# Patient Record
Sex: Female | Born: 1970 | Race: Black or African American | Hispanic: No | Smoking: Never smoker
Health system: Southern US, Community
[De-identification: ages and names within clinical notes are randomized; demographics above are authoritative.]

## PROBLEM LIST (undated history)

## (undated) DIAGNOSIS — M51369 Other intervertebral disc degeneration, lumbar region without mention of lumbar back pain or lower extremity pain: Secondary | ICD-10-CM

## (undated) DIAGNOSIS — R600 Localized edema: Secondary | ICD-10-CM

## (undated) DIAGNOSIS — R0600 Dyspnea, unspecified: Secondary | ICD-10-CM

## (undated) DIAGNOSIS — F329 Major depressive disorder, single episode, unspecified: Secondary | ICD-10-CM

## (undated) DIAGNOSIS — R0789 Other chest pain: Secondary | ICD-10-CM

## (undated) DIAGNOSIS — R030 Elevated blood-pressure reading, without diagnosis of hypertension: Secondary | ICD-10-CM

## (undated) DIAGNOSIS — J45909 Unspecified asthma, uncomplicated: Secondary | ICD-10-CM

## (undated) DIAGNOSIS — I10 Essential (primary) hypertension: Secondary | ICD-10-CM

## (undated) DIAGNOSIS — F32A Depression, unspecified: Secondary | ICD-10-CM

## (undated) DIAGNOSIS — I890 Lymphedema, not elsewhere classified: Secondary | ICD-10-CM

## (undated) HISTORY — DX: Unspecified asthma, uncomplicated: J45.909

## (undated) HISTORY — PX: MOUTH SURGERY: SHX715

## (undated) HISTORY — PX: TUBAL LIGATION: SHX77

## (undated) HISTORY — DX: Major depressive disorder, single episode, unspecified: F32.9

## (undated) HISTORY — DX: Depression, unspecified: F32.A

---

## 2003-11-05 ENCOUNTER — Ambulatory Visit: Payer: Self-pay | Admitting: Advanced Practice Midwife

## 2004-04-29 ENCOUNTER — Inpatient Hospital Stay: Payer: Self-pay | Admitting: Unknown Physician Specialty

## 2006-08-03 ENCOUNTER — Emergency Department: Payer: Self-pay | Admitting: Emergency Medicine

## 2006-11-11 ENCOUNTER — Encounter: Payer: Self-pay | Admitting: Maternal & Fetal Medicine

## 2006-11-11 IMAGING — US US OB DETAIL+14 WK - NRPT [facility]
1 series · 14 of 28 positions shown · non-contrast
Comparison: none

[Series 1: us ob detail+14 wk - nrpt [facility] · 0.35mm/px · 14 of 65 slices shown]
[im 3/65]
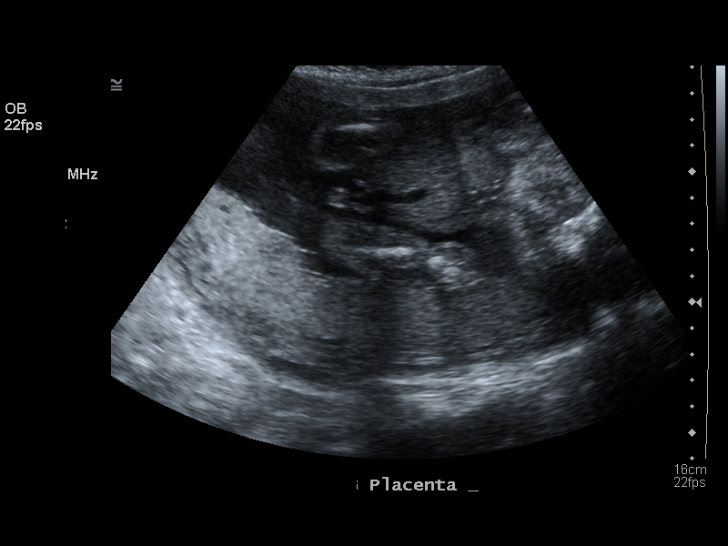
[im 8/65]
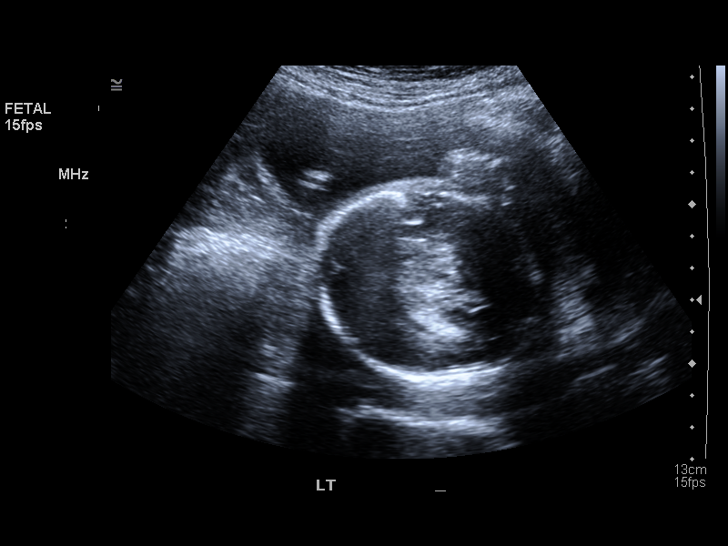
[im 12/65]
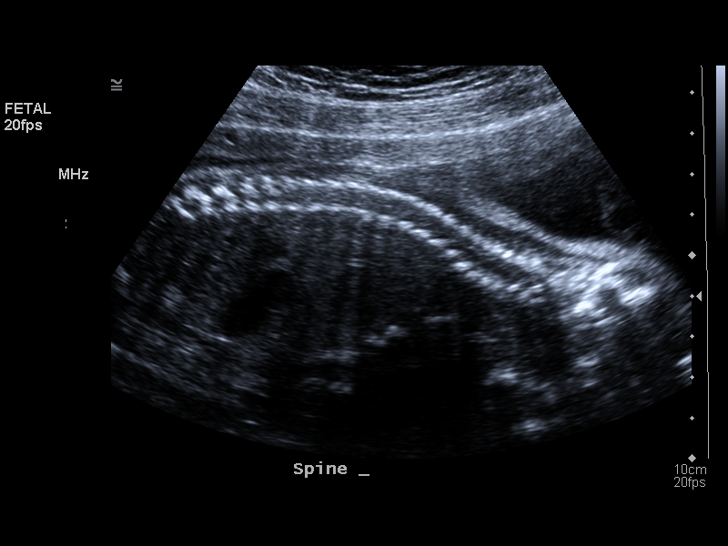
[im 17/65]
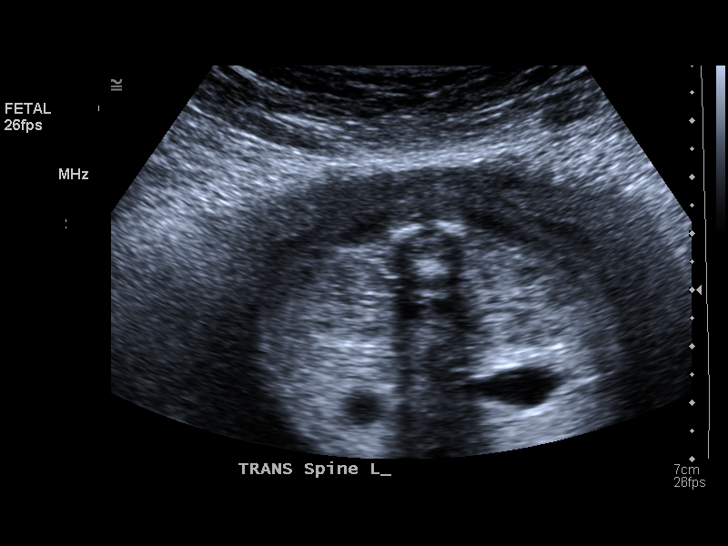
[im 22/65]
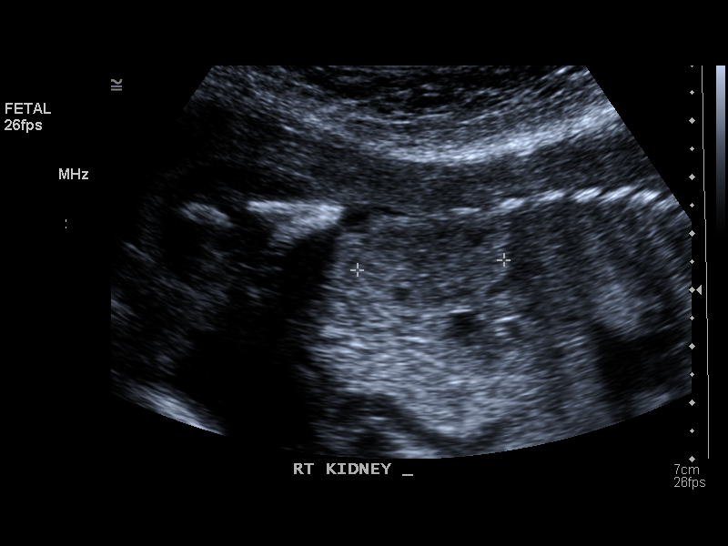
[im 27/65]
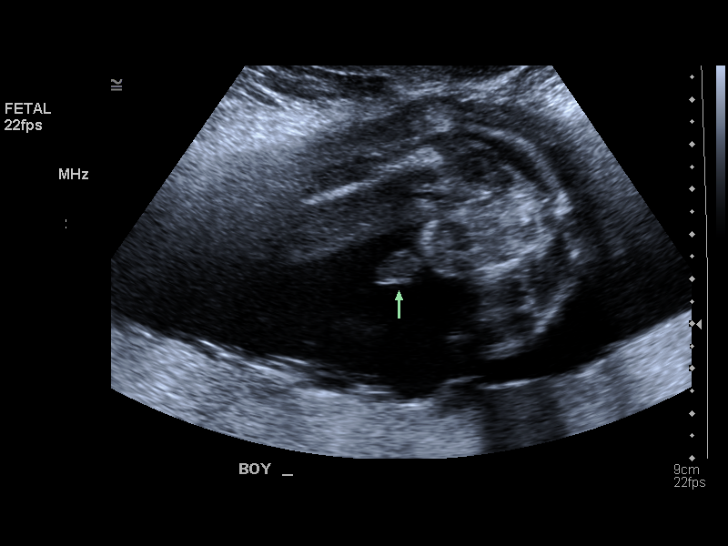
[im 31/65]
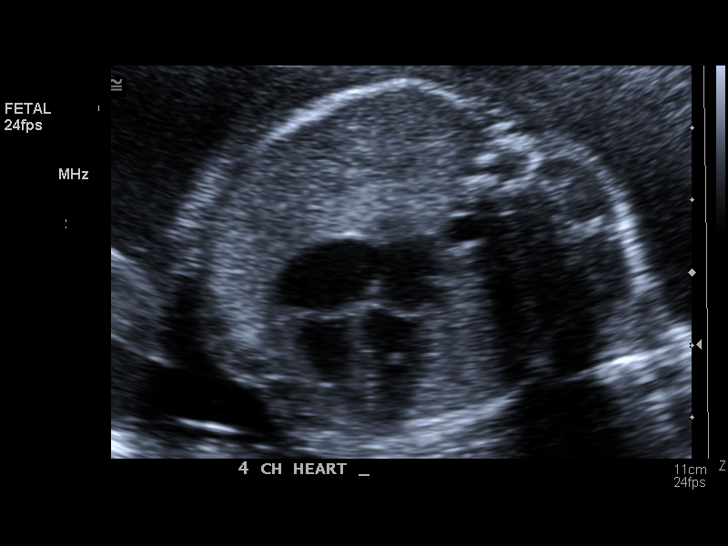
[im 36/65]
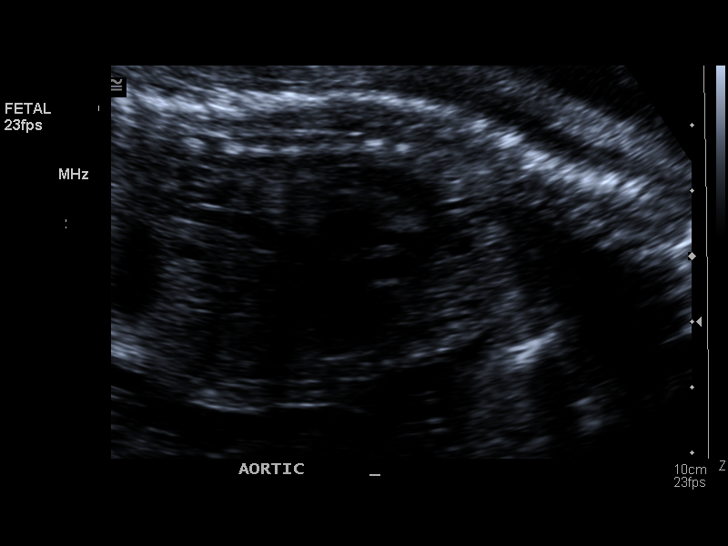
[im 41/65]
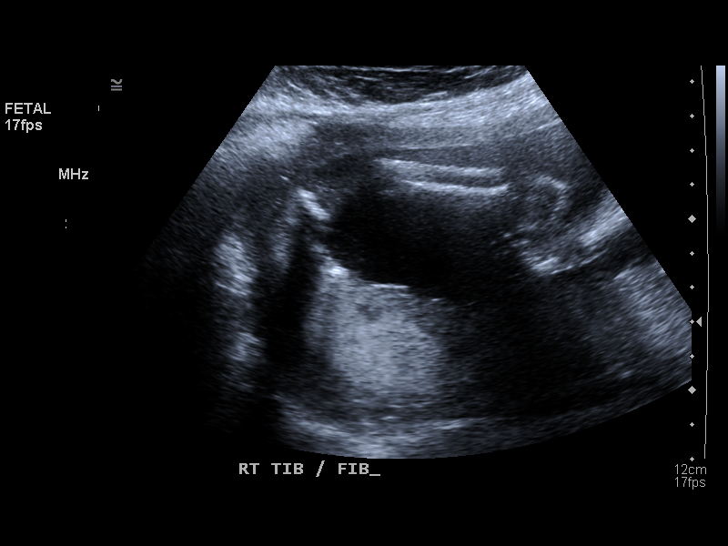
[im 46/65]
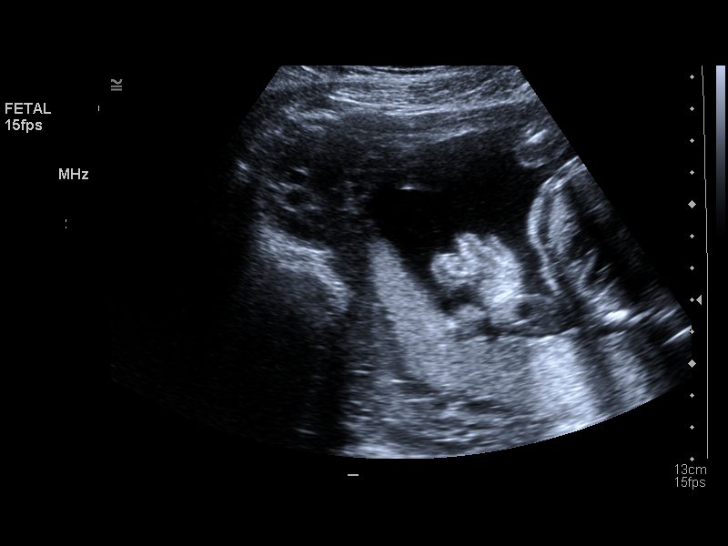
[im 50/65]
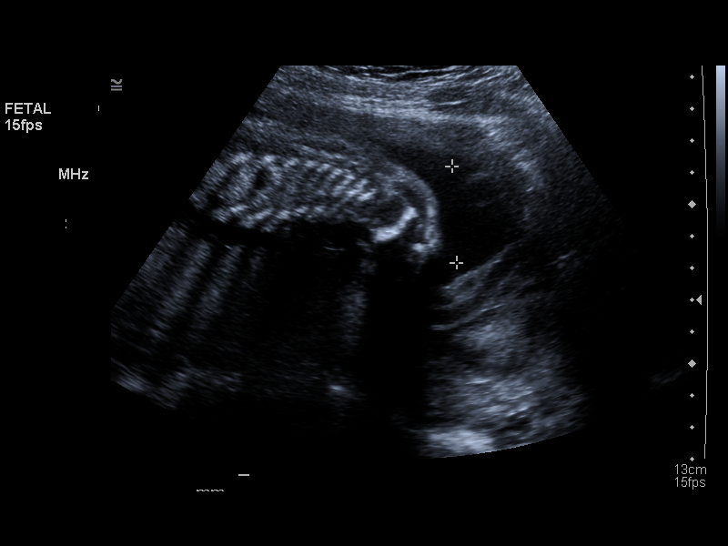
[im 55/65]
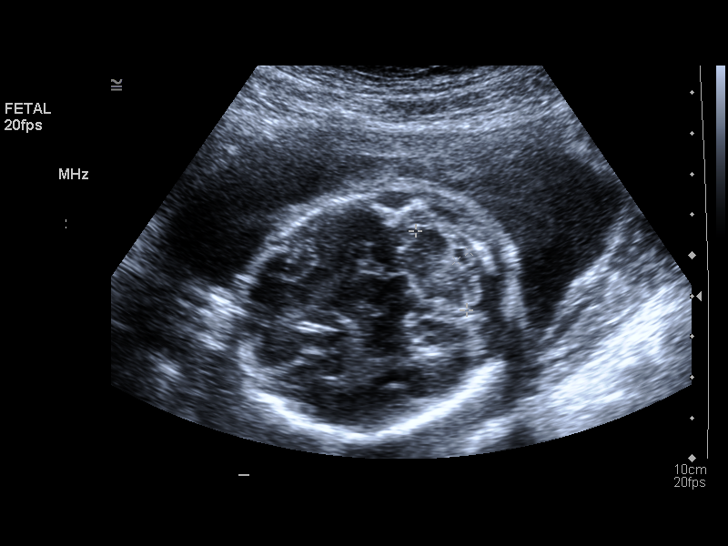
[im 60/65]
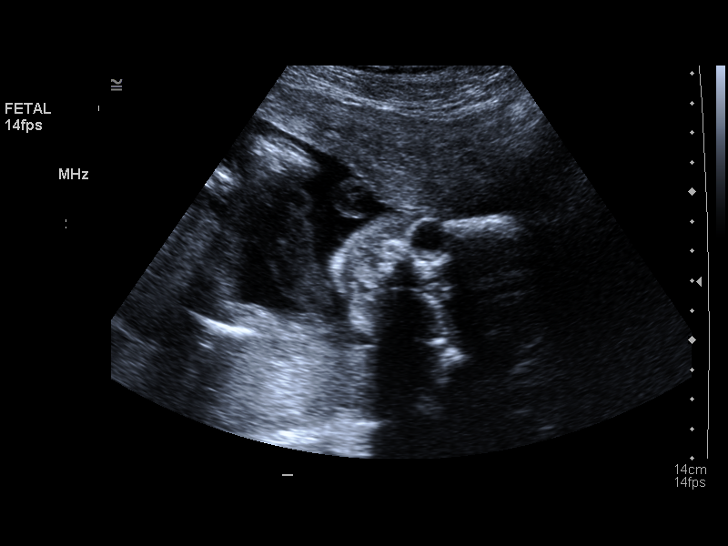
[im 65/65]
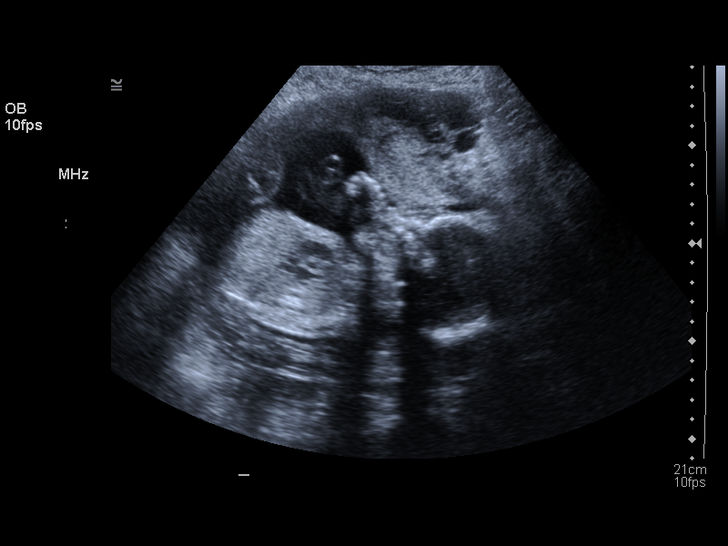

[14 of 28 positions shown; findings below may reference images not displayed]

IMAGES IMPORTED FROM THE SYNGO WORKFLOW SYSTEM
NO DICTATION FOR STUDY

## 2007-02-03 ENCOUNTER — Encounter: Payer: Self-pay | Admitting: Maternal & Fetal Medicine

## 2007-02-03 IMAGING — US ULTRAOUND OB LIMITED - NRPT [facility]
1 series · 14 of 28 positions shown · non-contrast
Comparison: none

[Series 1: ultraound ob limited - nrpt [facility] · 0.29mm/px · 14 of 35 slices shown]
[im 2/35]
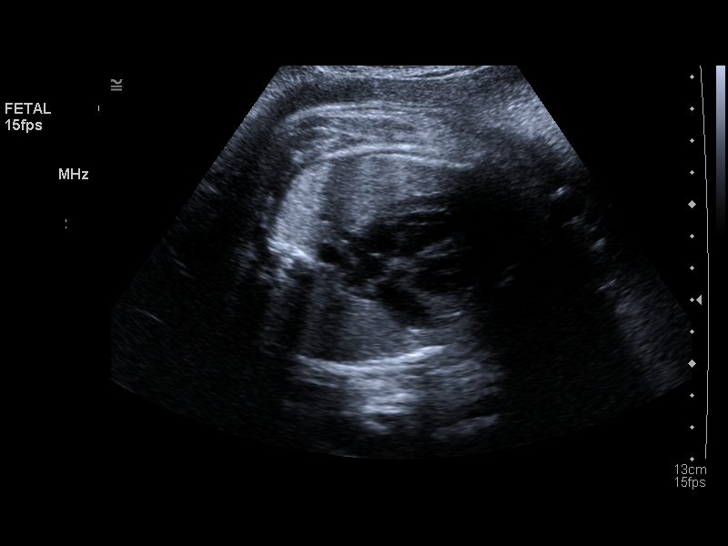
[im 4/35]
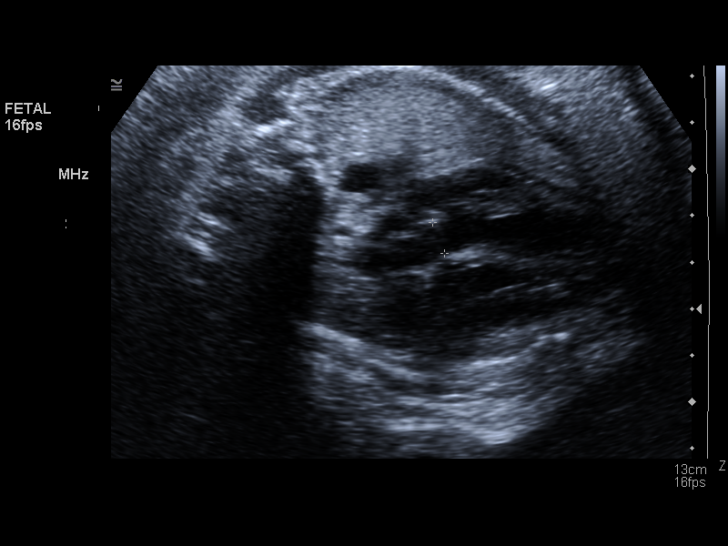
[im 7/35]
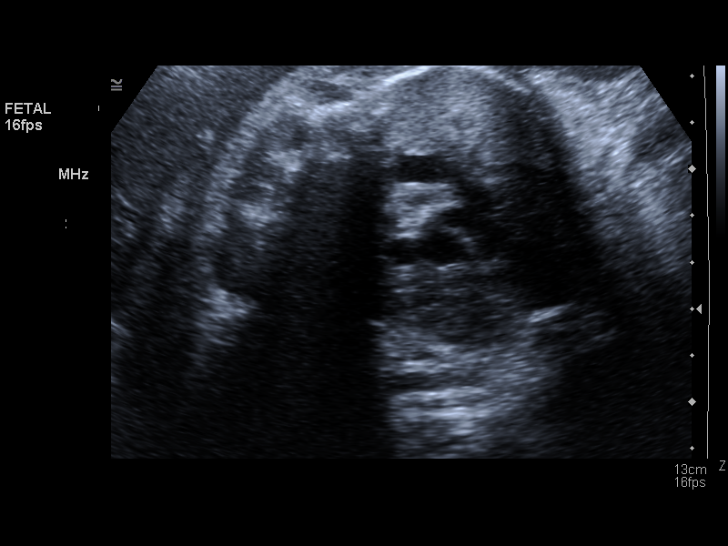
[im 9/35]
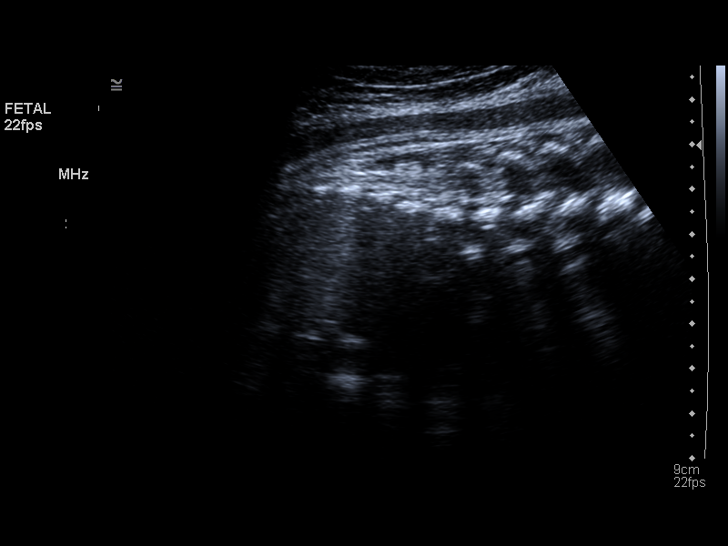
[im 12/35]
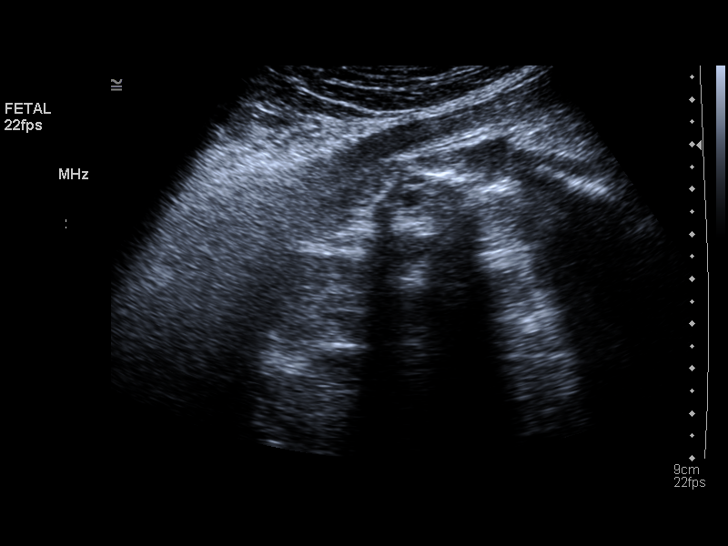
[im 14/35]
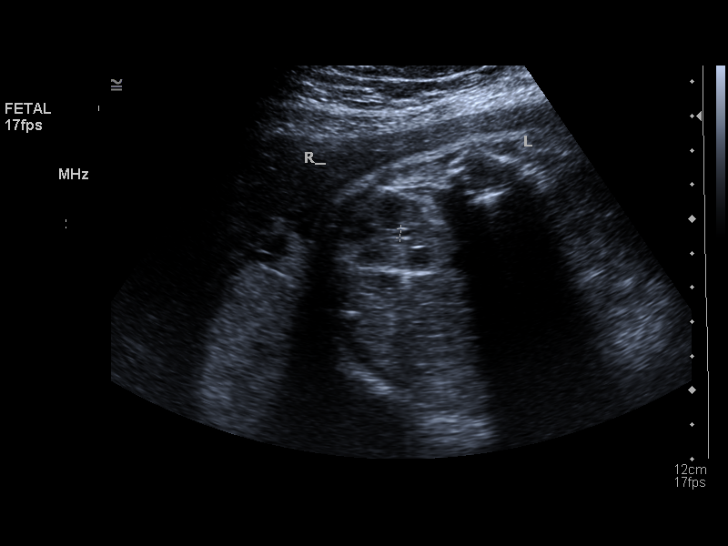
[im 17/35]
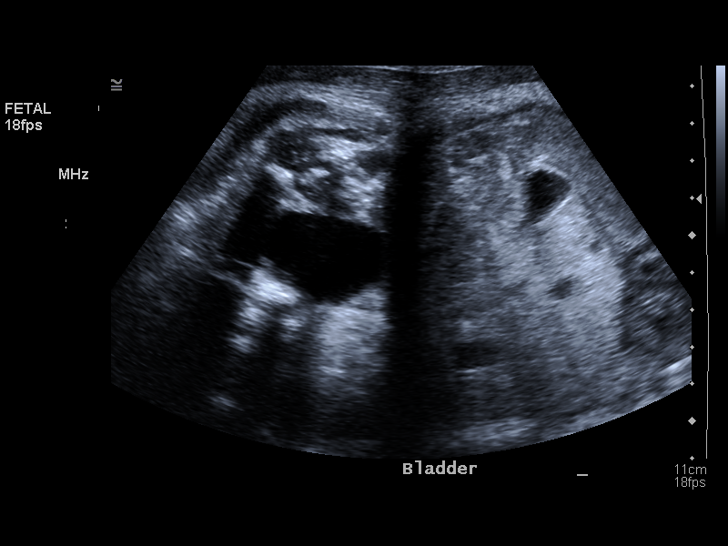
[im 19/35]
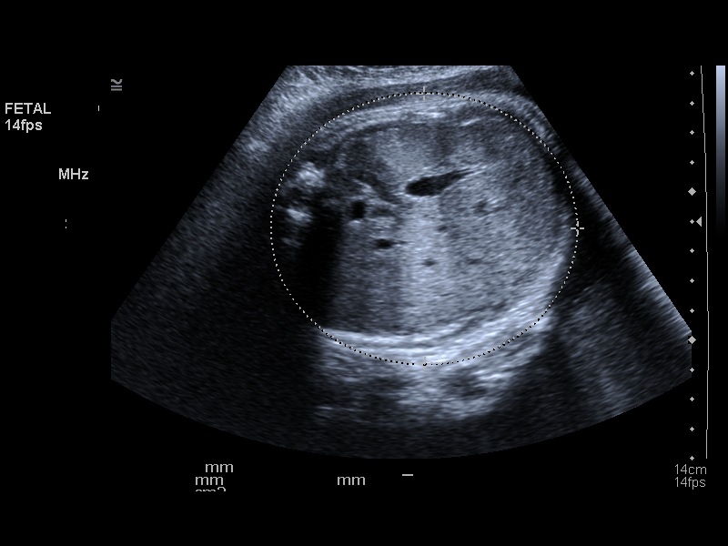
[im 22/35]
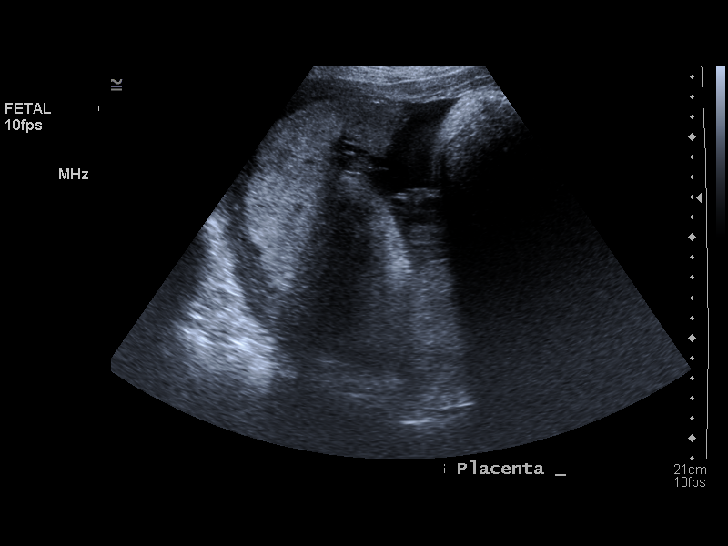
[im 24/35]
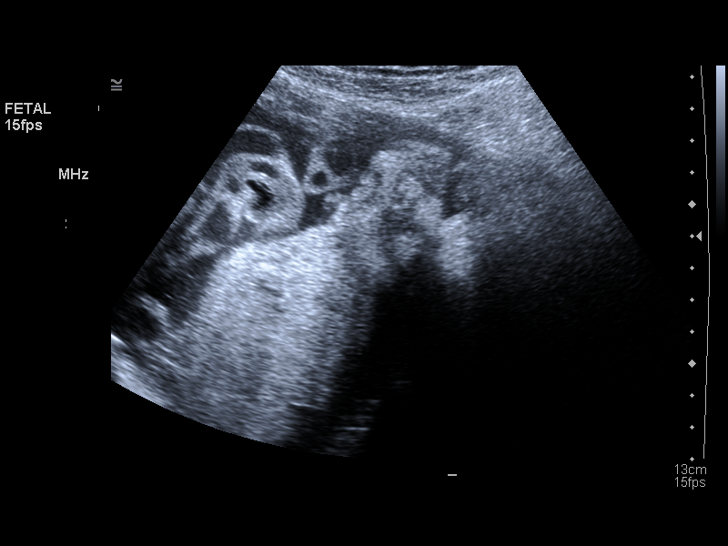
[im 27/35]
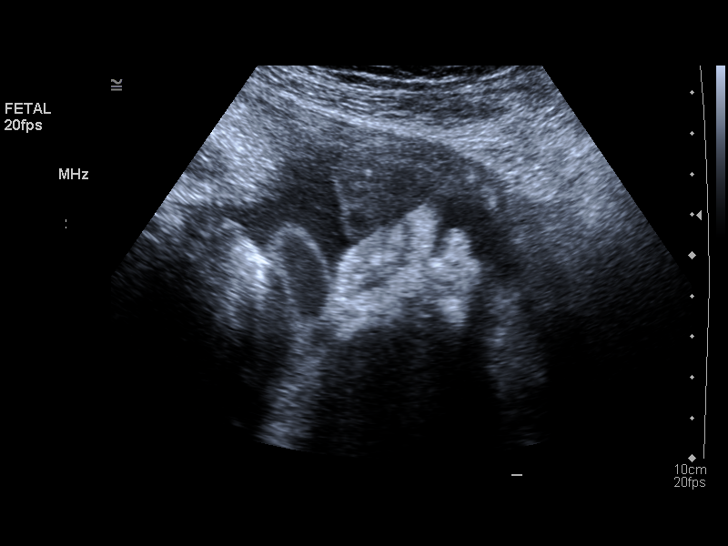
[im 29/35]
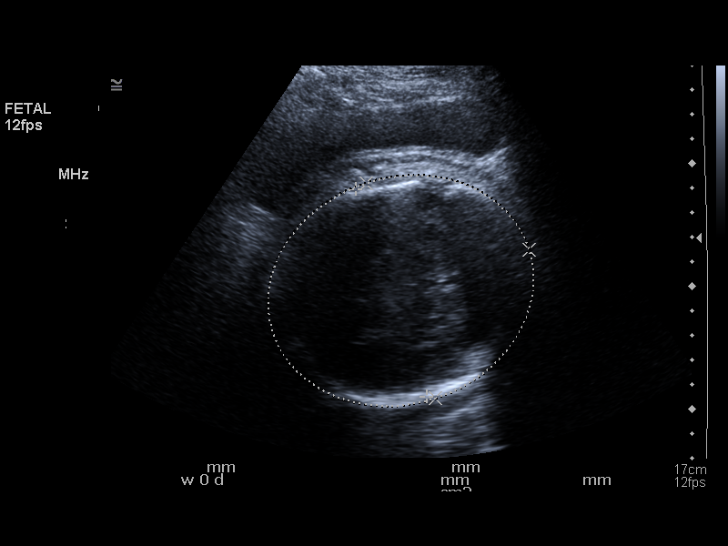
[im 32/35]
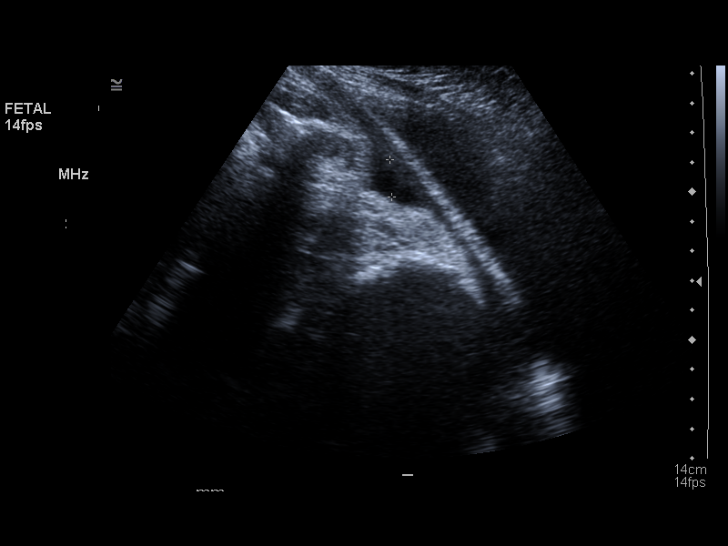
[im 35/35]
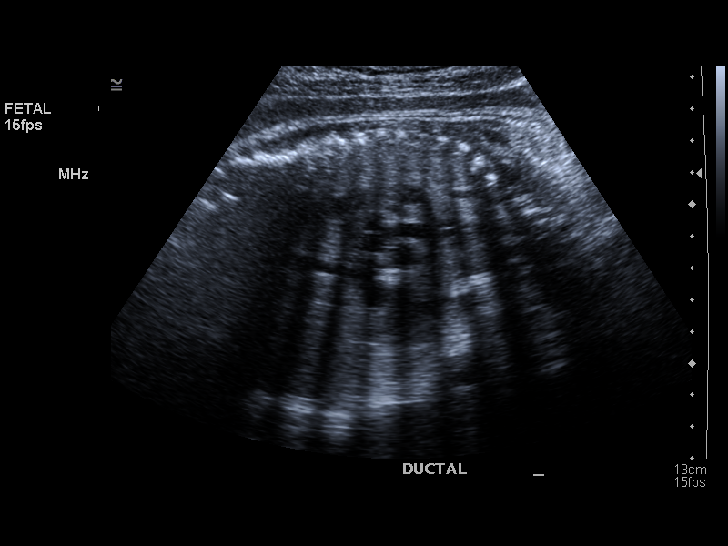

[14 of 28 positions shown; findings below may reference images not displayed]

IMAGES IMPORTED FROM THE SYNGO WORKFLOW SYSTEM
NO DICTATION FOR STUDY

## 2007-02-10 ENCOUNTER — Observation Stay: Payer: Self-pay | Admitting: Obstetrics & Gynecology

## 2007-02-10 ENCOUNTER — Encounter: Payer: Self-pay | Admitting: Maternal & Fetal Medicine

## 2007-02-10 IMAGING — US US FETAL BPP W/ NON-STRESS - NRPT
1 series · 7 of 7 positions shown · non-contrast
Comparison: none

[Series 1: us fetal bpp w/ non-stress - nrpt · 7 of 7 slices shown]
[im 1/7]
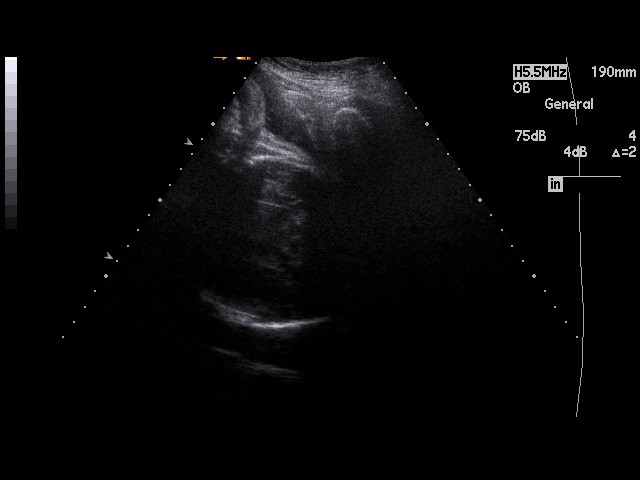
[im 2/7]
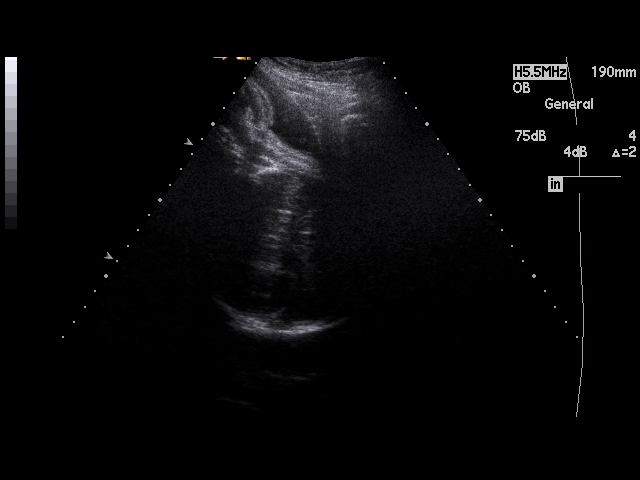
[im 3/7]
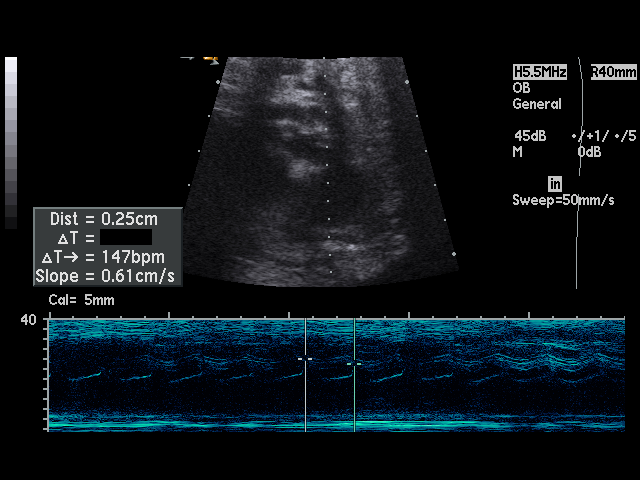
[im 4/7]
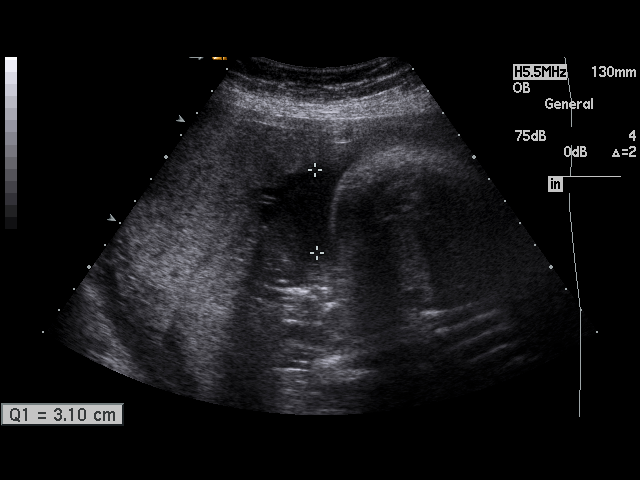
[im 5/7]
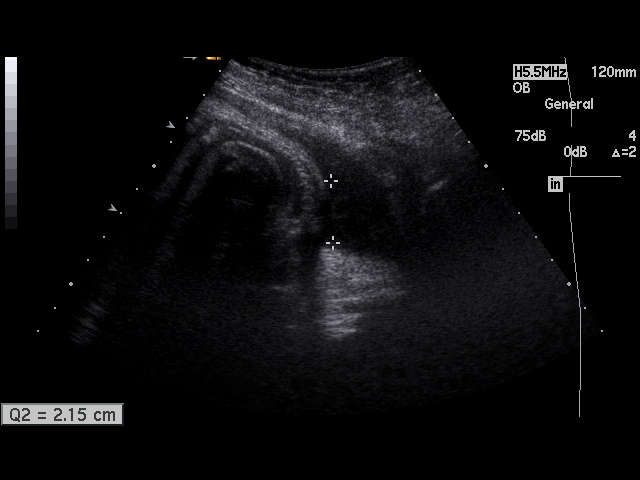
[im 6/7]
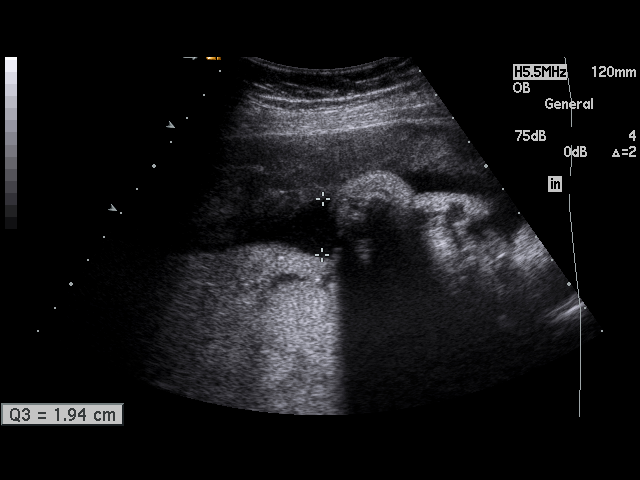
[im 7/7]
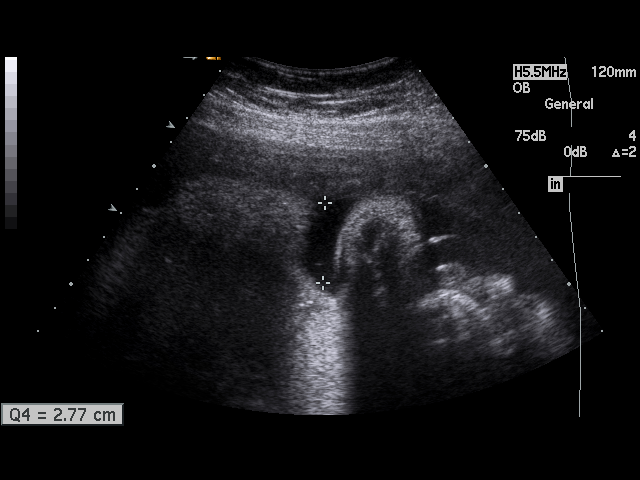

[7 of 7 positions shown; findings below may reference images not displayed]

IMAGES IMPORTED FROM THE SYNGO WORKFLOW SYSTEM
NO DICTATION FOR STUDY

## 2007-02-20 ENCOUNTER — Encounter: Payer: Self-pay | Admitting: Maternal & Fetal Medicine

## 2007-02-20 ENCOUNTER — Observation Stay: Payer: Self-pay | Admitting: Unknown Physician Specialty

## 2007-02-20 IMAGING — US ULTRAOUND OB LIMITED - [facility]PT [facility]
1 series · 14 of 28 positions shown · non-contrast
Comparison: none

[Series 1: ultraound ob limited - nrpt [facility] · 0.43mm/px · 14 of 28 slices shown]
[im 2/28]
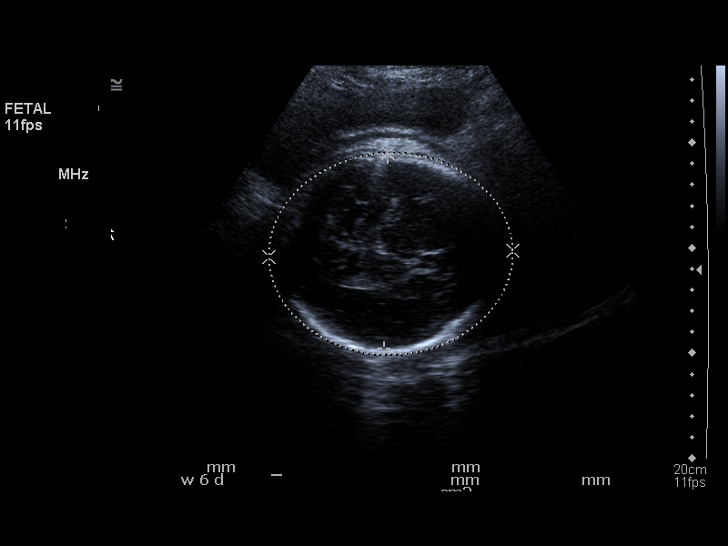
[im 4/28]
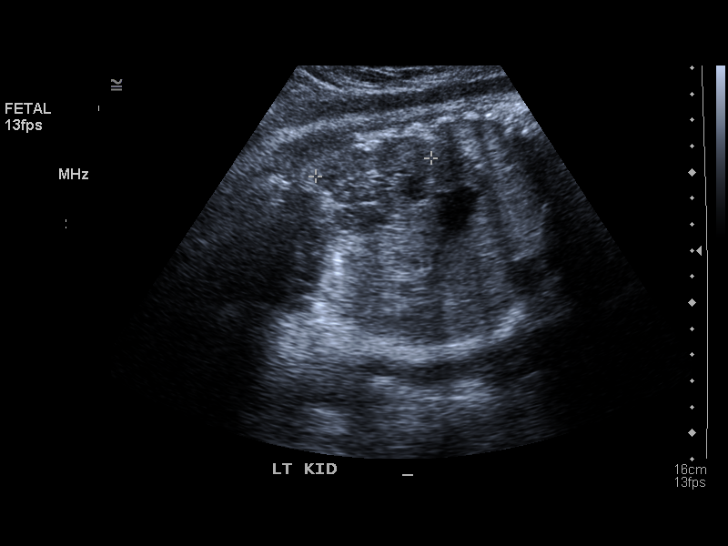
[im 6/28]
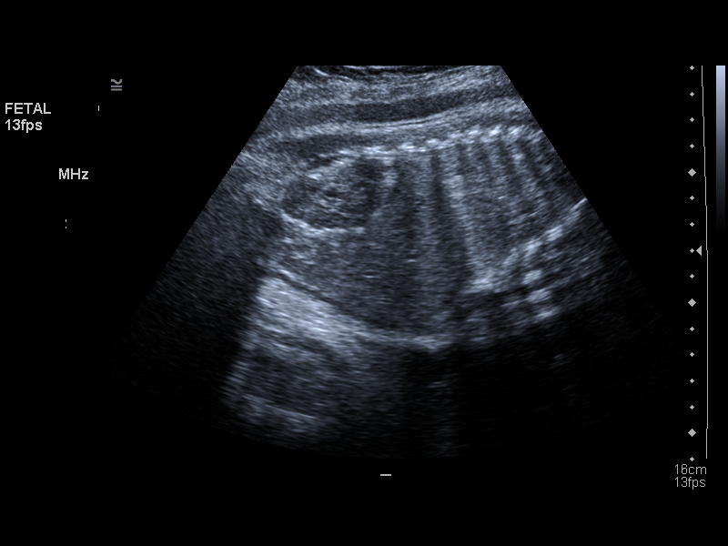
[im 8/28]
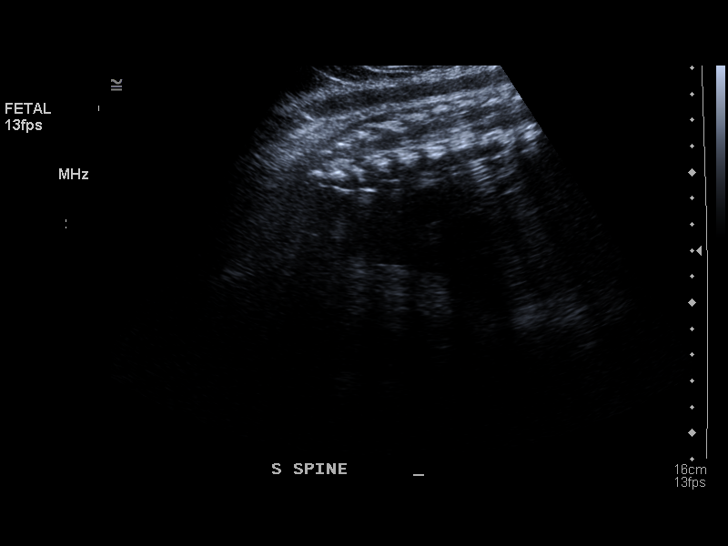
[im 10/28]
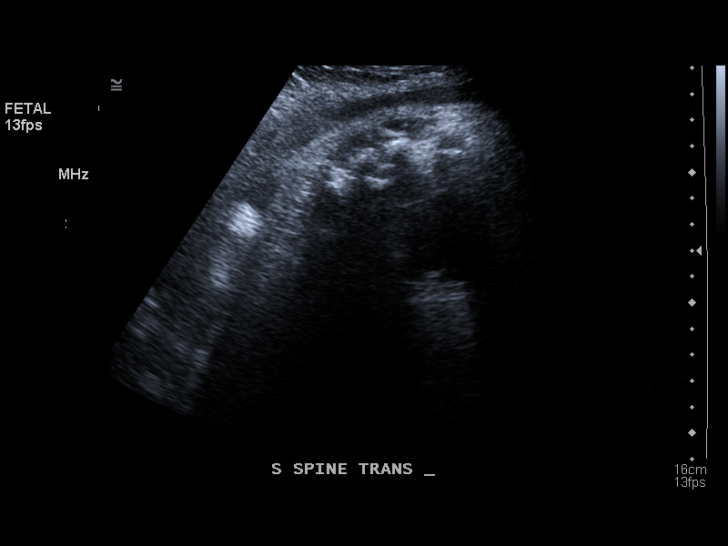
[im 12/28]
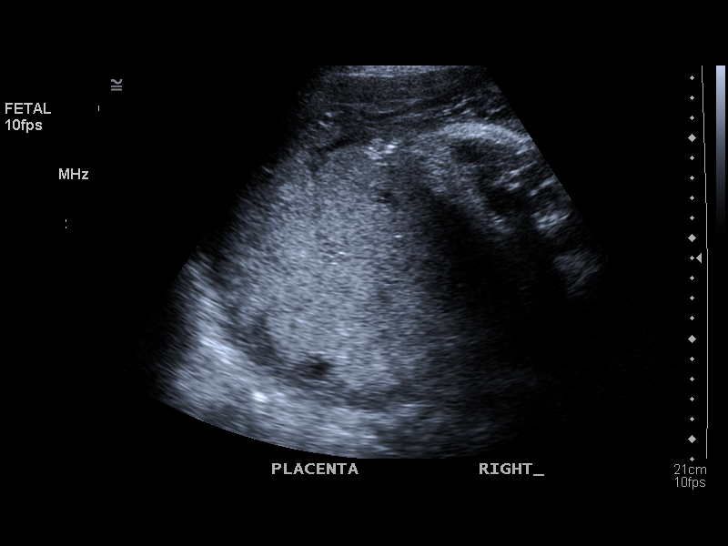
[im 14/28]
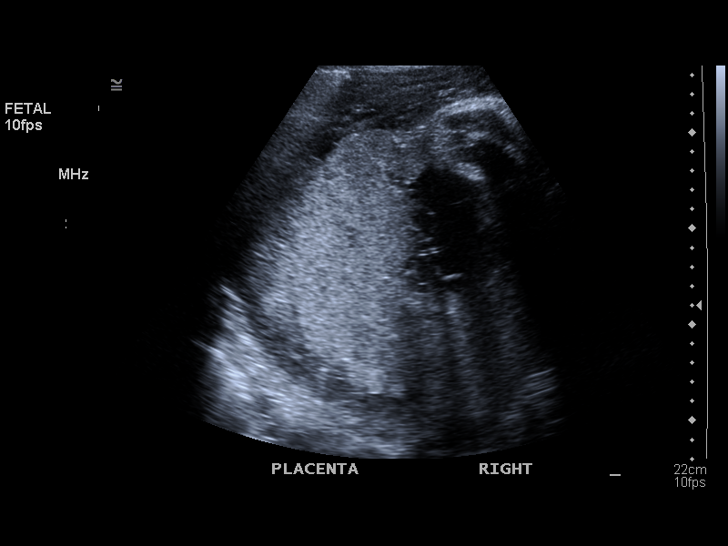
[im 16/28]
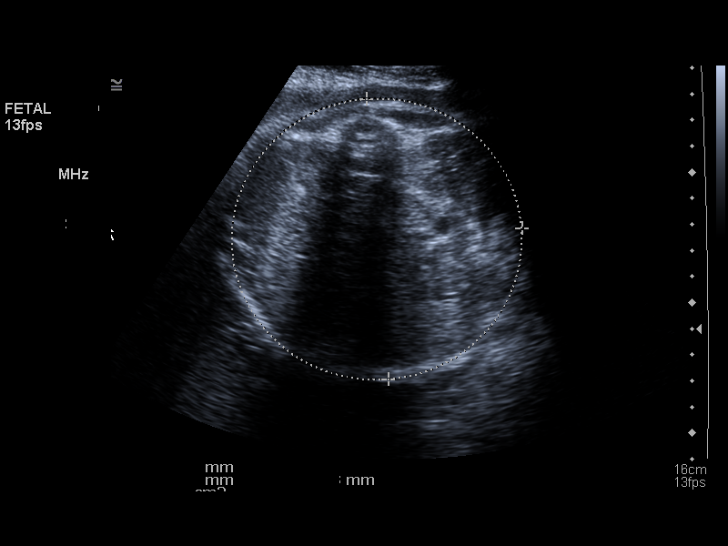
[im 18/28]
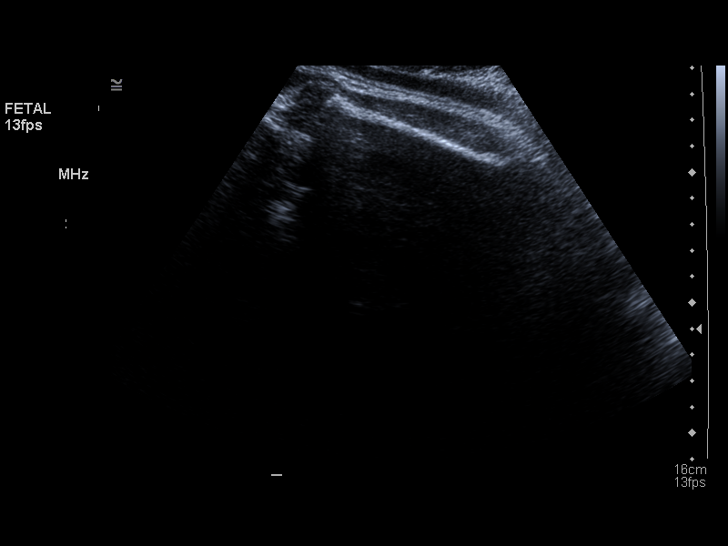
[im 20/28]
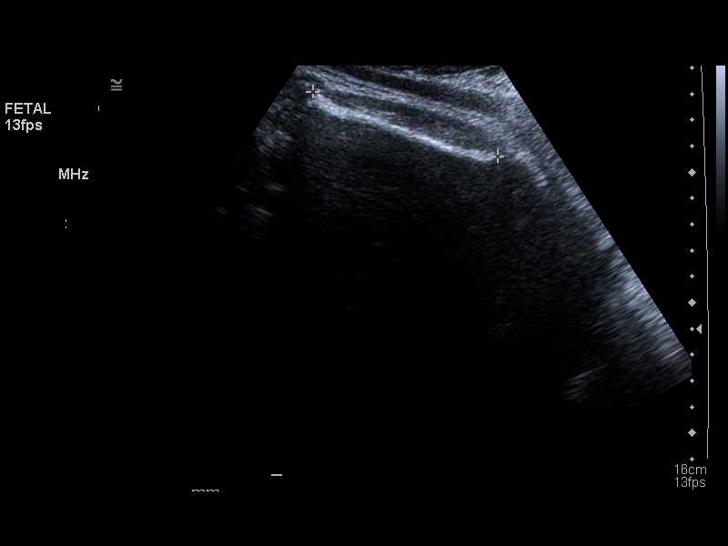
[im 22/28]
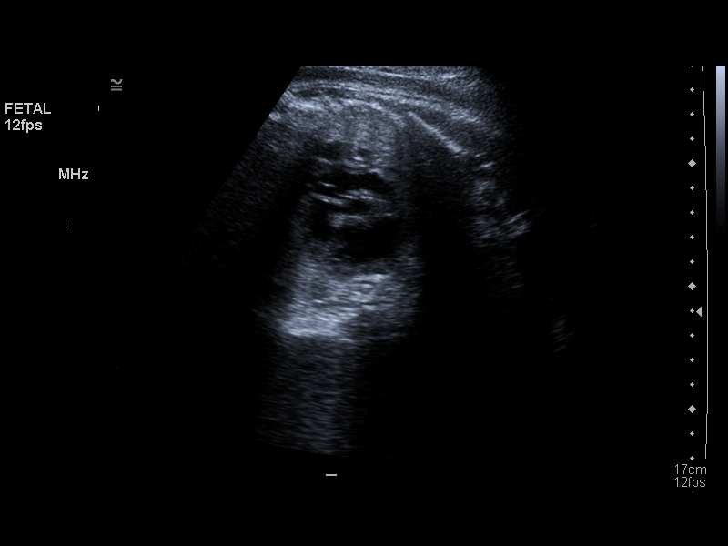
[im 24/28]
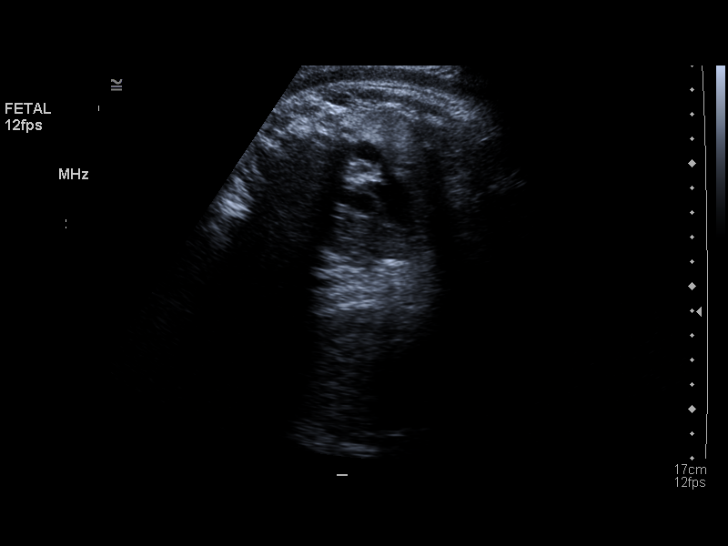
[im 26/28]
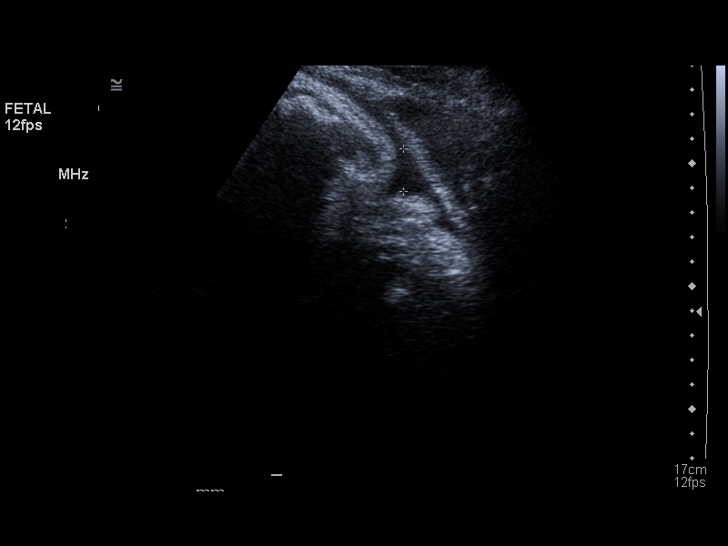
[im 28/28]
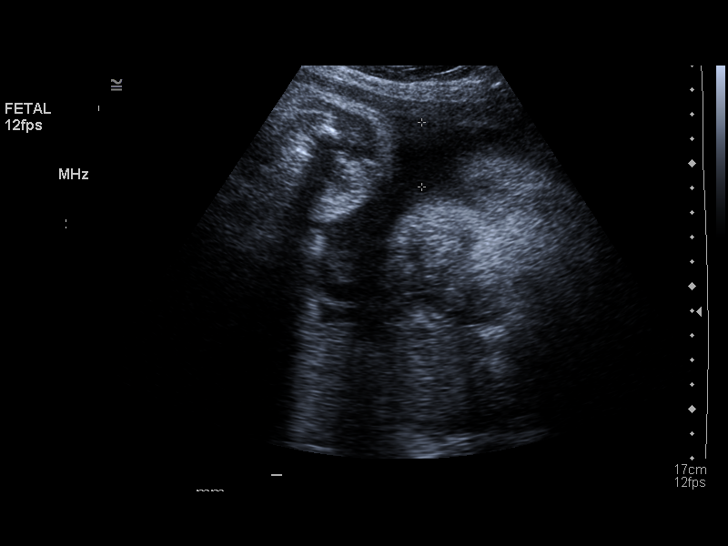

[14 of 28 positions shown; findings below may reference images not displayed]

IMAGES IMPORTED FROM THE SYNGO WORKFLOW SYSTEM
NO DICTATION FOR STUDY

## 2007-02-24 ENCOUNTER — Inpatient Hospital Stay: Payer: Self-pay | Admitting: Obstetrics and Gynecology

## 2017-07-19 ENCOUNTER — Other Ambulatory Visit: Payer: Self-pay | Admitting: Family Medicine

## 2017-07-19 DIAGNOSIS — Z1231 Encounter for screening mammogram for malignant neoplasm of breast: Secondary | ICD-10-CM

## 2017-08-19 ENCOUNTER — Ambulatory Visit (INDEPENDENT_AMBULATORY_CARE_PROVIDER_SITE_OTHER): Payer: Medicaid Other | Admitting: Vascular Surgery

## 2017-08-19 ENCOUNTER — Encounter (INDEPENDENT_AMBULATORY_CARE_PROVIDER_SITE_OTHER): Payer: Self-pay | Admitting: Vascular Surgery

## 2017-08-19 VITALS — BP 120/79 | HR 68 | Resp 17 | Ht 68.0 in | Wt 244.8 lb

## 2017-08-19 DIAGNOSIS — M79604 Pain in right leg: Secondary | ICD-10-CM | POA: Diagnosis not present

## 2017-08-19 DIAGNOSIS — R600 Localized edema: Secondary | ICD-10-CM

## 2017-08-19 DIAGNOSIS — M79605 Pain in left leg: Secondary | ICD-10-CM | POA: Diagnosis not present

## 2017-08-19 NOTE — Progress Notes (Signed)
Subjective:    Patient ID: Lindsey Chavez, female    DOB: 01/29/1970, 47 y.o.   MRN: [id number] Chief Complaint  Patient presents with  . New Patient (Initial Visit)    varicose veins   Presents as a new patient referred by the [facility] for evaluation of bilateral lower extremity pain and edema.  The patient notes a long-standing history of experiencing swelling to the lower legs.  States that this is been going on for almost "10 years".  The patient notes that her swelling is worse towards the end of the day or with sitting and standing for long periods of time the patient notes that the edema is associated with some discomfort especially towards the evening.  The patient does not engage in conservative therapy at this time including wearing medical grade 1 compression socks, elevating her legs and remaining active.  The patient also notes experiencing intermittent claudication-like symptoms to the bilateral lower extremity with ambulation.  The patient denies any rest pain or ulcer formation to the bilateral lower extremity.  The patient denies any recent surgery or trauma to the bilateral legs.  The patient denies any DVT history or recurrent bouts of cellulitis.  The patient denies any fever, nausea vomiting.  Review of Systems  Constitutional: Negative.   HENT: Negative.   Eyes: Negative.   Respiratory: Negative.   Cardiovascular: Positive for leg swelling.       Lower extremity pain  Gastrointestinal: Negative.   Endocrine: Negative.   Genitourinary: Negative.   Musculoskeletal: Negative.   Skin: Negative.   Allergic/Immunologic: Negative.   Neurological: Negative.   Hematological: Negative.   Psychiatric/Behavioral: Negative.       Objective:   Physical Exam  Constitutional: She is oriented to person, place, and time. She appears well-developed and well-nourished. No distress.  HENT:  Head: Normocephalic and atraumatic.  Right Ear: External ear normal.  Left  Ear: External ear normal.  Eyes: Pupils are equal, round, and reactive to light. Conjunctivae and EOM are normal.  Neck: Normal range of motion.  Cardiovascular: Normal rate, regular rhythm and normal heart sounds.  Pulses:      Radial pulses are 2+ on the right side, and 2+ on the left side.  Hard to palpate pedal pulses on exam however the bilateral feet are warm  Pulmonary/Chest: Effort normal and breath sounds normal.  Musculoskeletal: Normal range of motion. She exhibits edema (Mild nonpitting edema noted bilaterally).  Neurological: She is alert and oriented to person, place, and time.  Skin: Skin is warm and dry. She is not diaphoretic.  Scattered less than 1 cm varicosities noted to the bilateral legs.  There is no stasis dermatitis, fibrosis, so cellulitis or active ulcerations noted.  Psychiatric: She has a normal mood and affect. Her behavior is normal. Judgment and thought content normal.  Vitals reviewed.  BP 120/79 (BP Location: Right Arm)   Pulse 68   Resp 17   Ht 5\' 8"  (1.727 m)   Wt 244 lb 12.8 oz (111 kg)   BMI 37.22 kg/m   Past Medical History:  Diagnosis Date  . Asthma   . Depressive disorder    Social History   Socioeconomic History  . Marital status: Single    Spouse name: Not on file  . Number of children: Not on file  . Years of education: Not on file  . Highest education level: Not on file  Occupational History  . Not on file  Social Needs  .  Financial resource strain: Not on file  . Food insecurity:    Worry: Not on file    Inability: Not on file  . Transportation needs:    Medical: Not on file    Non-medical: Not on file  Tobacco Use  . Smoking status: Never Smoker  . Smokeless tobacco: Never Used  Substance and Sexual Activity  . Alcohol use: Not Currently  . Drug use: Never  . Sexual activity: Not on file  Lifestyle  . Physical activity:    Days per week: Not on file    Minutes per session: Not on file  . Stress: Not on file    Relationships  . Social connections:    Talks on phone: Not on file    Gets together: Not on file    Attends religious service: Not on file    Active member of club or organization: Not on file    Attends meetings of clubs or organizations: Not on file    Relationship status: Not on file  . Intimate partner violence:    Fear of current or ex partner: Not on file    Emotionally abused: Not on file    Physically abused: Not on file    Forced sexual activity: Not on file  Other Topics Concern  . Not on file  Social History Narrative  . Not on file   Family History  Problem Relation Age of Onset  . Arthritis Mother   . Hypertension Mother   . Hyperlipidemia Mother   . Diabetes Father   . Breast cancer Sister    No Known Allergies     Assessment & Plan:  Presents as a new patient referred by the [facility] for evaluation of bilateral lower extremity pain and edema.  The patient notes a long-standing history of experiencing swelling to the lower legs.  States that this is been going on for almost "10 years".  The patient notes that her swelling is worse towards the end of the day or with sitting and standing for long periods of time the patient notes that the edema is associated with some discomfort especially towards the evening.  The patient does not engage in conservative therapy at this time including wearing medical grade 1 compression socks, elevating her legs and remaining active.  The patient also notes experiencing intermittent claudication-like symptoms to the bilateral lower extremity with ambulation.  The patient denies any rest pain or ulcer formation to the bilateral lower extremity.  The patient denies any recent surgery or trauma to the bilateral legs.  The patient denies any DVT history or recurrent bouts of cellulitis.  The patient denies any fever, nausea vomiting.  1. Pain in both lower extremities - New Patient presents with intermittent claudication-like  symptoms to the bilateral lower extremity with activity Hard to palpate pedal pulses on exam Bring the patient back and have her undergo bilateral ABI to rule out any contributing peripheral artery disease I have discussed with the patient at length the risk factors for and pathogenesis of atherosclerotic disease and encouraged a healthy diet, regular exercise regimen and blood pressure / glucose control.  The patient was encouraged to call the office in the interim if he experiences any claudication like symptoms, rest pain or ulcers to his feet / toes.  - VAS [place] ABI WITH/WO TBI; Future  2. Bilateral lower extremity edema - New The patient was encouraged to wear graduated compression stockings (20-30 mmHg) on a daily basis. The patient  was instructed to begin wearing the stockings first thing in the morning and removing them in the evening. The patient was instructed specifically not to sleep in the stockings. Prescription given.  In addition, behavioral modification including elevation during the day will be initiated. Anti-inflammatories for pain. We will bring the patient back and have her undergo bilateral lower extremity venous duplex to rule out any venous versus lymphatic disease.   The patient will follow up in three months to asses conservative management.  Information on chronic venous insufficiency and compression stockings was given to the patient. The patient was instructed to call the office in the interim if any worsening edema or ulcerations to the legs, feet or toes occurs. The patient expresses their understanding.  - VAS US LOWER EXTREMITY VENOUS REFLUX; Future  Current Outpatient Medications on File Prior to Visit  Medication Sig Dispense Refill  . escitalopram (LEXAPRO) 10 MG tablet TAKE 1 TABLET BY MOUTH ONCE DAILY FOR MOOD  2  . PROAIR HFA 108 (90 Base) MCG/ACT inhaler INHALE 2 PUFFS INTO LUNGS EVERY 4 TO 6 HOURS AS NEEDED FOR WHEEZING  5   No current  facility-administered medications on file prior to visit.    There are no Patient Instructions on file for this visit. No follow-ups on file.  Josslyn Ciolek A Charlies Rayburn, PA-C

## 2017-09-16 ENCOUNTER — Ambulatory Visit
Admission: RE | Admit: 2017-09-16 | Discharge: 2017-09-16 | Disposition: A | Discharge status: Home / Self Care | Payer: Medicaid Other | Source: Ambulatory Visit | Attending: Family Medicine | Admitting: Family Medicine

## 2017-09-16 ENCOUNTER — Encounter: Payer: Self-pay | Admitting: Radiology

## 2017-09-16 DIAGNOSIS — Z1231 Encounter for screening mammogram for malignant neoplasm of breast: Secondary | ICD-10-CM | POA: Diagnosis present

## 2017-09-16 IMAGING — MG MM DIGITAL SCREENING BILAT W/ TOMO W/ CAD
8 of 13 series · 8 of 37 positions shown · non-contrast
Comparison: None.

CLINICAL DATA: Screening. Baseline.

EXAM:
DIGITAL SCREENING BILATERAL MAMMOGRAM WITH TOMO AND CAD

[R CC synth-2D (1 of 2)]
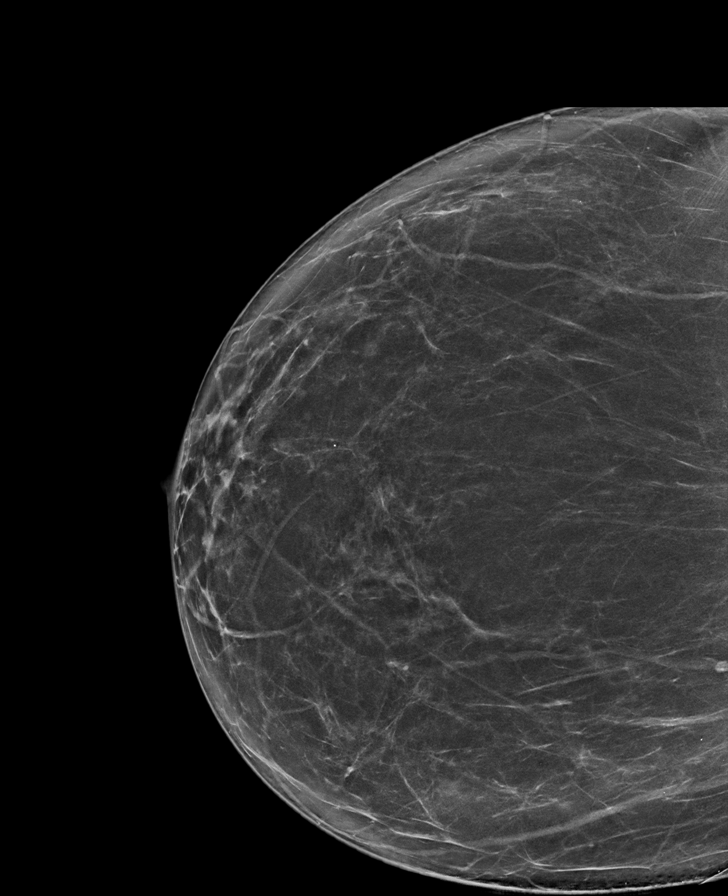

[L MLO synth-2D (1 of 2)]
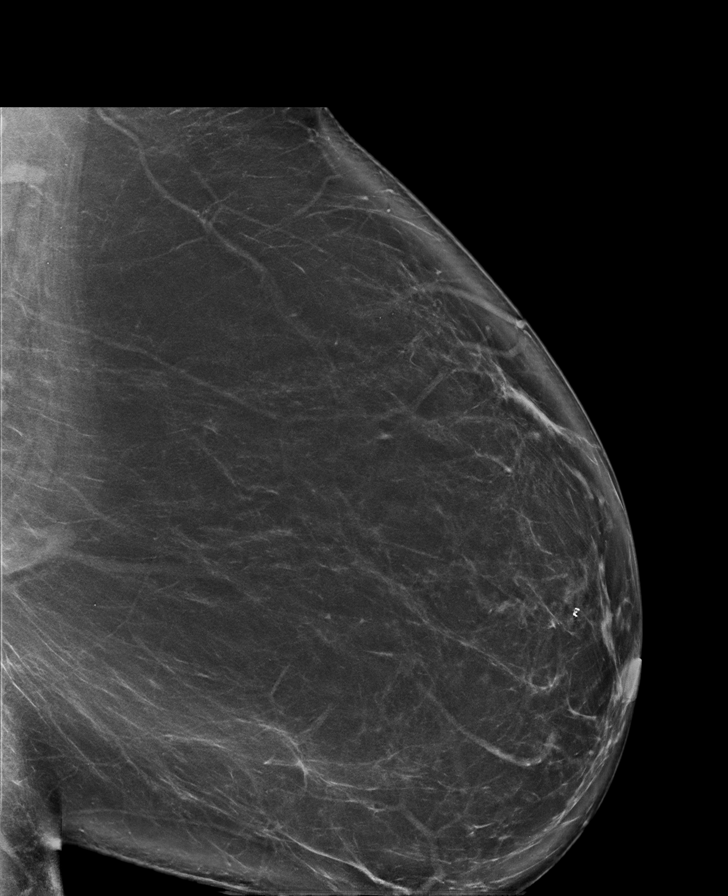

[R CC synth-2D (2 of 2)]
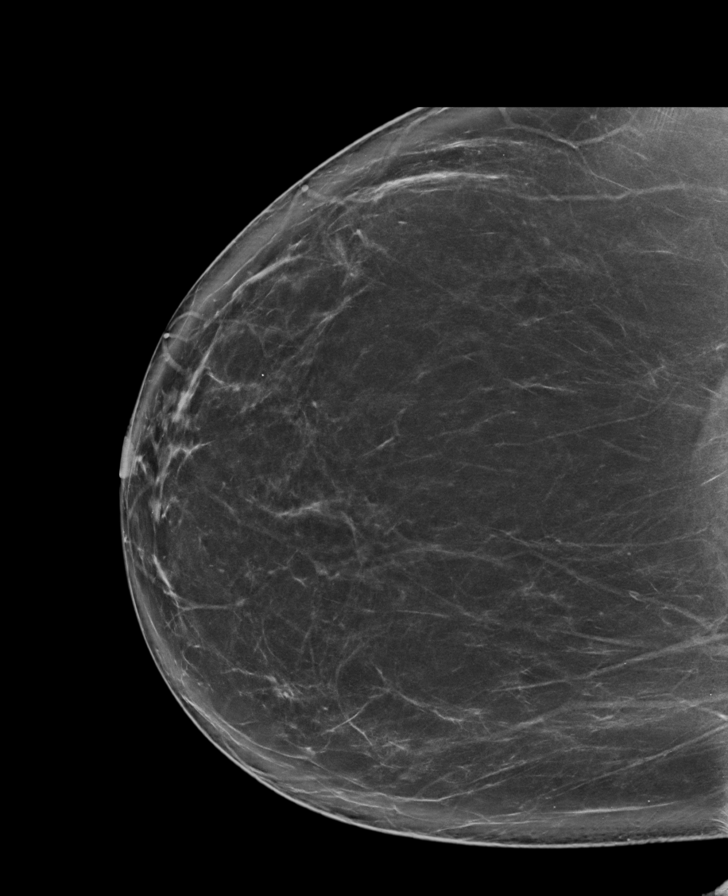

[R MLO synth-2D]
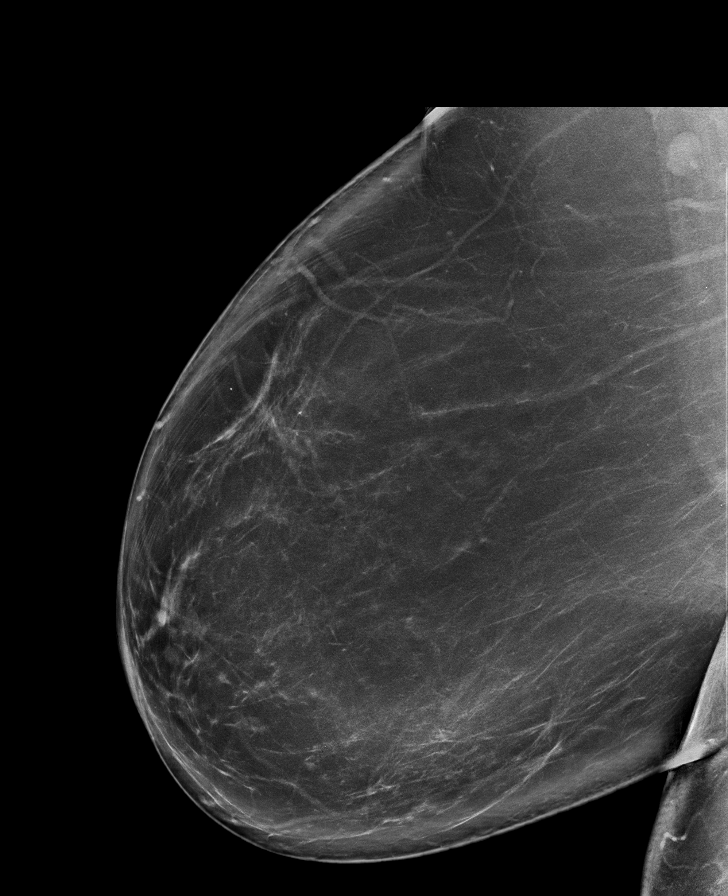

[L CC synth-2D (1 of 2)]
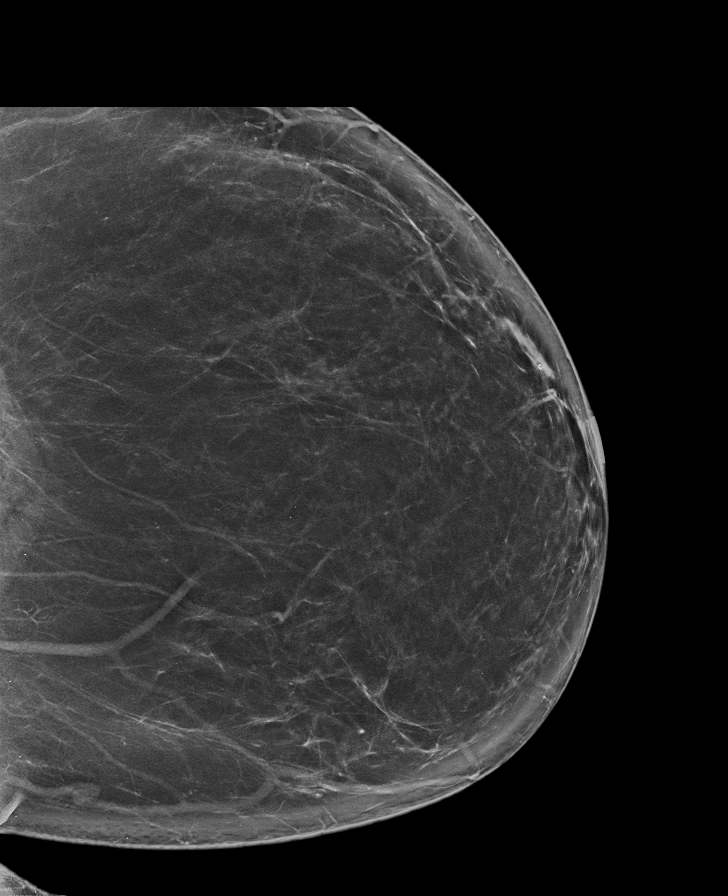

[L MLO synth-2D (2 of 2)]
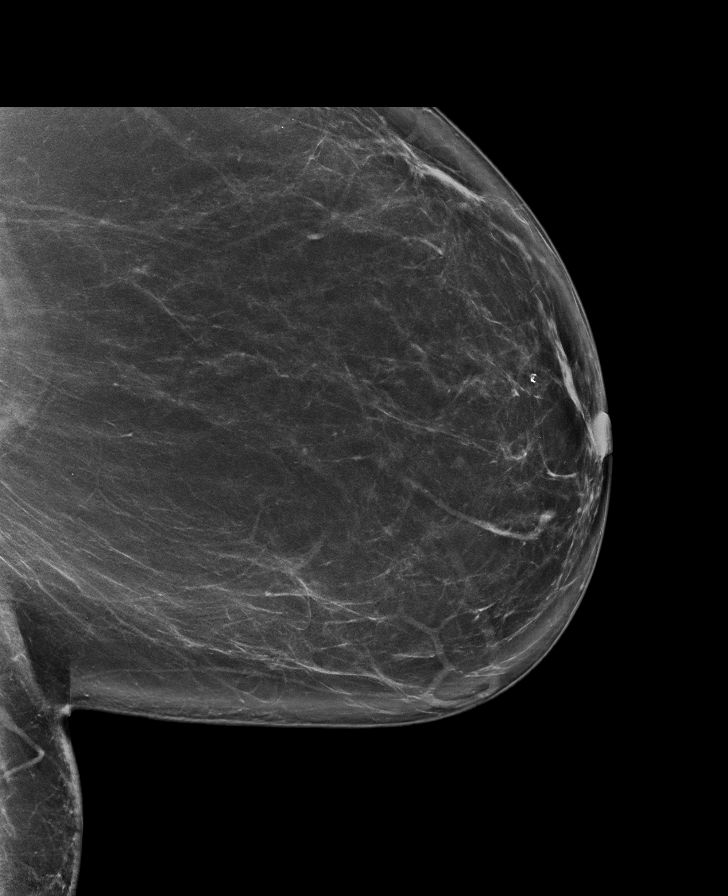

[L CC synth-2D (2 of 2)]
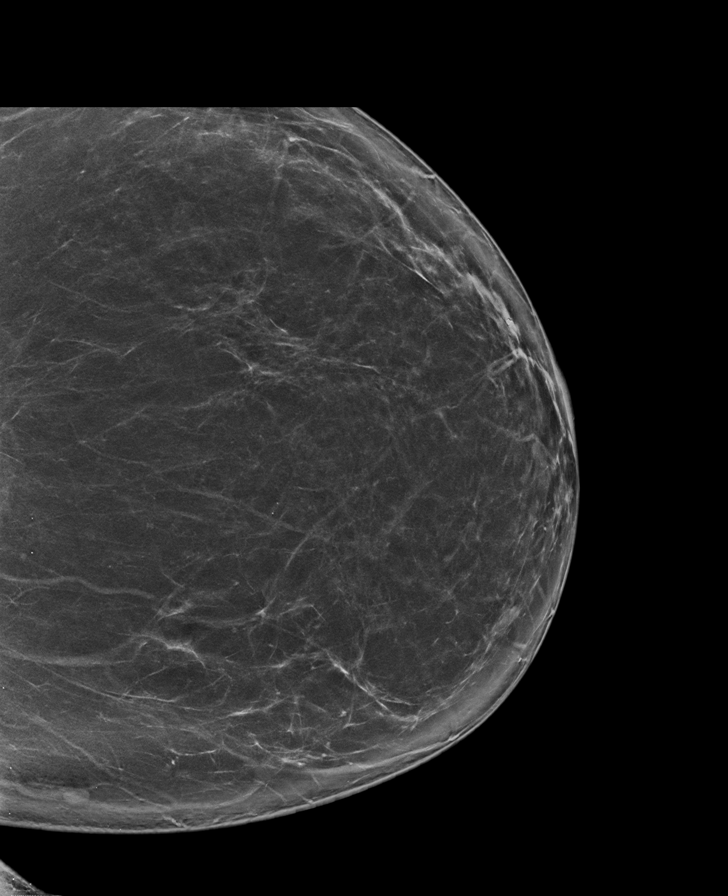

[R CC tomo · tomo slice 44/87.0]
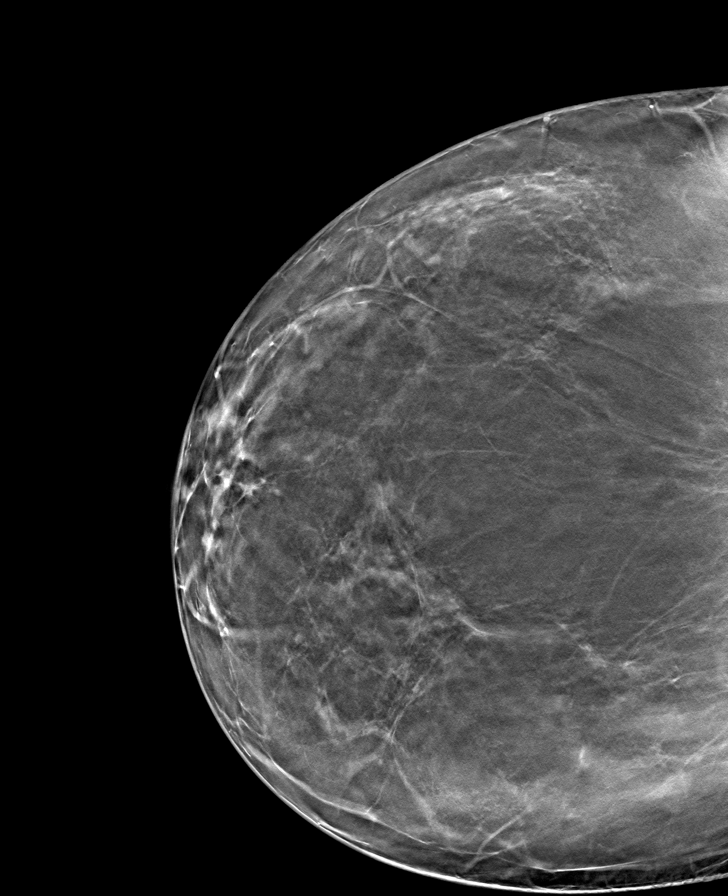

[8 of 37 positions shown; findings below may reference images not displayed]

ACR Breast Density Category b: There are scattered areas of
fibroglandular density.
FINDINGS: There are no findings suspicious for malignancy. Images were
processed with CAD.
IMPRESSION: No mammographic evidence of malignancy. A result letter of this
screening mammogram will be mailed directly to the patient.

RECOMMENDATION:
Screening mammogram in one year. (Code:[K7])

BI-RADS CATEGORY  1: Negative.

## 2017-12-19 ENCOUNTER — Ambulatory Visit (INDEPENDENT_AMBULATORY_CARE_PROVIDER_SITE_OTHER): Payer: Medicaid Other | Admitting: Vascular Surgery

## 2017-12-19 ENCOUNTER — Ambulatory Visit (INDEPENDENT_AMBULATORY_CARE_PROVIDER_SITE_OTHER): Payer: Medicaid Other

## 2017-12-19 ENCOUNTER — Encounter

## 2017-12-19 ENCOUNTER — Encounter (INDEPENDENT_AMBULATORY_CARE_PROVIDER_SITE_OTHER): Payer: Self-pay | Admitting: Vascular Surgery

## 2017-12-19 VITALS — BP 138/84 | HR 77 | Ht 68.0 in | Wt 247.0 lb

## 2017-12-19 DIAGNOSIS — M79605 Pain in left leg: Secondary | ICD-10-CM

## 2017-12-19 DIAGNOSIS — R600 Localized edema: Secondary | ICD-10-CM

## 2017-12-19 DIAGNOSIS — M79604 Pain in right leg: Secondary | ICD-10-CM

## 2017-12-19 DIAGNOSIS — I890 Lymphedema, not elsewhere classified: Secondary | ICD-10-CM | POA: Diagnosis not present

## 2017-12-19 NOTE — Progress Notes (Signed)
MRN : [id number]  Waymond Ceraatricia Raptis is a 47 y.o. (Jan 06, 1971) female who presents with chief complaint of No chief complaint on file. Marland Kitchen.  History of Present Illness:   The patient returns to the office for followup evaluation regarding leg swelling.  The swelling has persisted and the pain associated with swelling continues. There have not been any interval development of a ulcerations or wounds.  Since the previous visit the patient has been wearing graduated compression stockings and has noted little if any improvement in the lymphedema. The patient has been using compression routinely morning until night.  The patient also states elevation during the day and exercise is being done too.  ABI's are normal bilaterally Venous duplex is normal deep system and no reflux deep or superficial.   No outpatient medications have been marked as taking for the 12/19/17 encounter (Appointment) with Gilda CreaseSchnier, Latina CraverGregory G, MD.    Past Medical History:  Diagnosis Date  . Asthma   . Depressive disorder     Past Surgical History:  Procedure Laterality Date  . MOUTH SURGERY    . TUBAL LIGATION      Social History Social History   Tobacco Use  . Smoking status: Never Smoker  . Smokeless tobacco: Never Used  Substance Use Topics  . Alcohol use: Not Currently  . Drug use: Never    Family History Family History  Problem Relation Age of Onset  . Arthritis Mother   . Hypertension Mother   . Hyperlipidemia Mother   . Diabetes Father   . Breast cancer Sister [id number]    No Known Allergies   REVIEW OF SYSTEMS (Negative unless checked)  Constitutional: [] Weight loss  [] Fever  [] Chills Cardiac: [] Chest pain   [] Chest pressure   [] Palpitations   [] Shortness of breath when laying flat   [] Shortness of breath with exertion. Vascular:  [] Pain in legs with walking   [x] Pain in legs with dependency [] History of DVT   [] Phlebitis   [x] Swelling in legs   [x] Varicose veins   [] Non-healing  ulcers Pulmonary:   [] Uses home oxygen   [] Productive cough   [] Hemoptysis   [] Wheeze  [] COPD   [] Asthma Neurologic:  [] Dizziness   [] Seizures   [] History of stroke   [] History of TIA  [] Aphasia   [] Vissual changes   [] Weakness or numbness in arm   [] Weakness or numbness in leg Musculoskeletal:   [] Joint swelling   [x] Joint pain   [] Low back pain Hematologic:  [] Easy bruising  [] Easy bleeding   [] Hypercoagulable state   [] Anemic Gastrointestinal:  [] Diarrhea   [] Vomiting  [] Gastroesophageal reflux/heartburn   [] Difficulty swallowing. Genitourinary:  [] Chronic kidney disease   [] Difficult urination  [] Frequent urination   [] Blood in urine Skin:  [] Rashes   [] Ulcers  Psychological:  [] History of anxiety   []  History of major depression.  Physical Examination  There were no vitals filed for this visit. There is no height or weight on file to calculate BMI. Gen: WD/WN, NAD Head: [place]/AT, No temporalis wasting.  Ear/Nose/Throat: Hearing grossly intact, nares w/o erythema or drainage Eyes: PER, EOMI, sclera nonicteric.  Neck: Supple, no large masses.   Pulmonary:  Good air movement, no audible wheezing bilaterally, no use of accessory muscles.  Cardiac: RRR, no JVD Vascular:  Vessel Right Left  Radial Palpable Palpable  PT Palpable Palpable  DP Palpable Palpable  Gastrointestinal: Non-distended. No guarding/no peritoneal signs.  Musculoskeletal: M/S 5/5 throughout.  No deformity or atrophy.  Neurologic: CN 2-12 intact. Symmetrical.  Speech is fluent. Motor exam as listed above. Psychiatric: Judgment intact, Mood & affect appropriate for pt's clinical situation. Dermatologic: No rashes or ulcers noted.  No changes consistent with cellulitis. Lymph : No lichenification or skin changes of chronic lymphedema.  CBC No results found for: WBC, HGB, HCT, MCV, PLT  BMET No results found for: NA, K, CL, CO2, GLUCOSE, BUN, CREATININE, CALCIUM, GFRNONAA, GFRAA CrCl cannot be calculated (No  successful lab value found.).  COAG No results found for: INR, PROTIME  Radiology No results found.   Assessment/Plan 1. Pain in both lower extremities Recommend:  I do not find evidence of Vascular pathology that would explain the patient's symptoms. Her leg pain is more likely orthopedic in etiology.    The patient has atypical pain symptoms for vascular disease  Noninvasive studies including venous ultrasound of the legs do not identify vascular problems  The patient should continue walking and begin a more formal exercise program. The patient should continue his antiplatelet therapy and aggressive treatment of the lipid abnormalities. The patient should begin wearing graduated compression socks 15-20 mmHg strength to control her mild edema.  Patient will follow-up with me on a PRN basis  Further work-up of her lower extremity pain is deferred to the primary service     2. Bilateral lower extremity edema No surgery or intervention at this point in time.  I have reviewed my discussion with the patient regarding venous insufficiency and why it causes symptoms. I have discussed with the patient the chronic skin changes that accompany venous insufficiency and the long term sequela such as ulceration. Patient will contnue wearing graduated compression stockings on a daily basis, as this has provided excellent control of his edema. The patient will put the stockings on first thing in the morning and removing them in the evening. The patient is reminded not to sleep in the stockings.  In addition, behavioral modification including elevation during the day will be initiated. Exercise is strongly encouraged.  Duplex ultrasound of the bilateral lower extremity shows normal deep system no superficial reflux.  Given the patient's good control and lack of any problems regarding the venous insufficiency and lymphedema a lymph pump in not need at this time.  The patient will follow up with  me PRN should anything change.  The patient voices agreement with this plan.   3. Lymphedema No surgery or intervention at this point in time.  I have reviewed my discussion with the patient regarding venous insufficiency and why it causes symptoms. I have discussed with the patient the chronic skin changes that accompany venous insufficiency and the long term sequela such as ulceration. Patient will contnue wearing graduated compression stockings on a daily basis, as this has provided excellent control of his edema. The patient will put the stockings on first thing in the morning and removing them in the evening. The patient is reminded not to sleep in the stockings.  In addition, behavioral modification including elevation during the day will be initiated. Exercise is strongly encouraged.  Given the patient's good control and lack of any problems regarding the venous insufficiency and lymphedema a lymph pump in not need at this time.  The patient will follow up with me PRN should anything change.  The patient voices agreement with this plan.     Levora Dredge, MD  12/19/2017 9:15 AM

## 2018-02-20 ENCOUNTER — Other Ambulatory Visit: Payer: Self-pay | Admitting: Student

## 2018-02-20 DIAGNOSIS — M5442 Lumbago with sciatica, left side: Principal | ICD-10-CM

## 2018-02-20 DIAGNOSIS — M5441 Lumbago with sciatica, right side: Secondary | ICD-10-CM

## 2018-03-04 ENCOUNTER — Ambulatory Visit
Admission: RE | Admit: 2018-03-04 | Discharge: 2018-03-04 | Disposition: A | Discharge status: Home / Self Care | Payer: Medicaid Other | Source: Ambulatory Visit | Attending: Student | Admitting: Student

## 2018-03-04 DIAGNOSIS — M5442 Lumbago with sciatica, left side: Secondary | ICD-10-CM | POA: Diagnosis not present

## 2018-03-04 DIAGNOSIS — M5441 Lumbago with sciatica, right side: Secondary | ICD-10-CM | POA: Diagnosis present

## 2018-03-04 IMAGING — MR MR LUMBAR SPINE W/O CM
5 series · 31 of 48 positions shown · non-contrast
Comparison: None.

CLINICAL DATA: Low back pain with right leg and foot numbness and
tingling over the last year.

EXAM:
MRI LUMBAR SPINE WITHOUT CONTRAST
TECHNIQUE: Multiplanar, multisequence MR imaging of the lumbar spine was
performed. No intravenous contrast was administered.

[Series 5: T2 · sagittal · 4.0mm · 0.81mm/px · 6 of 17 slices shown (1 of 2)]
[im 1/17]
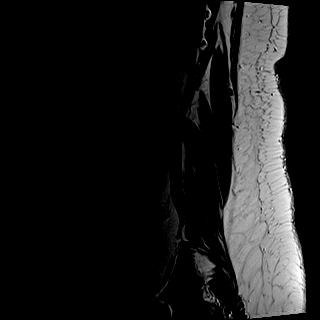
[im 4/17]
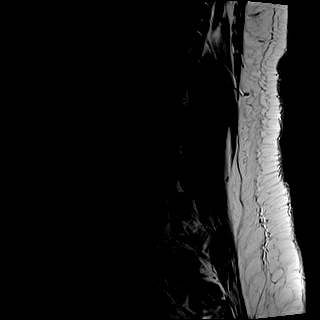
[im 7/17]
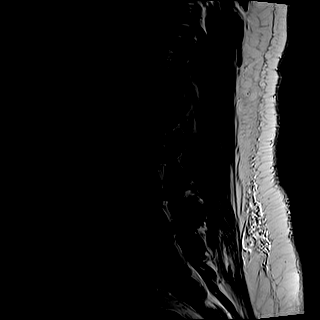
[im 10/17]
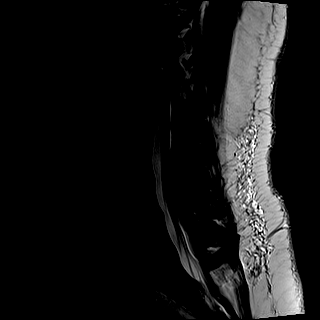
[im 13/17]
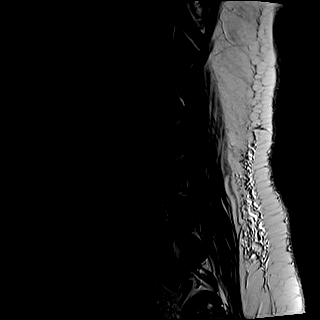
[im 17/17]
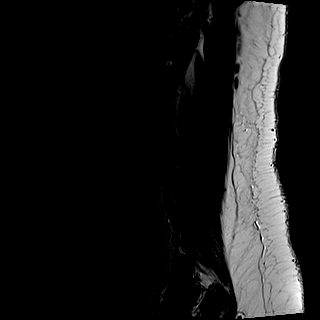

[Series 6: T1 · sagittal · 4.0mm · 0.81mm/px · 7 of 17 slices shown (1 of 2)]
[im 1/17]
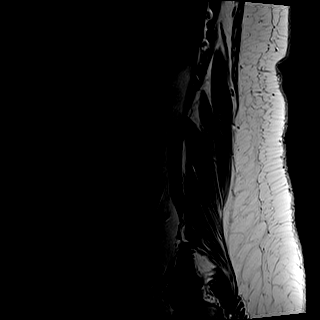
[im 3/17]
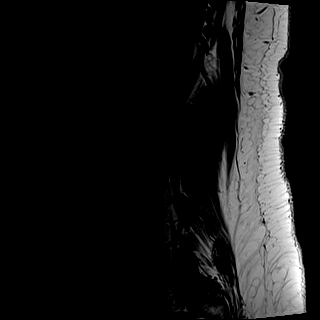
[im 6/17]
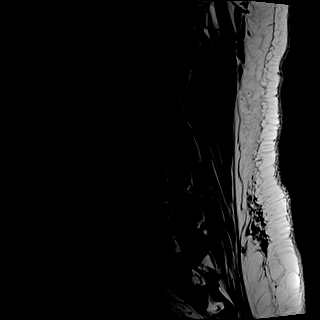
[im 9/17]
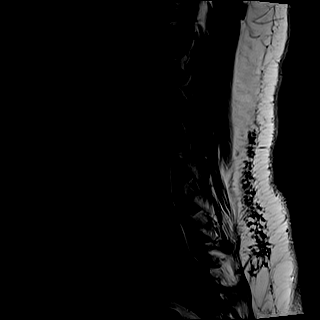
[im 11/17]
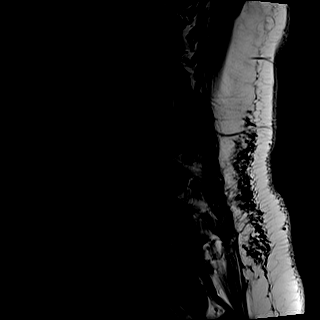
[im 14/17]
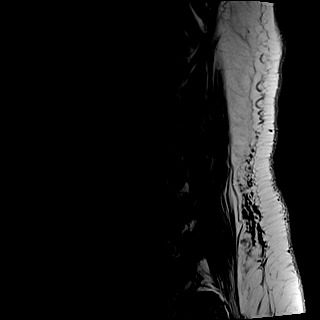
[im 17/17]
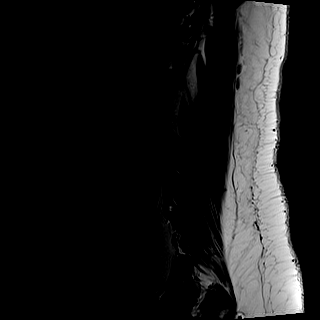

[Series 7: STIR · sagittal · 4.0mm · 0.41mm/px · 2 of 17 slices shown]
[im 1/17]
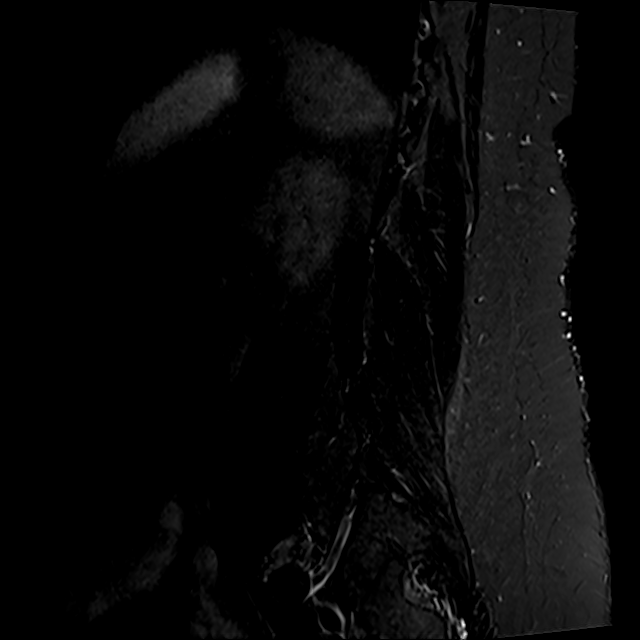
[im 3/17]
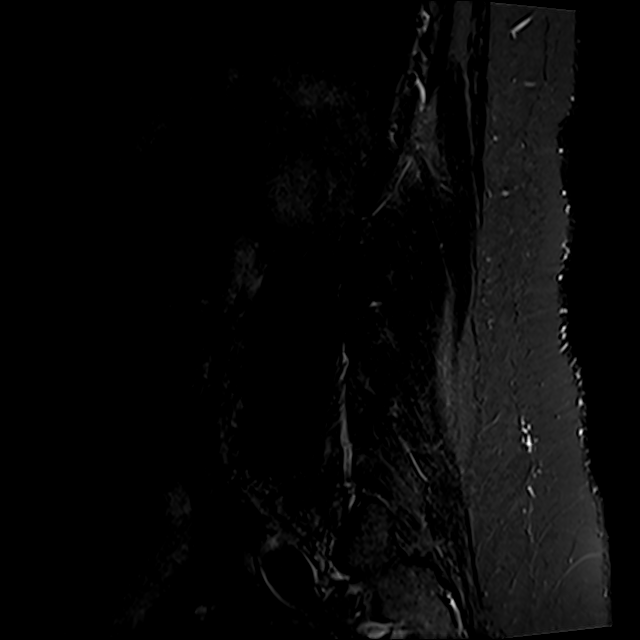

[Series 8: T2 · axial · 4.0mm · 0.78mm/px · z∈[-22,+169]mm · 8 of 33 slices shown (2 of 2)]
[im 1/33]
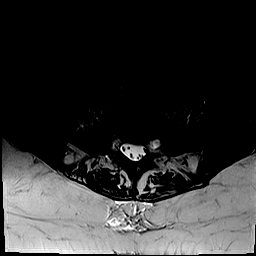
[im 5/33]
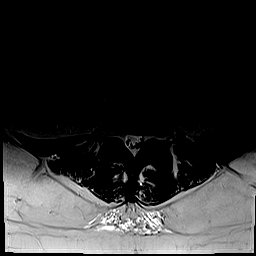
[im 10/33]
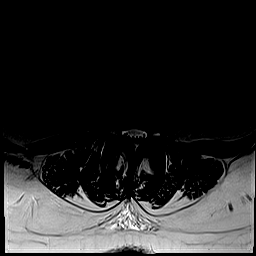
[im 15/33]
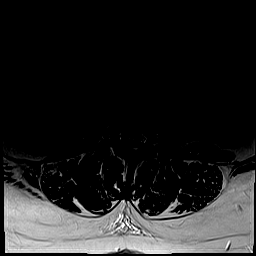
[im 18/33]
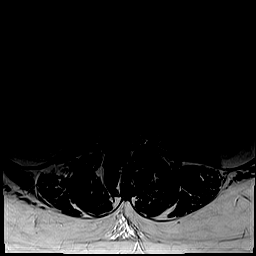
[im 23/33]
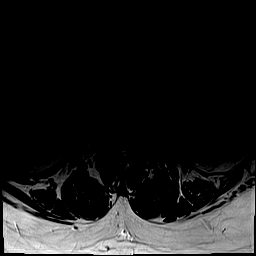
[im 28/33]
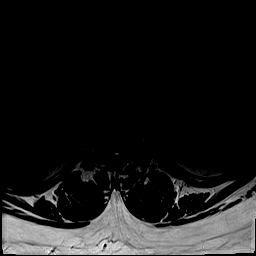
[im 33/33]
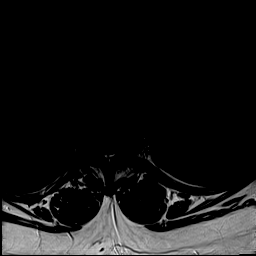

[Series 9: T1 · axial · 4.0mm · 0.39mm/px · z∈[-22,+169]mm · 8 of 33 slices shown (2 of 2)]
[im 1/33]
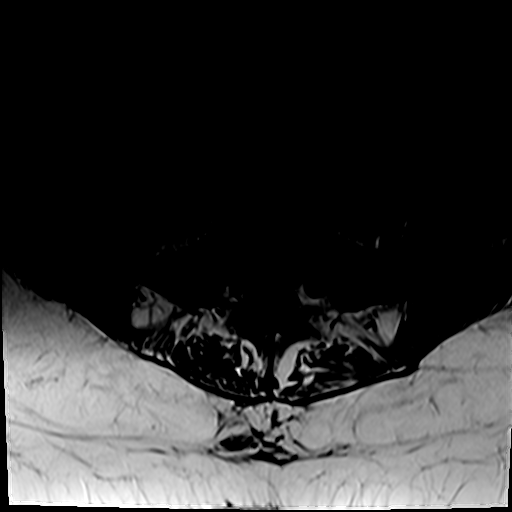
[im 5/33]
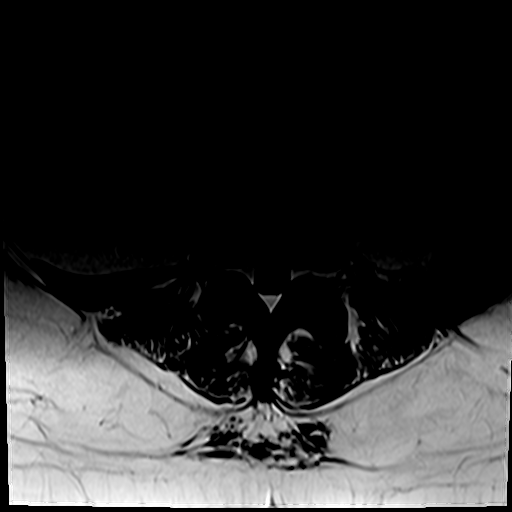
[im 10/33]
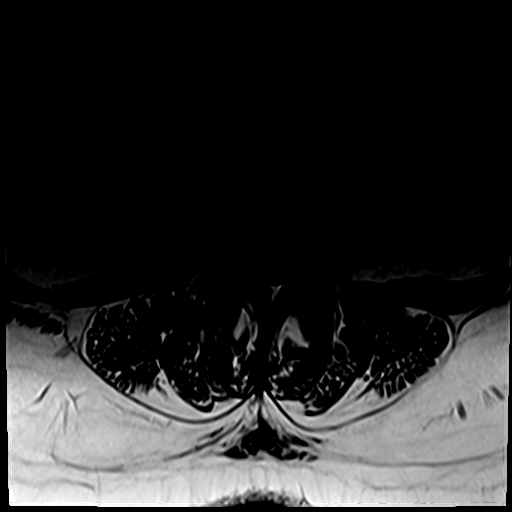
[im 15/33]
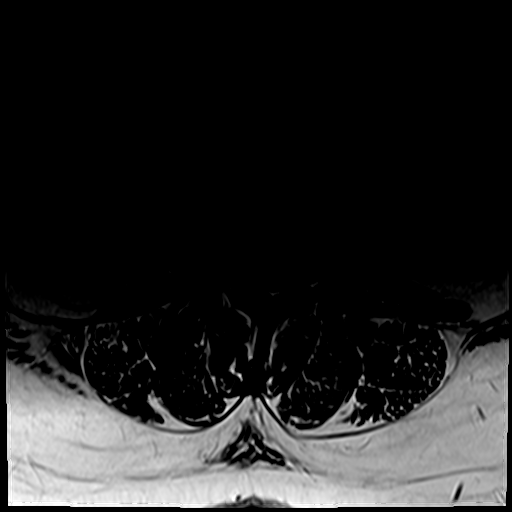
[im 18/33]
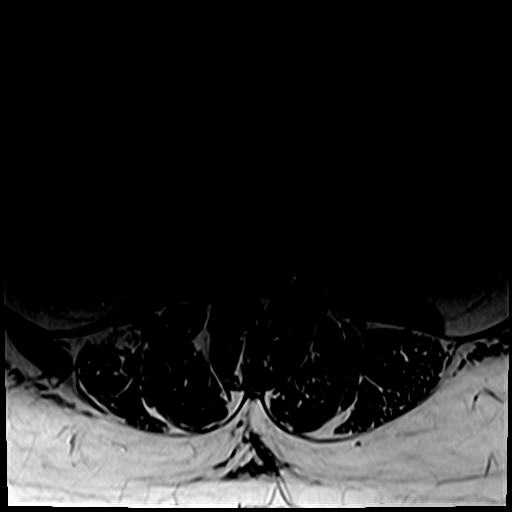
[im 23/33]
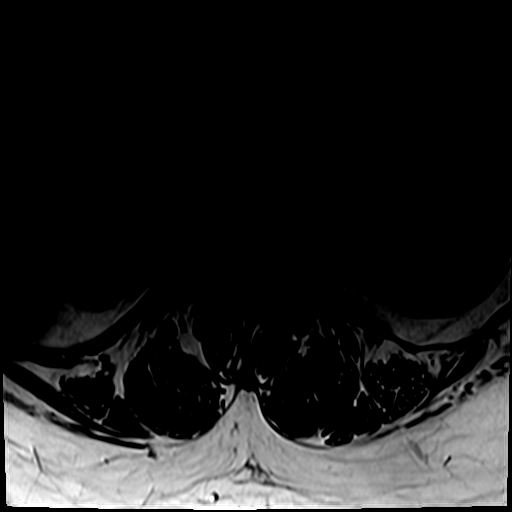
[im 28/33]
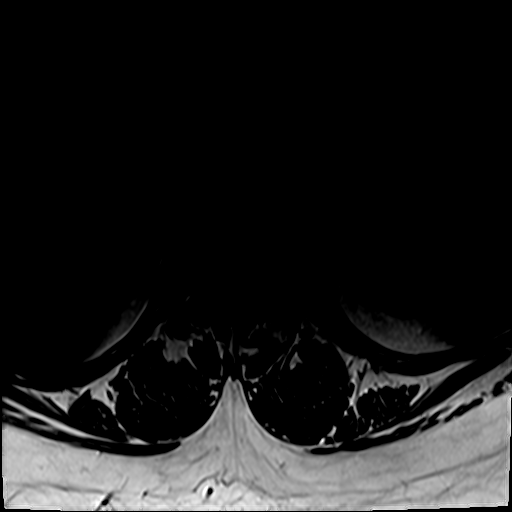
[im 33/33]
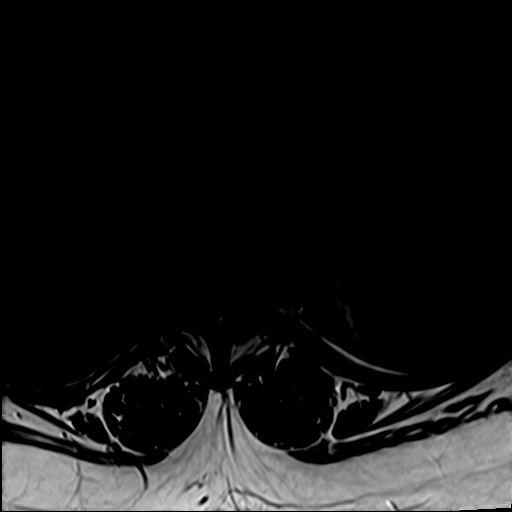

[31 of 48 positions shown; findings below may reference images not displayed]

FINDINGS: Segmentation:  5 lumbar type vertebral bodies assumed.

Alignment: Curvature convex to the right. 3-4 mm of anterolisthesis
at L4-5.

Vertebrae:  No fracture or primary bone lesion.

Conus medullaris and cauda equina: Conus extends to the L2-3. level.
Conus and cauda equina appear normal.

Paraspinal and other soft tissues: Negative

Disc levels:

No significant finding at L3-4 or above. No stenosis of the canal or
foramina.

L4-5: Bilateral facet arthropathy with 3-4 mm of anterolisthesis and
mild bulging of the disc. No apparent compressive stenosis. The
facet arthropathy could be a cause of back pain or referred facet
syndrome pain.

L5-S1 shows mild desiccation and bulging of the disc and mild facet
osteoarthritis. No canal or foraminal stenosis.
IMPRESSION: L4-5: Bilateral facet arthropathy with 3-4 mm of anterolisthesis.
Bulging of the disc. No compressive stenosis. Findings could be
associated with back pain or referred facet syndrome pain.

L5-S1: Mild facet degeneration and disc bulge. No canal or foraminal
stenosis.

## 2018-03-12 ENCOUNTER — Ambulatory Visit: Payer: Medicaid Other | Attending: Student

## 2018-03-12 ENCOUNTER — Encounter: Payer: Self-pay | Admitting: Physical Therapy

## 2018-03-12 ENCOUNTER — Other Ambulatory Visit: Payer: Self-pay

## 2018-03-12 DIAGNOSIS — M5441 Lumbago with sciatica, right side: Secondary | ICD-10-CM | POA: Diagnosis present

## 2018-03-12 DIAGNOSIS — M6281 Muscle weakness (generalized): Secondary | ICD-10-CM

## 2018-03-12 DIAGNOSIS — R269 Unspecified abnormalities of gait and mobility: Secondary | ICD-10-CM | POA: Insufficient documentation

## 2018-03-12 DIAGNOSIS — G8929 Other chronic pain: Secondary | ICD-10-CM | POA: Diagnosis present

## 2018-03-12 NOTE — Therapy (Addendum)
[facility] MAIN [facility] SERVICES [address] [place], [place], [address] Phone: [phone]   Fax:  [phone]  Physical Therapy Evaluation  Patient Details  Name: Lindsey Chavez MRN: [id number] Date of Birth: 1970/06/27 Referring Provider (PT): Ivar Drape   Encounter Date: 03/12/2018  PT End of Session - 03/12/18 1454    Visit Number  1    Number of Visits  16    Date for PT Re-Evaluation  05/07/18    Authorization Type  medicaid    PT Start Time  1505    PT Stop Time  1605    PT Time Calculation (min)  60 min    Equipment Utilized During Treatment  Gait belt    Activity Tolerance  Patient tolerated treatment well    Behavior During Therapy  [facility] for tasks assessed/performed       Past Medical History:  Diagnosis Date  . Asthma   . Depressive disorder     Past Surgical History:  Procedure Laterality Date  . MOUTH SURGERY    . TUBAL LIGATION      There were no vitals filed for this visit.   Palpation: TTP of bilateral lumbar paraspinals, glute max/med bilaterally, bilateral PSIS R>L, spinous processes of T12-L5, sacrum  POSTURE: Patient may present with L lateral shift (shoulders shifted towards L to offset R sided pain). Ambulated with slightly flexed posture, slow cadence, decreased step length,   Muscle/tissue length: Moderately restricted hamstring length bilaterally R >L Moderately restricted piriformis length R > L  PROM/AROM: Hip PROM WFLs  Lumbar flexion: 50% Lumbar extension: 25% Lumbar L lateral flexion: 50% Lumbar R lateral flexion: 25% Lumbar R rotation: 25% Lumbar L rotation: 50% Patient indicated that all lumbar motions as painful  Repeated motions performed, pt able to confirm lumbar flexion definitely increased lumbar and R leg pain.   STRENGTH:  Graded on a 0-5 scale Muscle Group Left Right  Hip Flex 3+* 3+*  Hip Abd 4-* 4-*  Hip Add 4-* 4-*      Knee Flex 3+* 3*  Knee Ext 3+* 3*   Ankle DF 4 4  Ankle PF (seated) 4 4  *indicates painful  SENSATION: Pt reported burning sensation with sensation assessment of S1 on RLE.  SPECIAL TESTS: SLR on R: + Slump on R: + FABER on R: +  FUNCTIONAL MOBILITY: Significant difficulty with sit to stands, supine to sit, and sit to supine transfers. Instructed in log rolling technique to minimize pain.   OUTCOME MEASURES: TEST Outcome Interpretation  LEFS 31/80 Indicates decreased ability to perform tasks and approximately 21-40% impairment    This patient presents with 3 personal factors/ comorbidities, and 4 body elements including body structures and functions, activity limitations and or participation restrictions. Patient's condition is evolving.  Subjective Assessment - 03/12/18 1458    Subjective  Patient's main complaint is her low back pain, and right leg weakness.    Pertinent History  Patient reported that in the last year her back pain and leg weakness has gotten significantly worse. 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 Has three children with autism and is primary caregiver. Children are 11, 13,16. Patient is not currently working. Was attending school but recently had to stop to address her limitations. Patient stated that when her back pain started, she also have numbness/tingling with RLE.  Primarily in R calf, has sensation changes in foot as well. Also complaints  of bilateraly ankle swelling.     Limitations  Sitting;Walking;Lifting;Standing;House hold activities    How long can you sit comfortably?  10 minutes    How long can you stand comfortably?  10-12 minutes    How long can you walk comfortably?  15 minutes    Diagnostic tests  xray see results    Patient Stated Goals  Decrease pain, strengthen legs    Currently in Pain?  Yes    Pain Score  7    best: 3 worst: 9/10   Pain Location  Calf    Pain Orientation  Right    Pain Descriptors / Indicators  Shooting;Sharp     Pain Type  Chronic pain;Neuropathic pain    Pain Radiating Towards  R foot    Pain Onset  More than a month ago    Pain Frequency  Constant    Aggravating Factors   standing, walking, lifting, bending    Pain Relieving Factors  numbing gel, heating pad    Effect of Pain on Daily Activities  impedes daily activities      Objective measurements completed on examination: See above findings.    TREATMENT: Pt performed with verbal and tactile cues, administered handout, agreeable at this time to HEP compliance.  laying prone x5 minutes: decreased foot numbness, no change to thigh numbness tingling, no change in LE pain LTR 5x5seconds in supine  Piriformis stretch bilaterally 3x30secs with cues to promote    Patient response to treatment: Patient able to report decreased R foot numbness after lying prone. Instructed to avoid bending over the next couple of days.   The patient is a pleasant 48 yo female that presents to clinic with LE weakness and low back pain with radicular symptoms of RLE. Patient tested positive for radiculopathy special tests such as slump and SLR on R. Patient also demonstrated deficits in LE strength, posture, gait, activity tolerance, endurance, and pain with mobility. These deficits limit the patient's ability to perform functional activities, child care, and decreases the patients quality of life. The patient would benefit from further skilled PT intervention to address these to maximize QOL, functional abilities, and pain management.        PT Education - 03/12/18 1457    Education Details  HEP, POC, PT role, exercise techniques    Person(s) Educated  Patient    Methods  Explanation;Demonstration;Handout    Comprehension  Verbalized understanding       PT Short Term Goals - 03/12/18 1728      PT SHORT TERM GOAL #1   Title  Patient will be adherent to initial HEP and perform independently at least once a day in prep for self management of condition.     Baseline  HEP given 03/12/2018    Time  4    Period  Weeks    Status  New    Target Date  04/09/18      PT SHORT TERM GOAL #2   Title  The patient will report greatest pain of low back/RLE as 7/10 to indicate improved pain management and ability to perform ADLs with decreased pain/    Baseline  03/13/2018 greatest pain 9/10.     Time  4    Period  Weeks    Status  New    Target Date  04/09/18        PT Long Term Goals - 03/12/18 1728      PT LONG TERM GOAL #1   Title  The patient will demonstrated at least 4/5 of LE strength to improve ability to perform functional activities.    Baseline  see objective section of eval note to see MMT grades.    Time  8    Period  Weeks    Status  New    Target Date  05/07/18      PT LONG TERM GOAL #2   Title  The patient will report greatest low back/R leg pain of 5/10 to demonstrated improved pain management and increased ability to perform ADLs with decreased pain.    Baseline  03/12/2018 greatest pain 9/10.    Time  8    Period  Weeks    Status  New    Target Date  05/07/18      PT LONG TERM GOAL #3   Title  The patient will exhibit improved ability to perfom functional activities by at least 10 point improvement in LEFS outcome measure.    Baseline  03/12/2018 31/80    Time  8    Period  Weeks    Target Date  05/07/18      PT LONG TERM GOAL #4   Title  The patient will report an ability to centralize symptoms at least 50% with appropriate HEP exercises to improve ability to self manage condition at home and pain management.    Baseline  HEP administered 03/12/2018    Time  8    Period  Weeks    Status  New    Target Date  05/07/18             Plan - 03/12/18 1725    Clinical Impression Statement  The patient is a pleasant 48 yo female that presents to clinic with LE weakness and low back pain with radicular symptoms of RLE. Patient tested positive for radiculopathy special tests such as slump and SLR on R. Patient also  demonstrated deficits in LE strength, posture, gait, activity tolerance, endurance, and pain with mobility. These deficits limit the patient's ability to perform functional activities, child care, and decreases the patients quality of life. The patient would benefit from further skilled PT intervention to address these to maximize QOL, functional abilities, and pain management.     Personal Factors and Comorbidities  Age;Fitness;Finances;Comorbidity 2;Time since onset of injury/illness/exacerbation    Comorbidities  asthma, depressive disorder    Examination-Activity Limitations  Bathing;Bed Mobility;Bend;Caring for Others;Dressing;Carry;Lift;Sit;Stand;Sleep;Squat;Transfers;Stairs;Locomotion Level    Examination-Participation Restrictions  Driving;Yard Work;Cleaning;Meal Prep;Community Activity;Laundry    Stability/Clinical Decision Making  Evolving/Moderate complexity    Clinical Decision Making  Moderate    Rehab Potential  Fair    PT Frequency  2x / week    PT Duration  8 weeks    PT Treatment/Interventions  ADLs/Self Care Home Management;Aquatic Therapy;Electrical Stimulation;Moist Heat;Traction;Ultrasound;Gait training;Functional mobility Network engineer;Therapeutic activities;Neuromuscular re-education;Balance training;Therapeutic exercise;Patient/family education;Energy conservation;Passive range of motion;Spinal Manipulations;Joint Manipulations;Taping;Vestibular;Dry needling;Manual techniques    PT Next Visit Plan  stretches, strengthening, extension biased exercises    PT Home Exercise Plan  see pt instruction section    Consulted and Agree with Plan of Care  Patient       Patient will benefit from skilled therapeutic intervention in order to improve the following deficits and impairments:  Abnormal gait, Decreased activity tolerance, Decreased endurance, Decreased range of motion, Decreased strength, Hypomobility, Increased fascial restricitons, Impaired sensation, Improper body  mechanics, Pain, Decreased balance, Difficulty walking, Increased edema, Increased muscle spasms, Impaired flexibility, Postural dysfunction, Obesity  Visit Diagnosis: Muscle weakness (  generalized) - Plan: PT plan of care cert/re-cert  Abnormality of gait and mobility - Plan: PT plan of care cert/re-cert  Chronic bilateral low back pain with right-sided sciatica - Plan: PT plan of care cert/re-cert     Problem List Patient Active Problem List   Diagnosis Date Noted  . Lymphedema 12/19/2017  . Pain in both lower extremities 08/19/2017  . Bilateral lower extremity edema 08/19/2017    Olga Coaster PT, DPT 2:33 PM,03/13/18 [phone]  [facility] MAIN [facility] SERVICES [address] [place], [place], [address] Phone: [phone]   Fax:  [phone]  Name: Lindsey Chavez MRN: [id number] Date of Birth: May 10, 1970

## 2018-03-12 NOTE — Patient Instructions (Signed)
Access Code: [email or web address]  URL: [email or web address]  Date: 03/12/2018  Prepared by: Olga Coaster   Exercises  Supine Piriformis Stretch with Foot on Ground - 3 reps - 1 sets - 30 hold - 1x daily - 7x weekly  Supine Lower Trunk Rotation - 3 reps - 1 sets - 30 hold - 1x daily - 7x weekly  Lying Prone - 1x daily - 7x weekly

## 2018-03-27 ENCOUNTER — Other Ambulatory Visit: Payer: Self-pay

## 2018-03-27 ENCOUNTER — Encounter: Payer: Self-pay | Admitting: Physical Therapy

## 2018-03-27 ENCOUNTER — Ambulatory Visit: Payer: Medicaid Other | Admitting: Physical Therapy

## 2018-03-27 DIAGNOSIS — M6281 Muscle weakness (generalized): Secondary | ICD-10-CM | POA: Diagnosis not present

## 2018-03-27 DIAGNOSIS — G8929 Other chronic pain: Secondary | ICD-10-CM

## 2018-03-27 DIAGNOSIS — M5441 Lumbago with sciatica, right side: Secondary | ICD-10-CM

## 2018-03-27 DIAGNOSIS — R269 Unspecified abnormalities of gait and mobility: Secondary | ICD-10-CM

## 2018-03-27 NOTE — Therapy (Signed)
[facility] MAIN [facility] SERVICES [address] [place], [place], [address] Phone: [phone]   Fax:  [phone]  Physical Therapy Treatment  Patient Details  Name: Lindsey Chavez MRN: [id number] Date of Birth: [birth year] Referring Provider (PT): Ivar Drape   Encounter Date: 03/27/2018  PT End of Session - 03/27/18 1342    Visit Number  2    Number of Visits  16    Date for PT Re-Evaluation  05/07/18    Authorization Type  medicaid    Authorization - Visit Number  1    Authorization - Number of Visits  3    PT Start Time  1345    PT Stop Time  1430    PT Time Calculation (min)  45 min    Equipment Utilized During Treatment  Gait belt    Activity Tolerance  Patient tolerated treatment well    Behavior During Therapy  [facility] for tasks assessed/performed       Past Medical History:  Diagnosis Date  . Asthma   . Depressive disorder     Past Surgical History:  Procedure Laterality Date  . MOUTH SURGERY    . TUBAL LIGATION      There were no vitals filed for this visit.  Subjective Assessment - 03/27/18 1347    Subjective  Patient reported that her pain is not so bad today, provided 7/10. Stated she is doing her HEP. Minimal to no change in numbness/tingling reported.    Pertinent History  Patient reported that in the last year her back pain and leg weakness has gotten significantly worse. 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 Has three children with autism and is primary caregiver. Children are 11, 13,16. Patient is not currently working. Was attending school but recently had to stop to address her limitations. Patient stated that when her back pain started, she also have numbness/tingling with RLE.  Primarily in R calf, has sensation changes in foot as well. Also complaints of bilateraly ankle swelling.     Limitations  Sitting;Walking;Lifting;Standing;House hold activities    How long can you sit  comfortably?  10 minutes    How long can you stand comfortably?  10-12 minutes    How long can you walk comfortably?  15 minutes    Diagnostic tests  xray see results    Patient Stated Goals  Decrease pain, strengthen legs    Currently in Pain?  Yes    Pain Score  7     Pain Location  Calf    Pain Orientation  Right    Pain Descriptors / Indicators  Throbbing    Pain Type  Chronic pain    Pain Onset  More than a month ago       TREATMENT: Pt performed with verbal and tactile cues, educated about condition and POC.    Therapeutic exercises laying prone x44minutes:  LTR 10x10seconds in supine  Piriformis stretch bilaterally 3x30secs with cues to promote  TrA activation in supine 15x3sec holds, extensive verbal and visual cues Tra activation in supine with hip adduction x15 with 3sec hold Standing lumbar extension with table block 10-15x   manual therapy: STM to lumbar paraspinals glute med/max, with roller stick, PA mobs L5-L1 grade I 30sec bouts ea level   Patient response to treatment unclear due to pt having difficulty explaining symptoms. Pt reported increased foot numbness in supine, decreased foot numbness in prone position.  Extensive verbal cues needed for proper core activation during exercises. The patient would benefit from further skilled PT to continue to address pain, improve functional activities.    PT Education - 03/27/18 1342    Education Details  exercise technique/HEP POC    Person(s) Educated  Patient    Methods  Demonstration;Explanation;Handout    Comprehension  Verbalized understanding;Returned demonstration       PT Short Term Goals - 03/12/18 1728      PT SHORT TERM GOAL #1   Title  Patient will be adherent to initial HEP and perform independently at least once a day in prep for self management of condition.    Baseline  HEP given 03/12/2018    Time  4    Period  Weeks    Status  New    Target Date  04/09/18      PT SHORT TERM GOAL #2   Title   The patient will report greatest pain of low back/RLE as 7/10 to indicate improved pain management and ability to perform ADLs with decreased pain/    Baseline  03/13/2018 greatest pain 9/10.     Time  4    Period  Weeks    Status  New    Target Date  04/09/18        PT Long Term Goals - 03/12/18 1728      PT LONG TERM GOAL #1   Title  The patient will demonstrated at least 4/5 of LE strength to improve ability to perform functional activities.    Baseline  see objective section of eval note to see MMT grades.    Time  8    Period  Weeks    Status  New    Target Date  05/07/18      PT LONG TERM GOAL #2   Title  The patient will report greatest low back/R leg pain of 5/10 to demonstrated improved pain management and increased ability to perform ADLs with decreased pain.    Baseline  03/12/2018 greatest pain 9/10.    Time  8    Period  Weeks    Status  New    Target Date  05/07/18      PT LONG TERM GOAL #3   Title  The patient will exhibit improved ability to perfom functional activities by at least 10 point improvement in LEFS outcome measure.    Baseline  03/12/2018 31/80    Time  8    Period  Weeks    Target Date  05/07/18      PT LONG TERM GOAL #4   Title  The patient will report an ability to centralize symptoms at least 50% with appropriate HEP exercises to improve ability to self manage condition at home and pain management.    Baseline  HEP administered 03/12/2018    Time  8    Period  Weeks    Status  New    Target Date  05/07/18            Plan - 03/27/18 1547    Clinical Impression Statement  Patient response to treatment unclear due to pt having difficulty explaining symptoms. Pt reported increased foot numbness in supine, decreased foot numbness in prone position. Extensive verbal cues needed for proper core activation during exercises. The patient would benefit from further skilled PT to continue to address pain, improve functional activities.     Rehab  Potential  Fair    PT Frequency  2x / week    PT Duration  8 weeks    PT Treatment/Interventions  ADLs/Self Care Home Management;Aquatic Therapy;Electrical Stimulation;Moist Heat;Traction;Ultrasound;Gait training;Functional mobility Network engineer;Therapeutic activities;Neuromuscular re-education;Balance training;Therapeutic exercise;Patient/family education;Energy conservation;Passive range of motion;Spinal Manipulations;Joint Manipulations;Taping;Vestibular;Dry needling;Manual techniques    PT Next Visit Plan  stretches, strengthening, extension biased exercises    PT Home Exercise Plan  see pt instruction section    Consulted and Agree with Plan of Care  Patient       Patient will benefit from skilled therapeutic intervention in order to improve the following deficits and impairments:  Abnormal gait, Decreased activity tolerance, Decreased endurance, Decreased range of motion, Decreased strength, Hypomobility, Increased fascial restricitons, Impaired sensation, Improper body mechanics, Pain, Decreased balance, Difficulty walking, Increased edema, Increased muscle spasms, Impaired flexibility, Postural dysfunction, Obesity  Visit Diagnosis: Muscle weakness (generalized)  Abnormality of gait and mobility  Chronic bilateral low back pain with right-sided sciatica     Problem List Patient Active Problem List   Diagnosis Date Noted  . Lymphedema 12/19/2017  . Pain in both lower extremities 08/19/2017  . Bilateral lower extremity edema 08/19/2017    Olga Coaster PT, DPT 3:49 PM,03/27/18 [phone]  [facility] MAIN [facility] SERVICES [address] [place], [place], [address] Phone: [phone]   Fax:  [phone]  Name: Jacora Hopkins MRN: [id number] Date of Birth: 1970/03/05

## 2018-04-01 NOTE — Therapy (Unsigned)
[facility] MAIN [facility] SERVICES [address] [place], [place], [address] Phone: [phone]   Fax:  [phone]  Patient Details  Name: Lindsey Chavez MRN: [id number] Date of Birth: 04-04-1970 Referring Provider:  No ref. provider found  Encounter Date: 04/01/2018   DPT called patient to let patient know the status of the clinic and ensure that we will be reaching out to schedule when we re-open. DPT fielded any questions patient had at this time and offered guidance on current home program as necessary. Patient reported that she is doing well, and that her HEP seems to be helping. Patient had no further questions at this time, and DPT advised that should questions arise patient can reach out to the clinic for guidance via phone.   Olga Coaster PT, DPT 9:50 AM,04/01/18 [phone]  [facility] MAIN [facility] SERVICES [address] [place], [place], [address] Phone: [phone]   Fax:  [phone]

## 2018-04-02 ENCOUNTER — Ambulatory Visit: Payer: Medicaid Other | Admitting: Physical Therapy

## 2018-04-09 ENCOUNTER — Ambulatory Visit: Payer: Medicaid Other | Admitting: Physical Therapy

## 2018-09-16 ENCOUNTER — Other Ambulatory Visit: Payer: Self-pay | Admitting: Cardiology

## 2018-09-16 ENCOUNTER — Other Ambulatory Visit: Payer: Self-pay | Admitting: Physician Assistant

## 2018-09-16 DIAGNOSIS — R0602 Shortness of breath: Secondary | ICD-10-CM

## 2018-09-16 DIAGNOSIS — R011 Cardiac murmur, unspecified: Secondary | ICD-10-CM

## 2018-09-19 ENCOUNTER — Other Ambulatory Visit: Payer: Self-pay | Admitting: Physician Assistant

## 2018-09-19 ENCOUNTER — Other Ambulatory Visit: Payer: Medicaid Other

## 2018-09-19 DIAGNOSIS — R0602 Shortness of breath: Secondary | ICD-10-CM

## 2018-09-29 ENCOUNTER — Ambulatory Visit
Admission: RE | Admit: 2018-09-29 | Discharge: 2018-09-29 | Disposition: A | Discharge status: Home / Self Care | Payer: Medicaid Other | Source: Ambulatory Visit | Attending: Physician Assistant | Admitting: Physician Assistant

## 2018-09-29 ENCOUNTER — Other Ambulatory Visit: Payer: Self-pay

## 2018-09-29 ENCOUNTER — Encounter
Admission: RE | Admit: 2018-09-29 | Discharge: 2018-09-29 | Disposition: A | Discharge status: Home / Self Care | Payer: Medicaid Other | Source: Ambulatory Visit | Attending: Physician Assistant | Admitting: Physician Assistant

## 2018-09-29 DIAGNOSIS — R0602 Shortness of breath: Secondary | ICD-10-CM | POA: Insufficient documentation

## 2018-09-29 DIAGNOSIS — R011 Cardiac murmur, unspecified: Secondary | ICD-10-CM

## 2018-09-29 LAB — NM MYOCAR MULTI W/SPECT W/WALL MOTION / EF
LV dias vol: 78 mL (ref 46–106)
LV sys vol: 37 mL
Peak HR: 116 {beats}/min
Rest HR: 96 {beats}/min
SDS: 4
SRS: 9
SSS: 2
TID: 0.98

## 2018-09-29 MED ORDER — REGADENOSON 0.4 MG/5ML IV SOLN
0.4000 mg | Freq: Once | INTRAVENOUS | Status: AC
  Administered 2018-09-29: 10:00:00 0.4 mg via INTRAVENOUS

## 2018-09-29 MED ORDER — TECHNETIUM TC 99M TETROFOSMIN IV KIT
30.0000 | PACK | Freq: Once | INTRAVENOUS | Status: AC | PRN
  Administered 2018-09-29: 10:00:00 31.992 via INTRAVENOUS

## 2018-09-29 MED ORDER — TECHNETIUM TC 99M TETROFOSMIN IV KIT
10.0000 | PACK | Freq: Once | INTRAVENOUS | Status: AC | PRN
  Administered 2018-09-29: 09:00:00 10.5 via INTRAVENOUS

## 2018-09-29 NOTE — Progress Notes (Signed)
*  PRELIMINARY RESULTS* Echocardiogram 2D Echocardiogram has been performed.  Lindsey Chavez 09/29/2018, 10:40 AM

## 2019-04-06 ENCOUNTER — Other Ambulatory Visit: Payer: Self-pay

## 2019-04-06 ENCOUNTER — Ambulatory Visit: Payer: Medicaid Other | Attending: Internal Medicine

## 2019-04-06 DIAGNOSIS — Z23 Encounter for immunization: Secondary | ICD-10-CM

## 2019-04-06 NOTE — Progress Notes (Signed)
   Covid-19 Vaccination Clinic  Name:  Lindsey Chavez    MRN: [id number] DOB: 1970-05-21  04/06/2019  [name] was observed post Covid-19 immunization for 15 minutes without incident. She was provided with Vaccine Information Sheet and instruction to access the V-Safe system.   [name] was instructed to call 911 with any severe reactions post vaccine: Marland Kitchen Difficulty breathing  . Swelling of face and throat  . A fast heartbeat  . A bad rash all over body  . Dizziness and weakness   Immunizations Administered    Name Date Dose VIS Date Route   Pfizer COVID-19 Vaccine 04/06/2019 12:58 PM 0.3 mL 12/19/2018 Intramuscular   Manufacturer: [employer], [employer]   Lot: [id number]   NDC: [address]-3

## 2019-04-14 ENCOUNTER — Other Ambulatory Visit: Payer: Self-pay | Admitting: Family Medicine

## 2019-04-14 DIAGNOSIS — Z1231 Encounter for screening mammogram for malignant neoplasm of breast: Secondary | ICD-10-CM

## 2019-05-05 ENCOUNTER — Ambulatory Visit: Payer: Medicaid Other | Attending: Internal Medicine

## 2019-05-05 DIAGNOSIS — Z23 Encounter for immunization: Secondary | ICD-10-CM

## 2019-05-05 NOTE — Progress Notes (Signed)
   Covid-19 Vaccination Clinic  Name:  Lindsey Chavez    MRN: [id number] DOB: 03/27/1970  05/05/2019  Lindsey Chavez was observed post Covid-19 immunization for 15 minutes without incident. She was provided with Vaccine Information Sheet and instruction to access the V-Safe system.   Lindsey Chavez was instructed to call 911 with any severe reactions post vaccine: Marland Kitchen Difficulty breathing  . Swelling of face and throat  . A fast heartbeat  . A bad rash all over body  . Dizziness and weakness   Immunizations Administered    Name Date Dose VIS Date Route   Pfizer COVID-19 Vaccine 05/05/2019  9:52 AM 0.3 mL 03/04/2018 Intramuscular   Manufacturer: [employer], [employer]   Lot: [id number]   NDC: [address]-3

## 2019-06-22 ENCOUNTER — Ambulatory Visit
Admission: RE | Admit: 2019-06-22 | Discharge: 2019-06-22 | Disposition: A | Discharge status: Home / Self Care | Payer: Medicaid Other | Source: Ambulatory Visit | Attending: Family Medicine | Admitting: Family Medicine

## 2019-06-22 DIAGNOSIS — Z1231 Encounter for screening mammogram for malignant neoplasm of breast: Secondary | ICD-10-CM | POA: Insufficient documentation

## 2019-06-22 IMAGING — MG DIGITAL SCREENING BILAT W/ TOMO W/ CAD
6 of 12 series · 6 of 36 positions shown · non-contrast
Comparison: Previous exam(s).

CLINICAL DATA: Screening.

EXAM:
DIGITAL SCREENING BILATERAL MAMMOGRAM WITH TOMO AND CAD

[R MLO synth-2D (1 of 2)]
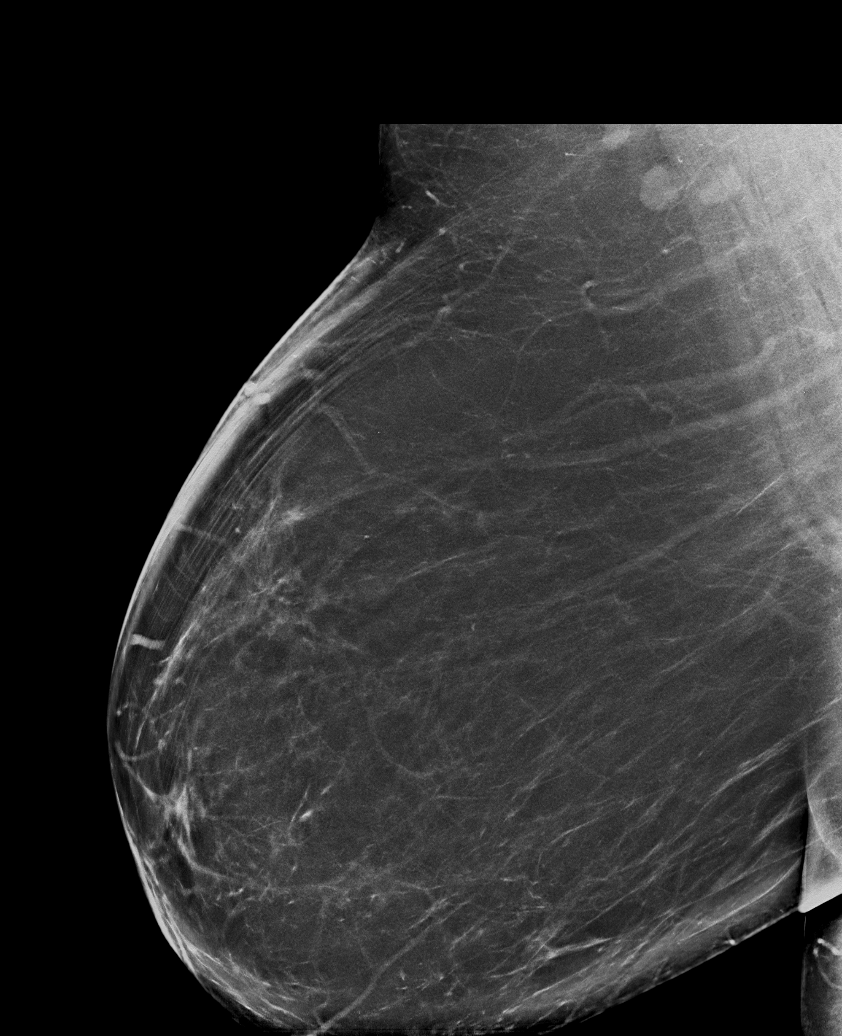

[R CC synth-2D]
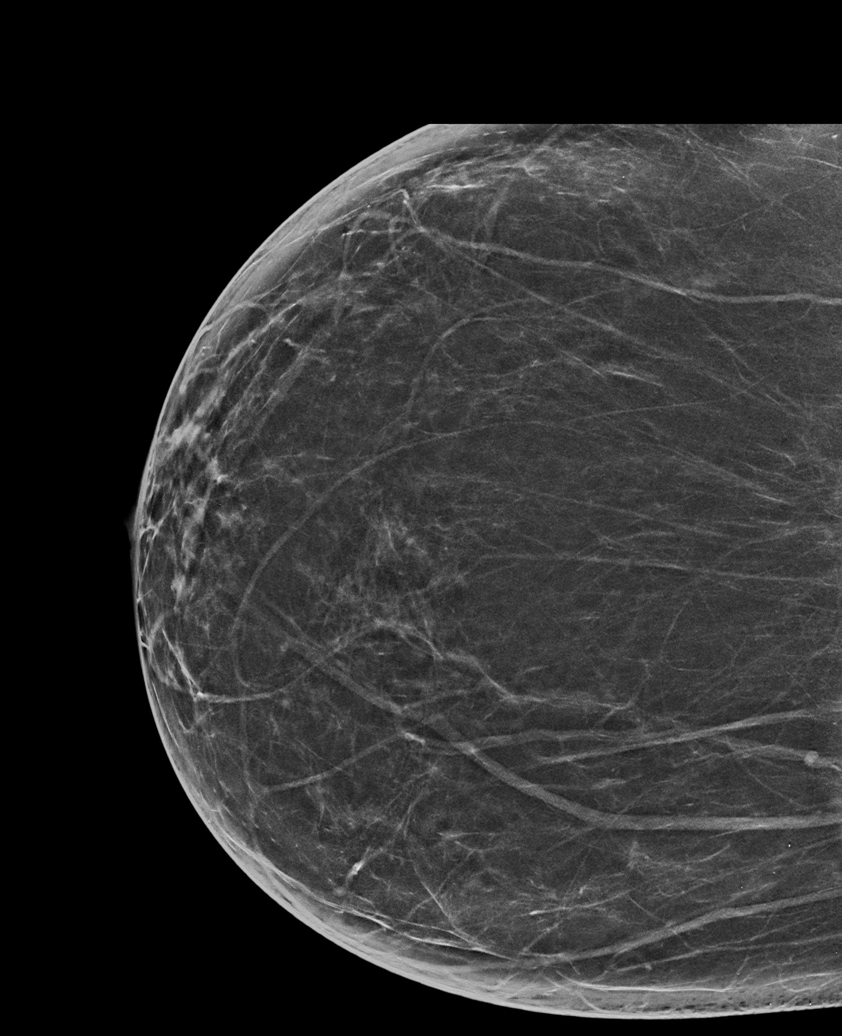

[R MLO synth-2D (2 of 2)]
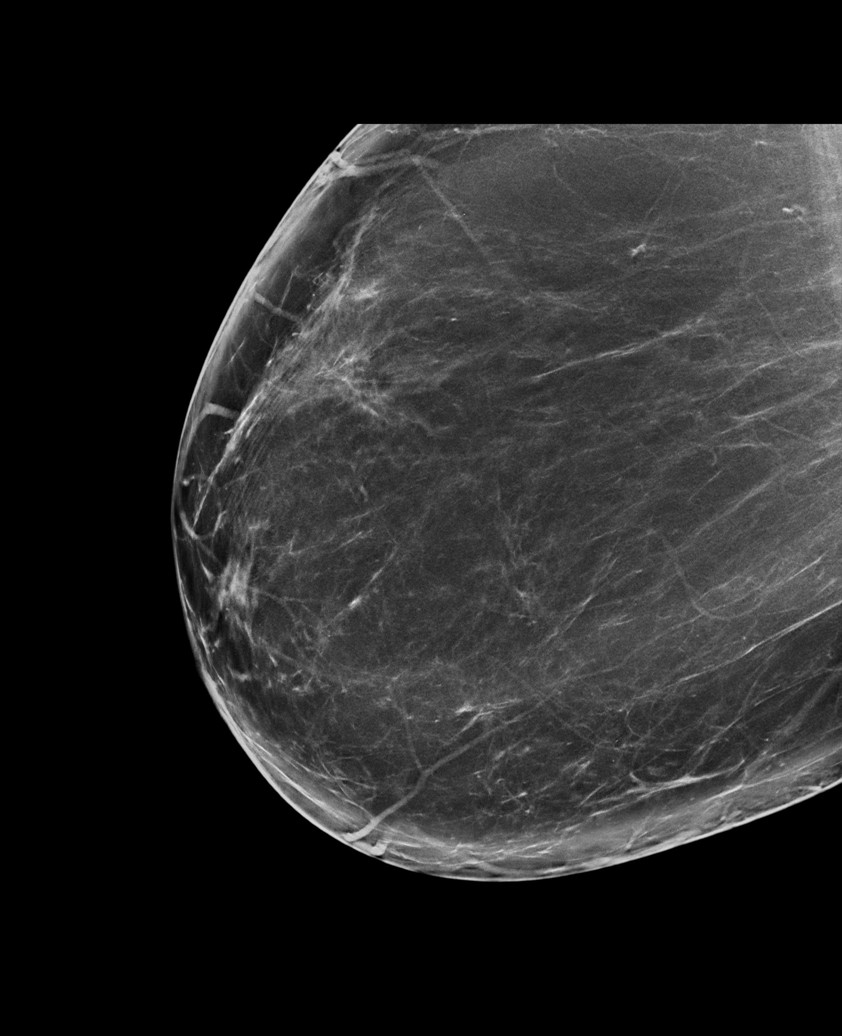

[L MLO synth-2D (1 of 2)]
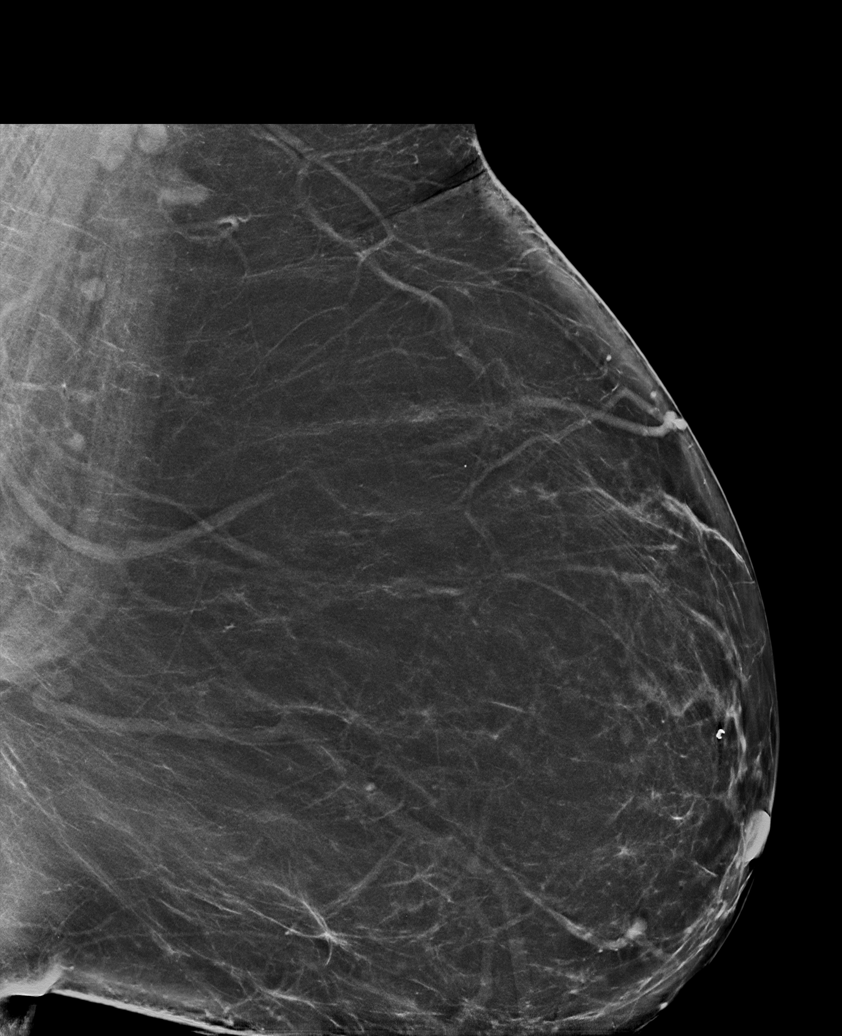

[L CC synth-2D]
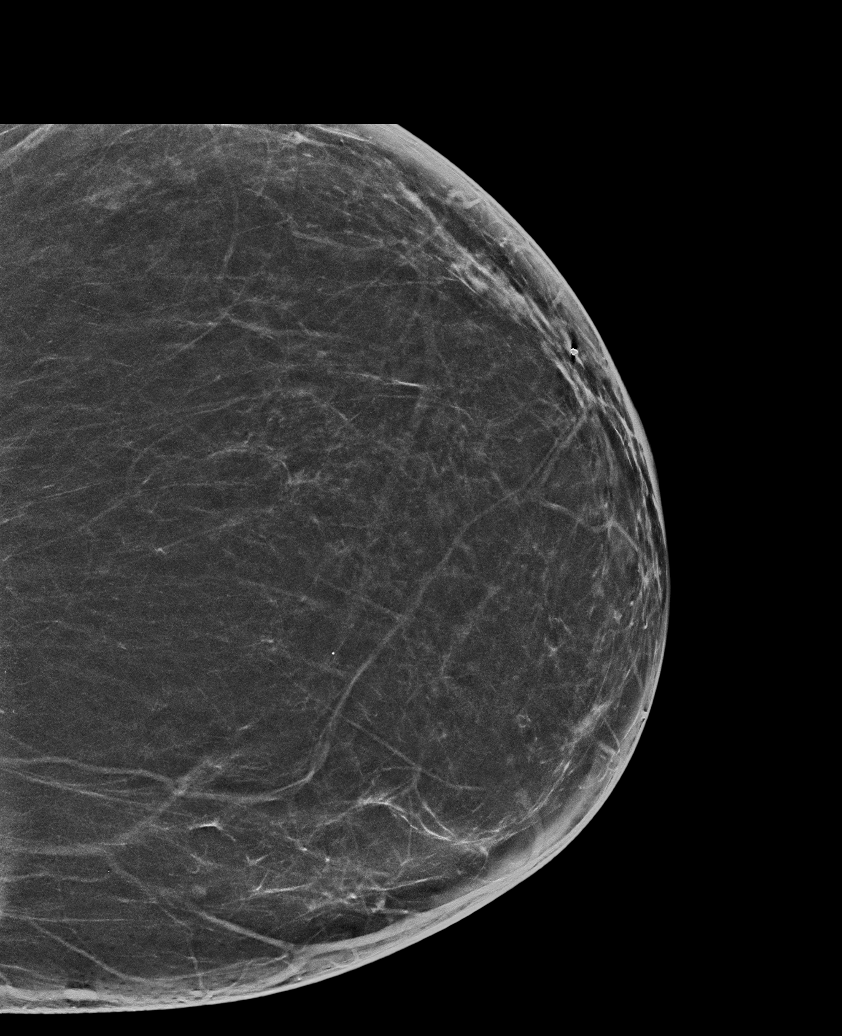

[L MLO synth-2D (2 of 2)]
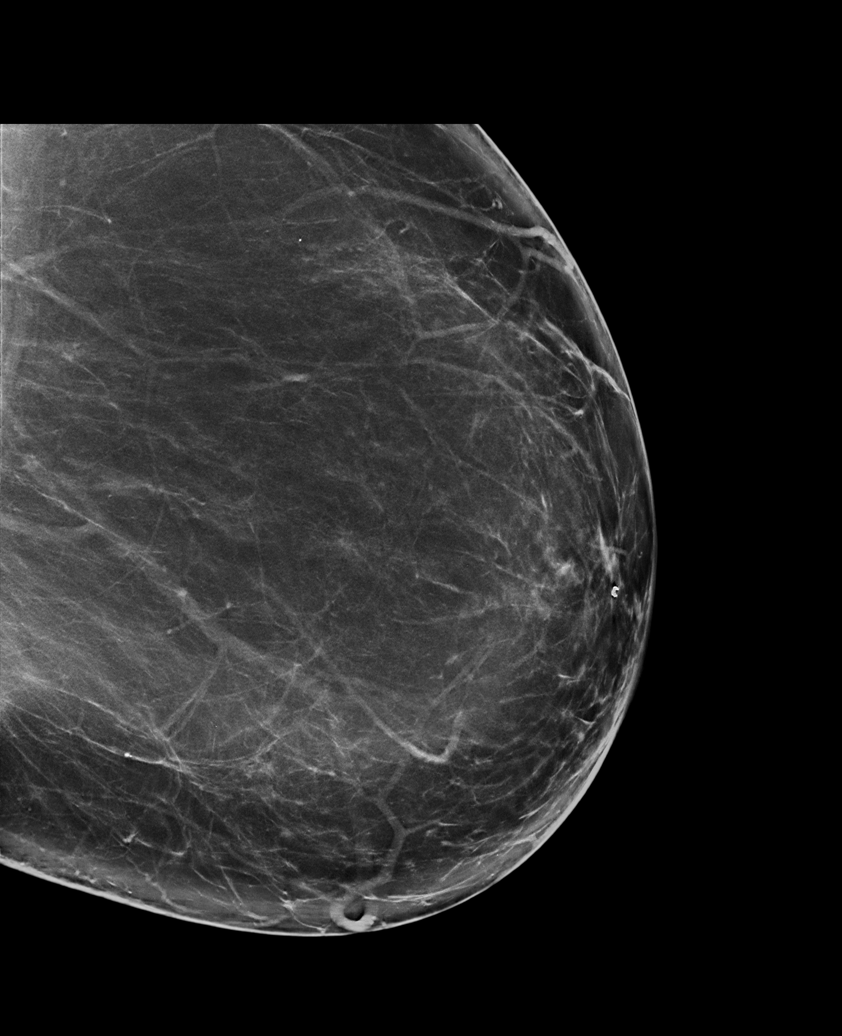

[6 of 36 positions shown; findings below may reference images not displayed]

ACR Breast Density Category b: There are scattered areas of
fibroglandular density.
FINDINGS: There are no findings suspicious for malignancy. Images were
processed with CAD.
IMPRESSION: No mammographic evidence of malignancy. A result letter of this
screening mammogram will be mailed directly to the patient.

RECOMMENDATION:
Screening mammogram in one year. (Code:[TQ])

BI-RADS CATEGORY  1: Negative.

## 2019-07-20 ENCOUNTER — Telehealth: Payer: Self-pay | Admitting: Family Medicine

## 2019-07-20 ENCOUNTER — Other Ambulatory Visit: Payer: Self-pay

## 2019-07-20 NOTE — Telephone Encounter (Signed)
Call to patient to see of patient can come to [facility] earlier than scheduled appointment d/t air conditioning malfunctioning.  Phone was answered by a child that patient was not in. Ask answer to have patient call the health department.  Will call patient back.   Wendi Snipes, RN

## 2021-01-12 ENCOUNTER — Other Ambulatory Visit: Payer: Self-pay | Admitting: Family Medicine

## 2021-01-12 DIAGNOSIS — Z1231 Encounter for screening mammogram for malignant neoplasm of breast: Secondary | ICD-10-CM

## 2021-03-27 ENCOUNTER — Ambulatory Visit
Admission: RE | Admit: 2021-03-27 | Discharge: 2021-03-27 | Disposition: A | Discharge status: Home / Self Care | Payer: Medicaid Other | Source: Ambulatory Visit | Attending: Family Medicine | Admitting: Family Medicine

## 2021-03-27 ENCOUNTER — Other Ambulatory Visit: Payer: Self-pay

## 2021-03-27 DIAGNOSIS — Z1231 Encounter for screening mammogram for malignant neoplasm of breast: Secondary | ICD-10-CM | POA: Insufficient documentation

## 2021-03-27 IMAGING — MG MM DIGITAL SCREENING BILAT W/ TOMO AND CAD
6 of 10 series · 6 of 30 positions shown · non-contrast
Comparison: Previous exam(s).

CLINICAL DATA: Screening.

EXAM:
DIGITAL SCREENING BILATERAL MAMMOGRAM WITH TOMOSYNTHESIS AND CAD
TECHNIQUE: Bilateral screening digital craniocaudal and mediolateral oblique
mammograms were obtained. Bilateral screening digital breast
tomosynthesis was performed. The images were evaluated with
computer-aided detection.

[R MLO synth-2D]
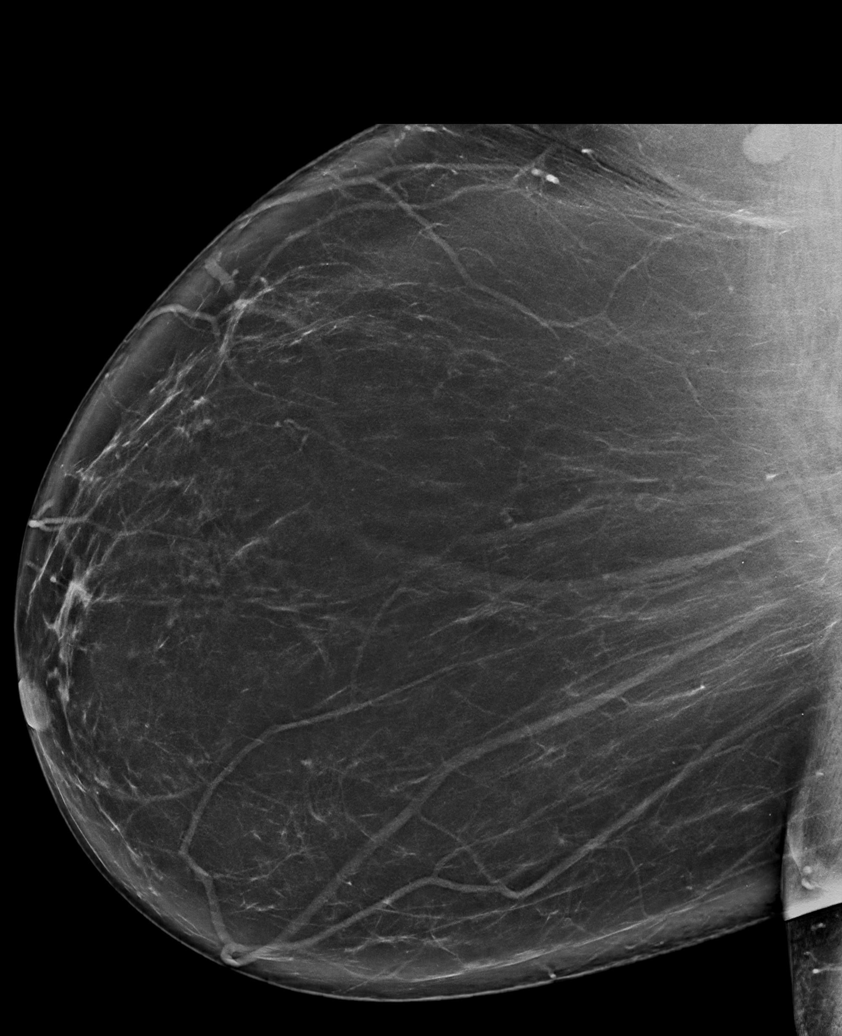

[L CC synth-2D]
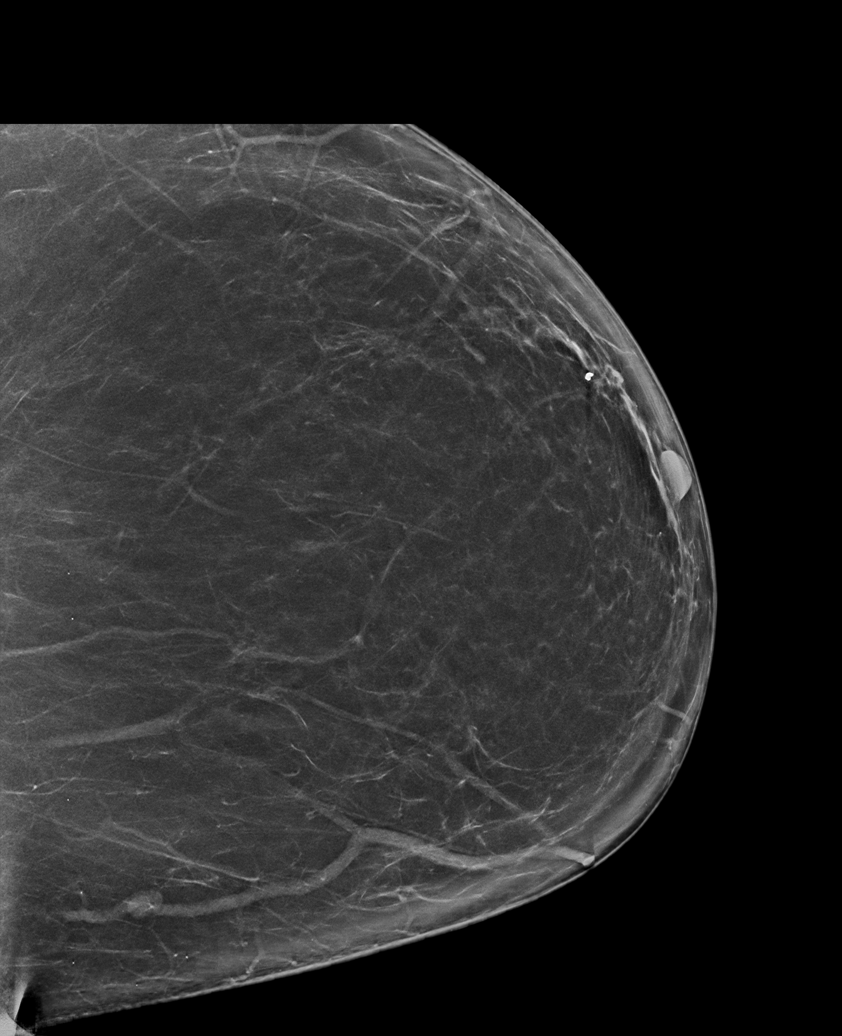

[R CC synth-2D]
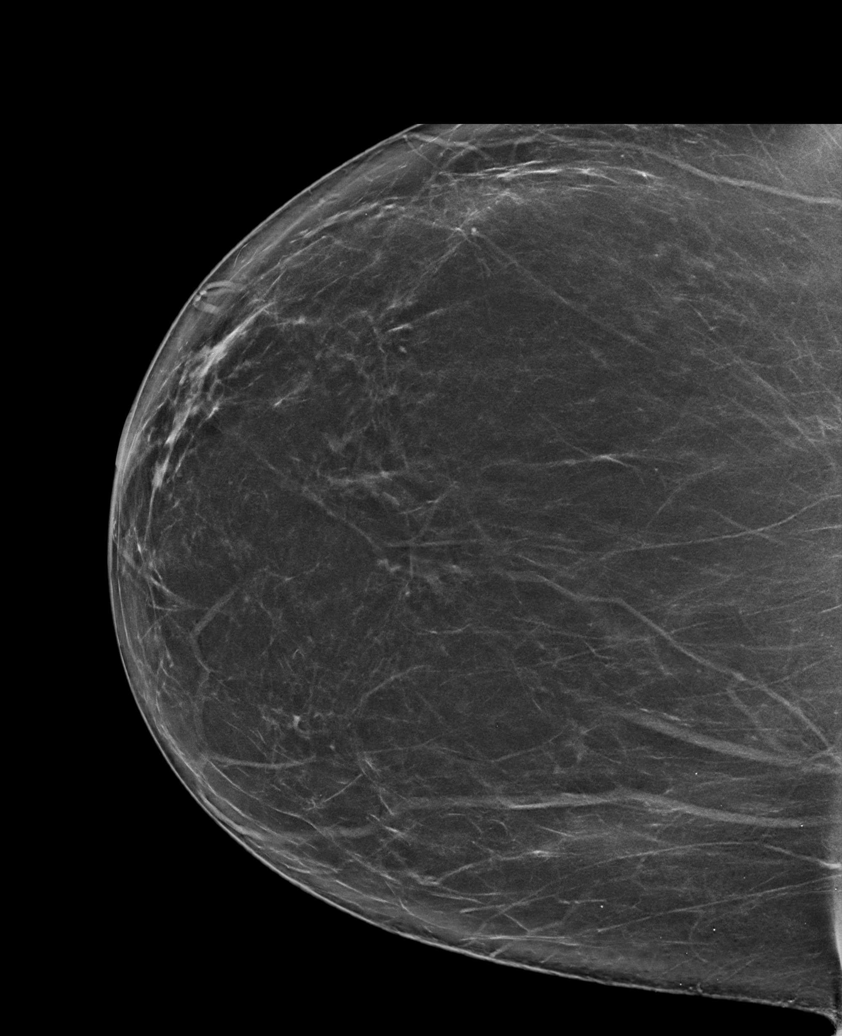

[L MLO synth-2D (1 of 2)]
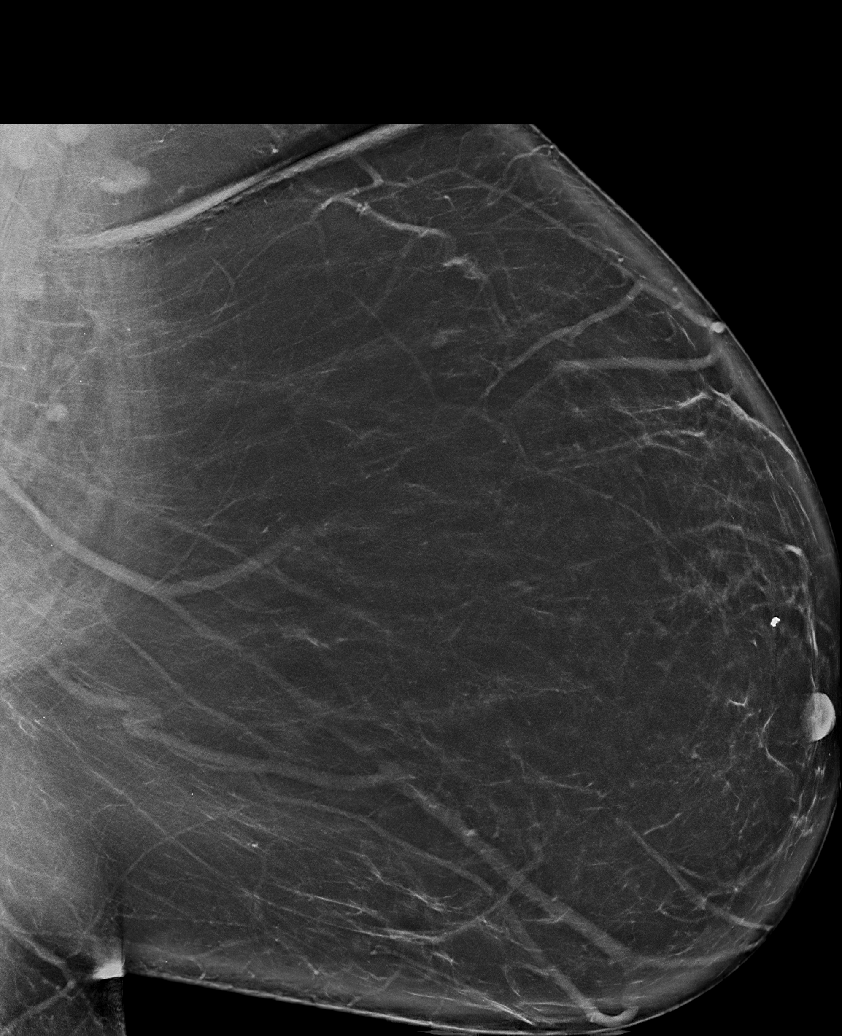

[L MLO synth-2D (2 of 2)]
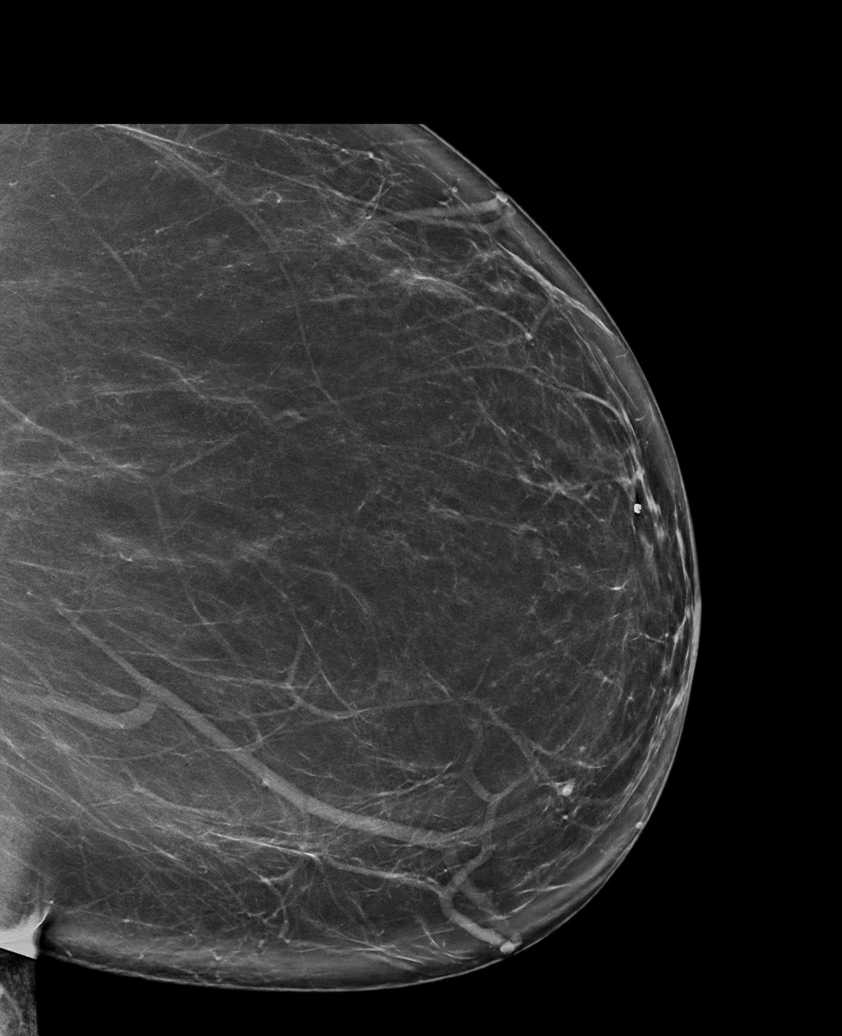

[L CC tomo · tomo slice 46/91.0]
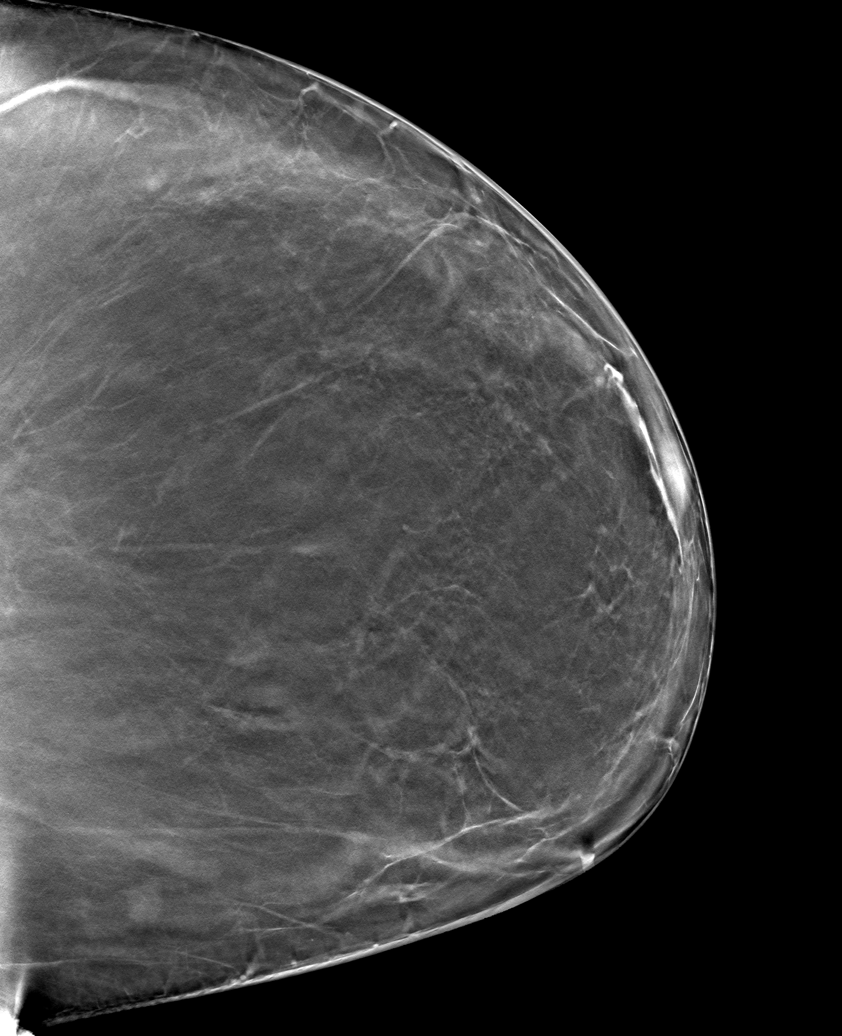

[6 of 30 positions shown; findings below may reference images not displayed]

ACR Breast Density Category b: There are scattered areas of
fibroglandular density.
FINDINGS: There are no findings suspicious for malignancy.
IMPRESSION: No mammographic evidence of malignancy. A result letter of this
screening mammogram will be mailed directly to the patient.

RECOMMENDATION:
Screening mammogram in one year. (Code:[BY])

BI-RADS CATEGORY  1: Negative.

## 2021-05-05 ENCOUNTER — Other Ambulatory Visit: Payer: Self-pay | Admitting: Orthopedic Surgery

## 2021-05-05 DIAGNOSIS — M4807 Spinal stenosis, lumbosacral region: Secondary | ICD-10-CM

## 2021-05-05 DIAGNOSIS — M5136 Other intervertebral disc degeneration, lumbar region: Secondary | ICD-10-CM

## 2021-05-05 DIAGNOSIS — M545 Low back pain, unspecified: Secondary | ICD-10-CM

## 2021-05-05 DIAGNOSIS — M5416 Radiculopathy, lumbar region: Secondary | ICD-10-CM

## 2021-05-11 ENCOUNTER — Other Ambulatory Visit: Payer: Self-pay | Admitting: Orthopedic Surgery

## 2021-05-11 DIAGNOSIS — M4807 Spinal stenosis, lumbosacral region: Secondary | ICD-10-CM

## 2021-05-11 DIAGNOSIS — M5416 Radiculopathy, lumbar region: Secondary | ICD-10-CM

## 2021-05-11 DIAGNOSIS — M5136 Other intervertebral disc degeneration, lumbar region: Secondary | ICD-10-CM

## 2021-05-11 DIAGNOSIS — M545 Low back pain, unspecified: Secondary | ICD-10-CM

## 2021-05-17 ENCOUNTER — Ambulatory Visit
Admission: RE | Admit: 2021-05-17 | Discharge: 2021-05-17 | Disposition: A | Discharge status: Home / Self Care | Payer: Medicaid Other | Source: Ambulatory Visit | Attending: Orthopedic Surgery | Admitting: Orthopedic Surgery

## 2021-05-17 DIAGNOSIS — G8929 Other chronic pain: Secondary | ICD-10-CM

## 2021-05-17 DIAGNOSIS — M4807 Spinal stenosis, lumbosacral region: Secondary | ICD-10-CM

## 2021-05-17 DIAGNOSIS — M5416 Radiculopathy, lumbar region: Secondary | ICD-10-CM

## 2021-05-17 DIAGNOSIS — M5136 Other intervertebral disc degeneration, lumbar region: Secondary | ICD-10-CM

## 2021-05-17 IMAGING — MR MR LUMBAR SPINE W/O CM
4 of 6 series · 22 of 48 positions shown · non-contrast
Comparison: [DATE]

CLINICAL DATA: Chronic low back pain, unspecified laterality; right
lumbar radiculopathy; spinal stenosis of lumbosacral region

EXAM:
MRI LUMBAR SPINE WITHOUT CONTRAST
TECHNIQUE: Multiplanar, multisequence MR imaging of the lumbar spine was
performed. No intravenous contrast was administered.

[Series 6: T2 · coronal · 5.0mm · 0.73mm/px · 7 of 18 slices shown (1 of 3)]
[im 1/18]
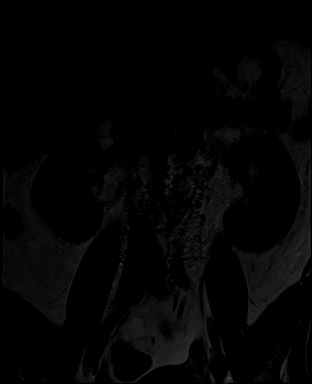
[im 3/18]
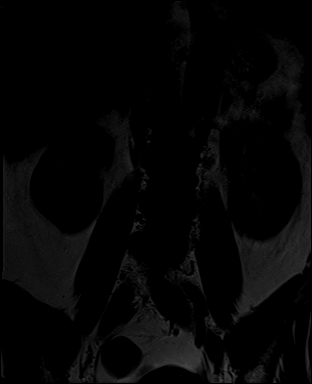
[im 6/18]
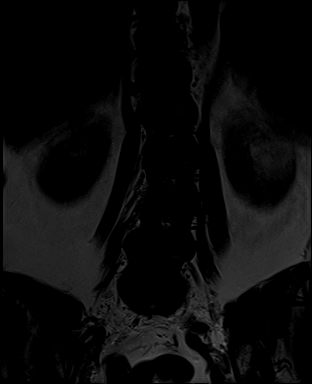
[im 9/18]
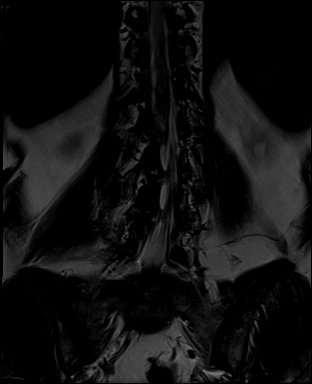
[im 12/18]
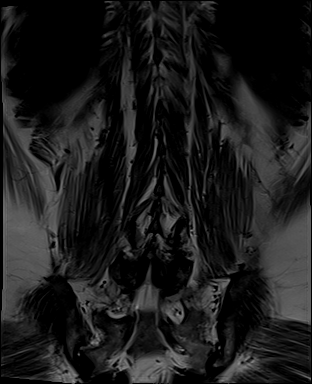
[im 15/18]
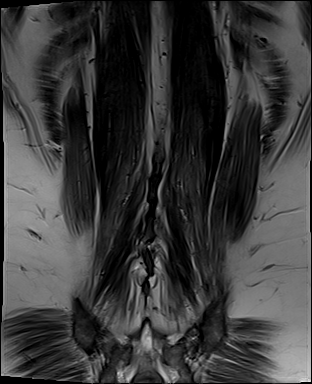
[im 18/18]
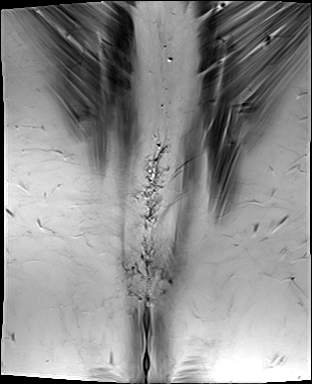

[Series 7: T2 · sagittal · 4.0mm · 0.73mm/px · 5 of 16 slices shown (2 of 3)]
[im 1/16]
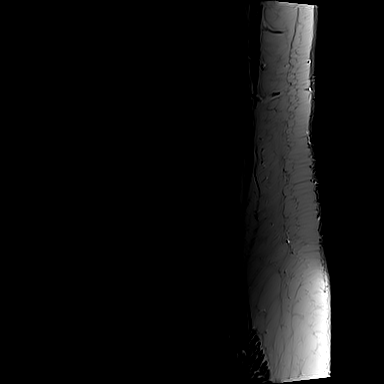
[im 4/16]
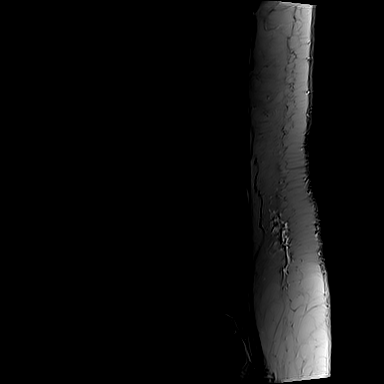
[im 8/16]
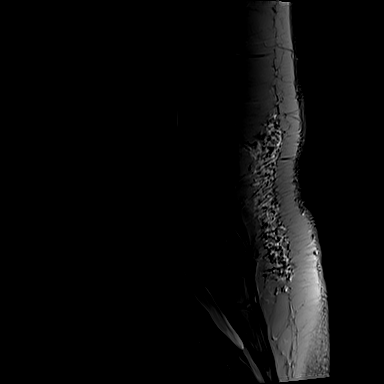
[im 12/16]
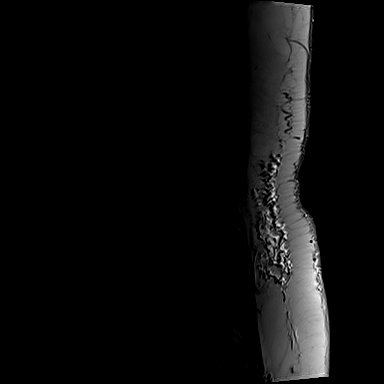
[im 16/16]
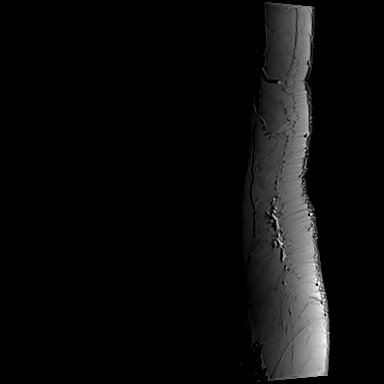

[Series 8: T1 · sagittal · 4.0mm · 0.73mm/px · 3 of 16 slices shown]
[im 1/16]
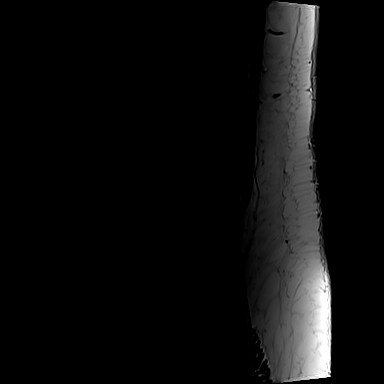
[im 8/16]
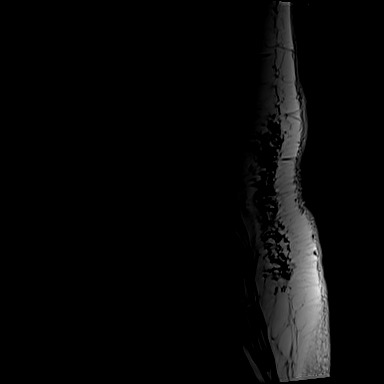
[im 16/16]
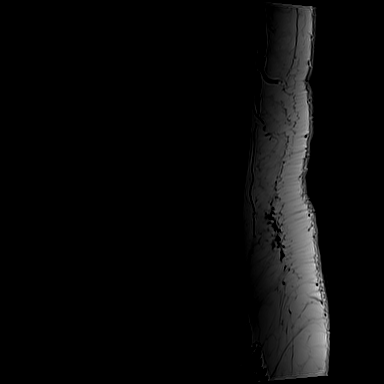

[Series 15: T2 · axial · 4.0mm · 0.28mm/px · z∈[-7,+150]mm · 7 of 39 slices shown (3 of 3)]
[im 1/39]
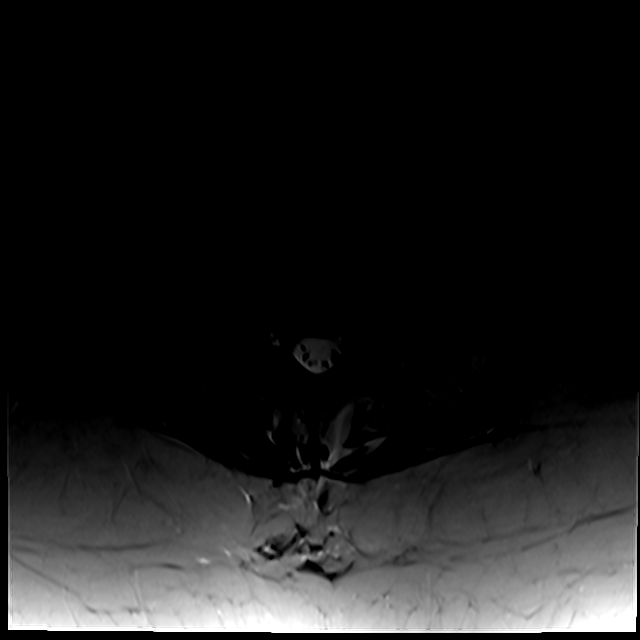
[im 7/39]
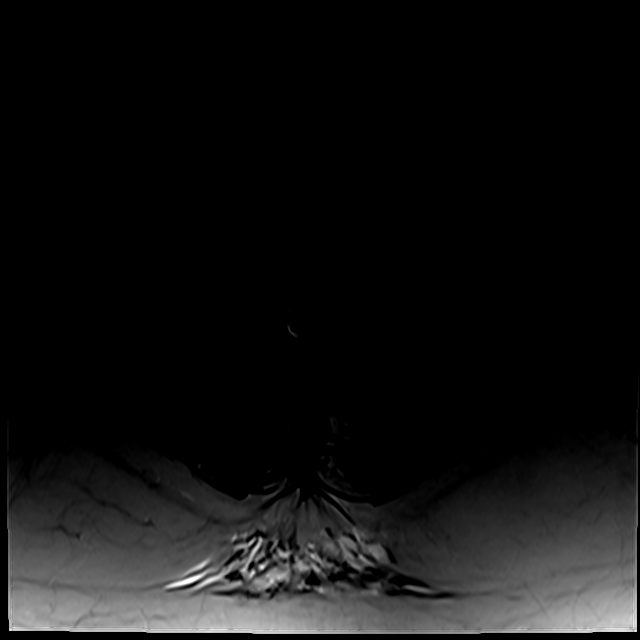
[im 13/39]
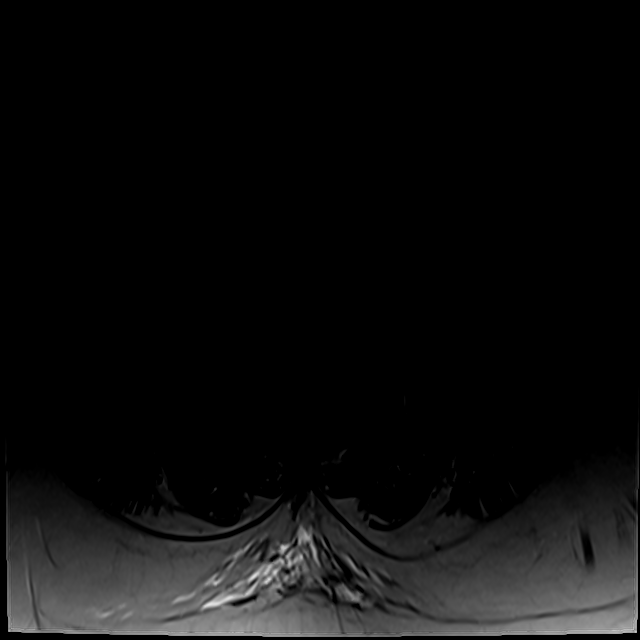
[im 16/39]
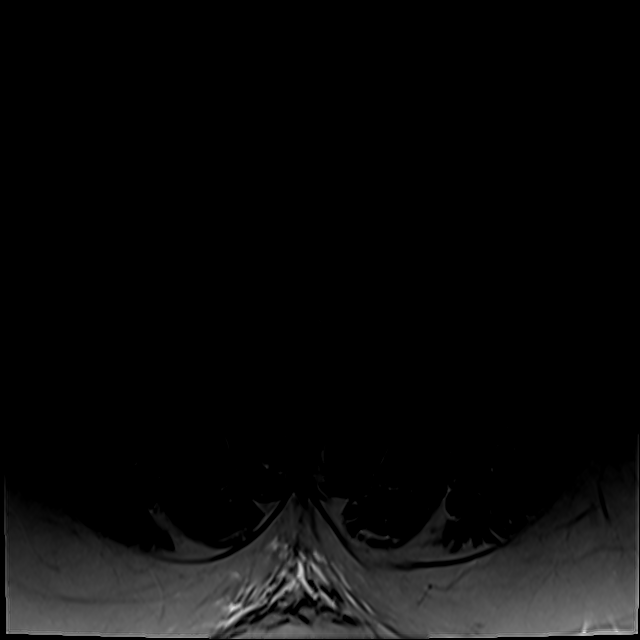
[im 20/39]
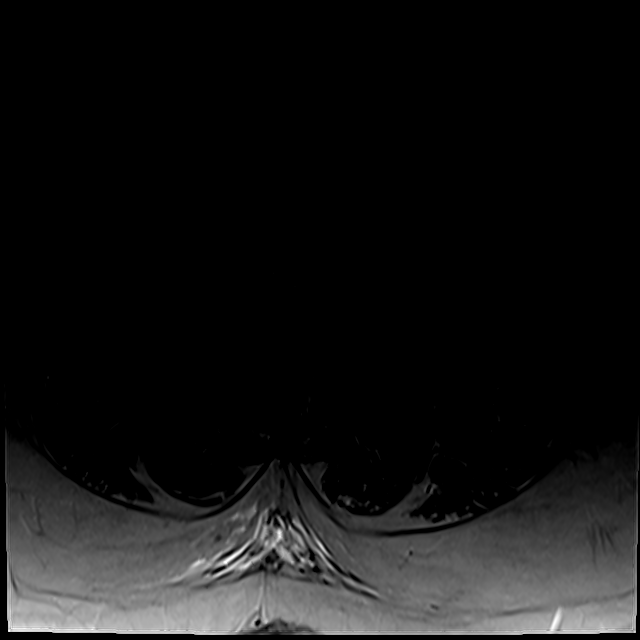
[im 23/39]
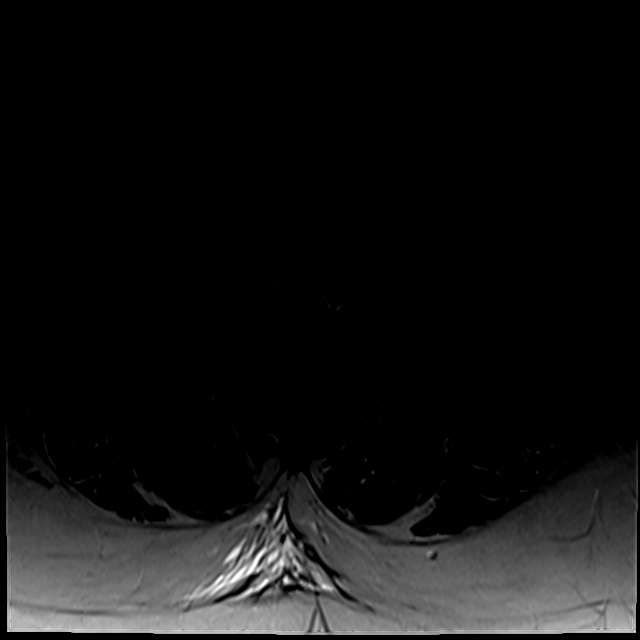
[im 32/39]
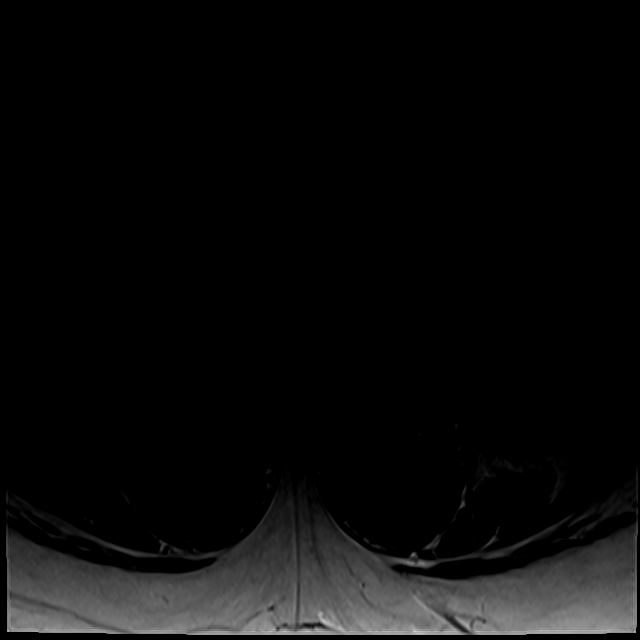

[22 of 48 positions shown; findings below may reference images not displayed]

FINDINGS: Segmentation:  Standard.

Alignment: Stable. Mild levocurvature. Trace anterolisthesis at
L4-L5.

Vertebrae: Vertebral body heights are maintained. Mild degenerative
marrow edema at L5-S1 eccentric to the left.

Conus medullaris and cauda equina: Conus extends to the L2-L3 level.
Conus and cauda equina appear normal.

Paraspinal and other soft tissues: Unremarkable.

Disc levels:

L1-L2:  No canal or foraminal stenosis.

L2-L3:  No canal or foraminal stenosis.

L3-L4:  Minor facet arthropathy.  No canal or foraminal stenosis.

L4-L5: Disc desiccation. Anterolisthesis with uncovering of disc.
Right foraminal protrusion. Moderate facet arthropathy with
ligamentum flavum infolding. No canal or left foraminal stenosis.
Minor right foraminal stenosis. Disc is in proximity to but does not
definitely compress the exiting L4 nerve root.

L5-S1: Disc desiccation. Mild right and moderate left facet
arthropathy. No canal or foraminal stenosis.
IMPRESSION: Lower lumbar degenerative changes as detailed above. Facet
arthropathy has progressed. No new or progressive stenosis.

## 2021-05-25 ENCOUNTER — Ambulatory Visit: Payer: Medicaid Other

## 2022-01-24 ENCOUNTER — Encounter (INDEPENDENT_AMBULATORY_CARE_PROVIDER_SITE_OTHER): Payer: Self-pay

## 2022-02-04 NOTE — Progress Notes (Deleted)
MRN : [id number]  Lindsey Chavez is a 52 y.o. ([birth year]) female who presents with chief complaint of legs swell.  History of Present Illness:   The patient returns to the office for followup evaluation regarding leg swelling.  The swelling has persisted and the pain associated with swelling continues. There have not been any interval development of a ulcerations or wounds.   Since the previous visit the patient has been wearing graduated compression stockings and has noted little if any improvement in the lymphedema. The patient has been using compression routinely morning until night.   The patient also states elevation during the day and exercise is being done too.   ABI's are normal bilaterally Venous duplex is normal deep system and no reflux deep or superficial.  No outpatient medications have been marked as taking for the 02/05/22 encounter (Appointment) with Delana Meyer, Dolores Lory, MD.    Past Medical History:  Diagnosis Date   Asthma    Depressive disorder     Past Surgical History:  Procedure Laterality Date   MOUTH SURGERY     TUBAL LIGATION      Social History Social History   Tobacco Use   Smoking status: Never   Smokeless tobacco: Never  Substance Use Topics   Alcohol use: Not Currently   Drug use: Never    Family History Family History  Problem Relation Age of Onset   Arthritis Mother    Hypertension Mother    Hyperlipidemia Mother    Diabetes Father    Breast cancer Sister 7    No Known Allergies   REVIEW OF SYSTEMS (Negative unless checked)  Constitutional: '[]'$ Weight loss  '[]'$ Fever  '[]'$ Chills Cardiac: '[]'$ Chest pain   '[]'$ Chest pressure   '[]'$ Palpitations   '[]'$ Shortness of breath when laying flat   '[]'$ Shortness of breath with exertion. Vascular:  '[]'$ Pain in legs with walking   '[x]'$ Pain in legs with dependency  '[]'$ History of DVT   '[]'$ Phlebitis   '[x]'$ Swelling in legs   '[x]'$ Varicose veins   '[]'$ Non-healing ulcers Pulmonary:   '[]'$ Uses home oxygen    '[]'$ Productive cough   '[]'$ Hemoptysis   '[]'$ Wheeze  '[]'$ COPD   '[]'$ Asthma Neurologic:  '[]'$ Dizziness   '[]'$ Seizures   '[]'$ History of stroke   '[]'$ History of TIA  '[]'$ Aphasia   '[]'$ Vissual changes   '[]'$ Weakness or numbness in arm   '[]'$ Weakness or numbness in leg Musculoskeletal:   '[]'$ Joint swelling   '[x]'$ Joint pain   '[]'$ Low back pain Hematologic:  '[]'$ Easy bruising  '[]'$ Easy bleeding   '[]'$ Hypercoagulable state   '[]'$ Anemic Gastrointestinal:  '[]'$ Diarrhea   '[]'$ Vomiting  '[]'$ Gastroesophageal reflux/heartburn   '[]'$ Difficulty swallowing. Genitourinary:  '[]'$ Chronic kidney disease   '[]'$ Difficult urination  '[]'$ Frequent urination   '[]'$ Blood in urine Skin:  '[]'$ Rashes   '[]'$ Ulcers  Psychological:  '[]'$ History of anxiety   '[]'$  History of major depression.  Physical Examination  There were no vitals filed for this visit. There is no height or weight on file to calculate BMI. Gen: WD/WN, NAD Head: [place]/AT, No temporalis wasting.  Ear/Nose/Throat: Hearing grossly intact, nares w/o erythema or drainage, pinna without lesions Eyes: PER, EOMI, sclera nonicteric.  Neck: Supple, no gross masses.  No JVD.  Pulmonary:  Good air movement, no audible wheezing, no use of accessory muscles.  Cardiac: RRR, precordium not hyperdynamic. Vascular:  scattered varicosities present bilaterally.  Mild venous stasis changes to the legs bilaterally.  3-4+ soft pitting edema, CEAP C4sEpAsPr  Vessel Right Left  Radial Palpable Palpable  Gastrointestinal: soft, non-distended. No guarding/no peritoneal  signs.  Musculoskeletal: M/S 5/5 throughout.  No deformity.  Neurologic: CN 2-12 intact. Pain and light touch intact in extremities.  Symmetrical.  Speech is fluent. Motor exam as listed above. Psychiatric: Judgment intact, Mood & affect appropriate for pt's clinical situation. Dermatologic: Venous rashes no ulcers noted.  No changes consistent with cellulitis. Lymph : No lichenification or skin changes of chronic lymphedema.  CBC No results found for: "WBC", "HGB", "HCT",  "MCV", "PLT"  BMET No results found for: "NA", "K", "CL", "CO2", "GLUCOSE", "BUN", "CREATININE", "CALCIUM", "GFRNONAA", "GFRAA" CrCl cannot be calculated (No successful lab value found.).  COAG No results found for: "INR", "PROTIME"  Radiology No results found.   Assessment/Plan There are no diagnoses linked to this encounter.   Hortencia Pilar, MD  02/04/2022 1:09 PM

## 2022-02-05 ENCOUNTER — Encounter (INDEPENDENT_AMBULATORY_CARE_PROVIDER_SITE_OTHER): Payer: Medicaid Other | Admitting: Vascular Surgery

## 2022-02-05 DIAGNOSIS — I890 Lymphedema, not elsewhere classified: Secondary | ICD-10-CM

## 2022-02-08 ENCOUNTER — Ambulatory Visit (INDEPENDENT_AMBULATORY_CARE_PROVIDER_SITE_OTHER): Payer: Medicaid Other | Admitting: Vascular Surgery

## 2022-02-08 ENCOUNTER — Encounter (INDEPENDENT_AMBULATORY_CARE_PROVIDER_SITE_OTHER): Payer: Self-pay | Admitting: Vascular Surgery

## 2022-02-08 VITALS — BP 131/72 | HR 82 | Ht 68.0 in | Wt 260.0 lb

## 2022-02-08 DIAGNOSIS — M79604 Pain in right leg: Secondary | ICD-10-CM

## 2022-02-08 DIAGNOSIS — I890 Lymphedema, not elsewhere classified: Secondary | ICD-10-CM

## 2022-02-08 DIAGNOSIS — R600 Localized edema: Secondary | ICD-10-CM

## 2022-02-08 DIAGNOSIS — M79605 Pain in left leg: Secondary | ICD-10-CM | POA: Diagnosis not present

## 2022-02-09 ENCOUNTER — Encounter (INDEPENDENT_AMBULATORY_CARE_PROVIDER_SITE_OTHER): Payer: Self-pay | Admitting: Vascular Surgery

## 2022-02-09 NOTE — Progress Notes (Signed)
MRN : [id number]  Lindsey Chavez is a 52 y.o. (02-Aug-1970) female who presents with chief complaint of legs swell.  History of Present Illness:   Patient is seen for evaluation of leg pain and leg swelling. The patient first noticed the swelling remotely. The swelling is associated with pain and discoloration. The pain and swelling worsens with prolonged dependency and improves with elevation. The pain is unrelated to activity.  The patient notes that in the morning the legs are significantly improved but they steadily worsened throughout the course of the day. The patient also notes a steady worsening of the discoloration in the ankle and shin area.   The patient denies claudication symptoms.  The patient denies symptoms consistent with rest pain.  The patient denies and extensive history of DJD and LS spine disease.  The patient has no had any past angiography, interventions or vascular surgery.  Elevation makes the leg symptoms better, dependency makes them much worse. There is no history of ulcerations. The patient denies any recent changes in medications.  The patient has not been wearing graduated compression.  The patient denies a history of DVT or PE. There is no prior history of phlebitis. There is no history of primary lymphedema.  No history of malignancies. No history of trauma or groin or pelvic surgery. There is no history of radiation treatment to the groin or pelvis  The patient denies amaurosis fugax or recent TIA symptoms. There are no recent neurological changes noted. The patient denies recent episodes of angina or shortness of breath  Current Meds  Medication Sig   ARIPiprazole (ABILIFY) 10 MG tablet Take 10 mg by mouth daily.   budesonide-formoterol (SYMBICORT) 160-4.5 MCG/ACT inhaler Inhale 2 puffs into the lungs 2 (two) times daily.   busPIRone (BUSPAR) 10 MG tablet Take 10 mg by mouth 3 (three) times daily.   doxepin (SINEQUAN) 10 MG capsule Take 10 mg  by mouth at bedtime.   escitalopram (LEXAPRO) 10 MG tablet TAKE 1 TABLET BY MOUTH ONCE DAILY FOR MOOD   escitalopram (LEXAPRO) 20 MG tablet Take 20 mg by mouth daily.   methocarbamol (ROBAXIN) 500 MG tablet Take 500 mg by mouth 4 (four) times daily.   naproxen (NAPROSYN) 500 MG tablet Take 500 mg by mouth 2 (two) times daily with a meal.   PROAIR HFA 108 (90 Base) MCG/ACT inhaler INHALE 2 PUFFS INTO LUNGS EVERY 4 TO 6 HOURS AS NEEDED FOR WHEEZING   risperiDONE (RISPERDAL) 1 MG tablet Take 1 mg by mouth 2 (two) times daily.   tiZANidine (ZANAFLEX) 4 MG tablet Take 4 mg by mouth at bedtime.    Past Medical History:  Diagnosis Date   Asthma    Depressive disorder     Past Surgical History:  Procedure Laterality Date   MOUTH SURGERY     TUBAL LIGATION      Social History Social History   Tobacco Use   Smoking status: Never   Smokeless tobacco: Never  Substance Use Topics   Alcohol use: Not Currently   Drug use: Never    Family History Family History  Problem Relation Age of Onset   Arthritis Mother    Hypertension Mother    Hyperlipidemia Mother    Diabetes Father    Breast cancer Sister 56    Allergies  Allergen Reactions   Pork-Derived Products Hives     REVIEW OF SYSTEMS (Negative unless checked)  Constitutional: [] Weight loss  [] Fever  [] Chills Cardiac: [] Chest  pain   [] Chest pressure   [] Palpitations   [] Shortness of breath when laying flat   [] Shortness of breath with exertion. Vascular:  [] Pain in legs with walking   [x] Pain in legs with standing  [] History of DVT   [] Phlebitis   [x] Swelling in legs   [] Varicose veins   [] Non-healing ulcers Pulmonary:   [] Uses home oxygen   [] Productive cough   [] Hemoptysis   [] Wheeze  [] COPD   [] Asthma Neurologic:  [] Dizziness   [] Seizures   [] History of stroke   [] History of TIA  [] Aphasia   [] Vissual changes   [] Weakness or numbness in arm   [] Weakness or numbness in leg Musculoskeletal:   [] Joint swelling   [] Joint pain    [] Low back pain Hematologic:  [] Easy bruising  [] Easy bleeding   [] Hypercoagulable state   [] Anemic Gastrointestinal:  [] Diarrhea   [] Vomiting  [] Gastroesophageal reflux/heartburn   [] Difficulty swallowing. Genitourinary:  [] Chronic kidney disease   [] Difficult urination  [] Frequent urination   [] Blood in urine Skin:  [] Rashes   [] Ulcers  Psychological:  [] History of anxiety   []  History of major depression.  Physical Examination  Vitals:   02/08/22 1125  BP: 131/72  Pulse: 82  Weight: 260 lb (117.9 kg)  Height: 5\' 8"  (1.727 m)   Body mass index is 39.53 kg/m. Gen: WD/WN, NAD Head: [place]/AT, No temporalis wasting.  Ear/Nose/Throat: Hearing grossly intact, nares w/o erythema or drainage, pinna without lesions Eyes: PER, EOMI, sclera nonicteric.  Neck: Supple, no gross masses.  No JVD.  Pulmonary:  Good air movement, no audible wheezing, no use of accessory muscles.  Cardiac: RRR, precordium not hyperdynamic. Vascular:  scattered varicosities present bilaterally.  Mild venous stasis changes to the legs bilaterally.  3-4+ soft pitting edema, CEAP C4sEpAsPr  Vessel Right Left  Radial Palpable Palpable  Gastrointestinal: soft, non-distended. No guarding/no peritoneal signs.  Musculoskeletal: M/S 5/5 throughout.  No deformity.  Neurologic: CN 2-12 intact. Pain and light touch intact in extremities.  Symmetrical.  Speech is fluent. Motor exam as listed above. Psychiatric: Judgment intact, Mood & affect appropriate for pt's clinical situation. Dermatologic: Venous rashes no ulcers noted.  No changes consistent with cellulitis. Lymph : No lichenification or skin changes of chronic lymphedema.  CBC No results found for: "WBC", "HGB", "HCT", "MCV", "PLT"  BMET No results found for: "NA", "K", "CL", "CO2", "GLUCOSE", "BUN", "CREATININE", "CALCIUM", "GFRNONAA", "GFRAA" CrCl cannot be calculated (No successful lab value found.).  COAG No results found for: "INR", "PROTIME"  Radiology No  results found.   Assessment/Plan 1. Pain in both lower extremities Recommend:  I have had a long discussion with the patient regarding swelling and why it  causes symptoms.  Patient will begin wearing graduated compression on a daily basis a prescription was given. The patient will  wear the stockings first thing in the morning and removing them in the evening. The patient is instructed specifically not to sleep in the stockings.   In addition, behavioral modification will be initiated.  This will include frequent elevation, use of over the counter pain medications and exercise such as walking.  Consideration for a lymph pump will also be made based upon the effectiveness of conservative therapy.  This would help to improve the edema control and prevent sequela such as ulcers and infections   Patient should undergo duplex ultrasound of the venous system to ensure that DVT or reflux is not present.  The patient will follow-up with me after the ultrasound.  - VAS  [place] LOWER EXTREMITY VENOUS REFLUX; Future  2. Bilateral lower extremity edema Recommend:  I have had a long discussion with the patient regarding swelling and why it  causes symptoms.  Patient will begin wearing graduated compression on a daily basis a prescription was given. The patient will  wear the stockings first thing in the morning and removing them in the evening. The patient is instructed specifically not to sleep in the stockings.   In addition, behavioral modification will be initiated.  This will include frequent elevation, use of over the counter pain medications and exercise such as walking.  Consideration for a lymph pump will also be made based upon the effectiveness of conservative therapy.  This would help to improve the edema control and prevent sequela such as ulcers and infections   Patient should undergo duplex ultrasound of the venous system to ensure that DVT or reflux is not present.  The patient will  follow-up with me after the ultrasound.  - VAS [place] LOWER EXTREMITY VENOUS REFLUX; Future  3. Lymphedema Recommend:  I have had a long discussion with the patient regarding swelling and why it  causes symptoms.  Patient will begin wearing graduated compression on a daily basis a prescription was given. The patient will  wear the stockings first thing in the morning and removing them in the evening. The patient is instructed specifically not to sleep in the stockings.   In addition, behavioral modification will be initiated.  This will include frequent elevation, use of over the counter pain medications and exercise such as walking.  Consideration for a lymph pump will also be made based upon the effectiveness of conservative therapy.  This would help to improve the edema control and prevent sequela such as ulcers and infections   Patient should undergo duplex ultrasound of the venous system to ensure that DVT or reflux is not present.  The patient will follow-up with me after the ultrasound.  - VAS [place] LOWER EXTREMITY VENOUS REFLUX; Future    Hortencia Pilar, MD  02/09/2022 2:24 PM

## 2022-02-26 ENCOUNTER — Other Ambulatory Visit: Payer: Self-pay | Admitting: Family Medicine

## 2022-02-26 DIAGNOSIS — Z1231 Encounter for screening mammogram for malignant neoplasm of breast: Secondary | ICD-10-CM

## 2022-03-29 ENCOUNTER — Ambulatory Visit
Admission: RE | Admit: 2022-03-29 | Discharge: 2022-03-29 | Disposition: A | Discharge status: Home / Self Care | Payer: Medicaid Other | Source: Ambulatory Visit | Attending: Family Medicine | Admitting: Family Medicine

## 2022-03-29 DIAGNOSIS — Z1231 Encounter for screening mammogram for malignant neoplasm of breast: Secondary | ICD-10-CM | POA: Insufficient documentation

## 2022-04-09 ENCOUNTER — Ambulatory Visit (INDEPENDENT_AMBULATORY_CARE_PROVIDER_SITE_OTHER): Payer: Medicaid Other

## 2022-04-09 ENCOUNTER — Ambulatory Visit (INDEPENDENT_AMBULATORY_CARE_PROVIDER_SITE_OTHER): Payer: Medicaid Other | Admitting: Vascular Surgery

## 2022-04-09 DIAGNOSIS — M79605 Pain in left leg: Secondary | ICD-10-CM | POA: Diagnosis not present

## 2022-04-09 DIAGNOSIS — M79604 Pain in right leg: Secondary | ICD-10-CM

## 2022-04-09 DIAGNOSIS — I890 Lymphedema, not elsewhere classified: Secondary | ICD-10-CM | POA: Diagnosis not present

## 2022-04-09 DIAGNOSIS — R600 Localized edema: Secondary | ICD-10-CM | POA: Diagnosis not present

## 2022-05-15 ENCOUNTER — Ambulatory Visit: Payer: Medicaid Other | Attending: Neurology | Admitting: Physical Therapy

## 2022-05-15 ENCOUNTER — Encounter: Payer: Self-pay | Admitting: Physical Therapy

## 2022-05-15 DIAGNOSIS — M5459 Other low back pain: Secondary | ICD-10-CM | POA: Diagnosis present

## 2022-05-15 DIAGNOSIS — M6281 Muscle weakness (generalized): Secondary | ICD-10-CM | POA: Diagnosis present

## 2022-05-15 DIAGNOSIS — R293 Abnormal posture: Secondary | ICD-10-CM | POA: Insufficient documentation

## 2022-05-15 DIAGNOSIS — R269 Unspecified abnormalities of gait and mobility: Secondary | ICD-10-CM

## 2022-05-15 NOTE — Therapy (Signed)
OUTPATIENT PHYSICAL THERAPY THORACOLUMBAR EVALUATION   Patient Name: Lindsey Chavez MRN: [id number] DOB:1970/08/18, 52 y.o., female Today's Date: 05/15/2022  END OF SESSION:  PT End of Session - 05/15/22 1242     Visit Number 1    Number of Visits 16    Date for PT Re-Evaluation 07/10/22    PT Start Time 0810    PT Stop Time 0904    PT Time Calculation (min) 54 min             Past Medical History:  Diagnosis Date   Asthma    Depressive disorder    Past Surgical History:  Procedure Laterality Date   MOUTH SURGERY     TUBAL LIGATION     Patient Active Problem List   Diagnosis Date Noted   Lymphedema 12/19/2017   Pain in both lower extremities 08/19/2017   Bilateral lower extremity edema 08/19/2017    PCP: Lindsey Morgan, MD  REFERRING PROVIDER: Morene Crocker, MD  REFERRING DIAG: Other abnormalities of gait and mobility    Chronic low back pain  Rationale for Evaluation and Treatment: Rehabilitation  THERAPY DIAG:  Other low back pain  Abnormal posture  Muscle weakness (generalized)  Gait difficulty  ONSET DATE: Chronic  SUBJECTIVE:                                                                                                                                                                                           SUBJECTIVE STATEMENT: Pt. Reports chronic h/o low back pain due to scoliosis and bulging discs.  Pt. Has not worked since 2022 due to back pain and receives disability.  Pt. Reports no falls and no previous back surgeries.  Pt. Unable to drive due to back pain.  Pt. Takes Lyrica for pain and sleeps in recliner due to increase pain in bed.  Pt. Lives with 3 sons and requires SPC/ use of grab bar for safety.  Injection with Dr. Mariah Chavez 6/24  PERTINENT HISTORY:  REFERRING PHYSICIAN: Aycock, Ngwe Chavez* PRIMARY CARE PHYSICIAN: Aycock, Ngwe Achirimofor, MD  IMPRESSION/PLAN  Lindsey Chavez is a 52 y.o. female presenting for evaluation  of  NUMBNESS/ TINGLING/ BACK PAIN/ DIFFICULTY SLEEPING/ DEPRESSION  - Unchanged. - Patient with PMH of HTN, lymphedema, lumbar radiculopathy, with ongoing neuropathy pain. Taking Lyrica 25 mg twice daily and 100 mg at night -Labs from last visit showed A1c is in prediabetic range. Alk phos also slightly elevated. Follow up with PCP. -Reviewed note from vascular recommending compression hose, elevating lower extremities, consider lymph follow-up, recommended ultrasound lower extremities. - Provided prescription for 3 point cane -Referral to gait  and balance physical therapy. -Change Lyrica to 25 mg in the morning, 25 mg in the afternoon and 100 mg at night.  -Continue to stay physically active. -Can start vitamin D 1000 units once daily. -Referral for sleep study to evaluate daytime fatigue. -Continue following with vascular. Continue wearing compression hose and elevating legs. -Alpha-lipoic acid is a naturally occurring, biological antioxidant, which might help with many chronic diseases, mostly diabetic peripheral neuropathy but can help other types of neuropathies as well. Usually given 600 mg/day, may take 3-5 wks before some symptomatic relief. Diabetic patients may need to reduce their diabetic meds. It may exacerbate thiamine deficiency in pt's with chronic malnutrition e.g. alcoholism  Follow up in 2-27mo  Medications previously tried:  CHIEF COMPLAINT & HPI  Lindsey Chavez is a 52 y.o. female presenting for evaluation of: Chief Complaint  Patient presents with  Numbness  Tingling  Back Pain  Sleeping Problem  Depression   NUMBNESS/ TINGLING/ BACK PAIN/ DIFFICULTY SLEEPING/ DEPRESSION  Patient reports with ongoing numbness and tingling in bilateral legs. Notes upcoming [place] LE in April per vascular recommendations. Following visit with vascular specialist, she was recommended to wear compression hose throughout the day and remove them before bed and to continue to elevate bilateral  legs. Notes ongoing swelling in bilateral legs. States she continues to have more frequent numbness, tingling and pain in right leg.Taking Lyrica to 25 mg in the morning, 25 mg in the afternoon and 100 mg at night. He is tolerating this well. Feels this is helpful with symptoms. States continuing to see physiatry for injections in spine. Notes reducing salt intake and getting daily exercise. Notes she spoke with nutritionist and is working on weight loss. Notes ongoing difficulty falling asleep and staying asleep. Reports getting 5 hours of sleep at night. States occasional drowsiness during the day. Notes ongoing appointment with therapist. Reports current mood is stable but ongoing depression. Endorses fatigue and low energy. No recent falls but feels that balance is not doing well. Needs new prescription for cane.  DATA SUMMARY: 12/05/2021 EMG LOWERS  Impression: Abnormal study. 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   05/17/2021 MRI LUMBAR SPINE WO CONTRAST  IMPRESSION:  Lower lumbar degenerative changes as detailed above. Facet  arthropathy has progressed. No new or progressive stenosis.   VISIT SUMMARIES:  MEDICATIONS Outpatient Medications Marked as Taking for the 02/20/22 encounter (Office Visit) with Lindsey Mink, PA  Medication Sig  albuterol 90 mcg/actuation inhaler Inhale 2 inhalations into the lungs every 4 (four) hours as needed for Wheezing or Shortness of Breath  ARIPiprazole (ABILIFY) 10 MG tablet Take 10 mg by mouth once daily  busPIRone (BUSPAR) 10 MG tablet Take 10 mg by mouth 3 (three) times daily  diazePAM (VALIUM) 5 MG tablet 1-2 30 minutes before ESI  methocarbamoL (ROBAXIN) 500 MG tablet Take 500 mg by mouth 4 (four) times daily  pregabalin (LYRICA) 25 MG capsule Take 25 mg in the morning 25 mg in the afternoon and 100 mg at night  risperiDONE (RISPERDAL) 1 MG tablet Take 2 mg by mouth  nightly   ALLERGIES Allergies  Allergen Reactions  Pork/Porcine Containing Products Hives   EXAM   Vitals:  02/20/22 0902  BP: 108/77  Pulse: 72  Weight: (!) 118.8 kg (262 lb)  PainSc: 7  PainLoc: Leg   Body mass index is 39.84 kg/m.  GENERAL: Pleasant female, no distress. Normocephalic and atraumatic.  Baseline neurological exam  below was obtained at prior office visit. Changes from today's visit appear in bold.   EYES: PERRLA EOM's intact  MUSCULOSKELETAL: Bulk - Normal Tone - Normal Pronator Drift - Absent bilaterally. Ambulation - Gait and station is slow, antalgic, limping.  Romberg - absent  R/L 5/5 Shoulder abduction (deltoid/supraspinatus, axillary/suprascapular n, C5) 5/5 Elbow flexion (biceps brachii, musculoskeletal n, C5-6) 5/5 Elbow extension (triceps, radial n, C7) 5/5 Finger adduction (interossei, ulnar n, T1)  4/5 Hip flexion (iliopsoas, L1/L2) 4/5 Knee flexion (hamstrings, sciatic n, L5/S1)  4/5 Knee extension (quadriceps, femoral n, L3/4) 5/5 Ankle dorsiflexion (tibialis anterior, deep fibular n, L4/5) 5/5 Ankle plantarflexion (gastroc, tibial n, S1)  Exam limited by pain.   NEUROLOGICAL: MENTAL STATUS: Patient is oriented to person, place and time.  Short-term memory is intact Long-term memory is intact.  Attention span and concentration are intact.  Naming and repetition are intact. Comprehension is intact.  Expressive speech is intact.  Patient's fund of knowledge is within normal limits for educational level.  CRANIAL NERVES: Visual acuity and visual fields are intact  Extraocular muscles are intact  Facial sensation is intact bilaterally  Facial strength is intact bilaterally  Hearing is intact bilaterally  Palate elevates midline, normal phonation  Shoulder shrug strength is intact  Tongue protrudes midline   SENSATION: Pain and temperature (spinothalamic tracts) is normal. Position and vibration (dorsal columns) is  normal.  COORDINATION/CEREBELLAR: Finger to nose testing is intact.  REFLEXES:  Negative Hoffman's sign bilaterally.   PAST MEDICAL HISTORY Past Medical History:  Diagnosis Date  Asthma, unspecified asthma severity, unspecified whether complicated, unspecified whether persistent  Chest pain  Depression  ILD (interstitial lung disease) (CMS-HCC)  Major depressive disorder  Sickle cell trait (CMS-HCC)   PAST SURGICAL HISTORY Past Surgical History:  Procedure Laterality Date  dental surgery  LAPAROSCOPIC TUBAL LIGATION   PAIN:  Are you having pain? Yes: NPRS scale: 8/10 Pain location: R upper thigh Pain description: persistent Aggravating factors: standing/ walking Relieving factors: recliner/ medications  PRECAUTIONS: None  WEIGHT BEARING RESTRICTIONS: No  FALLS:  Has patient fallen in last 6 months? No  LIVING ENVIRONMENT: Lives with: lives with their family and lives with an adult companion Lives in: House/apartment Stairs: No Has following equipment at home: Single point cane, Tour manager, and Grab bars  OCCUPATION: Disability  PLOF: Independent  PATIENT GOALS: Improve walking/ safety.  Decrease low back pain.    NEXT MD VISIT: 06/08/22  OBJECTIVE:   DIAGNOSTIC FINDINGS:  CLINICAL DATA:  Chronic low back pain, unspecified laterality; right lumbar radiculopathy; spinal stenosis of lumbosacral region   EXAM: MRI LUMBAR SPINE WITHOUT CONTRAST   TECHNIQUE: Multiplanar, multisequence MR imaging of the lumbar spine was performed. No intravenous contrast was administered.   COMPARISON:  03/04/2018   FINDINGS: Segmentation:  Standard.   Alignment: Stable. Mild levocurvature. Trace anterolisthesis at L4-L5.   Vertebrae: Vertebral body heights are maintained. Mild degenerative marrow edema at L5-S1 eccentric to the left.   Conus medullaris and cauda equina: Conus extends to the L2-L3 level. Conus and cauda equina appear normal.   Paraspinal and  other soft tissues: Unremarkable.   Disc levels:   L1-L2:  No canal or foraminal stenosis.   L2-L3:  No canal or foraminal stenosis.   L3-L4:  Minor facet arthropathy.  No canal or foraminal stenosis.   L4-L5: Disc desiccation. Anterolisthesis with uncovering of disc. Right foraminal protrusion. Moderate facet arthropathy with ligamentum flavum infolding. No canal or left foraminal stenosis. Minor right  foraminal stenosis. Disc is in proximity to but does not definitely compress the exiting L4 nerve root.   L5-S1: Disc desiccation. Mild right and moderate left facet arthropathy. No canal or foraminal stenosis.   IMPRESSION: Lower lumbar degenerative changes as detailed above. Facet arthropathy has progressed. No new or progressive stenosis.     Electronically Signed   By: Guadlupe Spanish M.D.   On: 05/17/2021 13:59  PATIENT SURVEYS:  FOTO initial 45/ goal 54  SCREENING FOR RED FLAGS: Bowel or bladder incontinence: No Spinal tumors: No Cauda equina syndrome: No Compression fracture: No Abdominal aneurysm: No  COGNITION: Overall cognitive status: Within functional limits for tasks assessed     SENSATION: WFL  MUSCLE LENGTH: Hamstrings: Right 60 deg; Left 60 deg  (severe pain reported) Maisie Fus test: NT  (pain getting into position)  POSTURE: rounded shoulders, forward head, and flexed trunk   PALPATION: Generalized lumbar hypomobility/ tenderness with palpation.  LUMBAR ROM:   Stands in flexed posture (25 deg.)- pain limited with correction.  Limited/ poor standing tolerance and required 2 seated rest breaks during lumbar testing.   AROM eval  Flexion 25% limited   Extension 100% limited (pain)  Right lateral flexion 50% limited (pain)  Left lateral flexion 50% limited (pain)  Right rotation 50% limited (pain)  Left rotation 50% limited (pain)   (Blank rows = not tested)  LOWER EXTREMITY ROM:      B LE AROM WFL except hip extension (pain)  LOWER  EXTREMITY MMT:    MMT Right eval Left eval  Hip flexion 3+ (pain) 3+  Hip extension Unable to test Unable to test  Hip abduction 3 3+  Hip adduction 4 4  Hip internal rotation 4 4  Hip external rotation 4 4  Knee flexion 4 (LBP) 4  Knee extension 4 (LBP) 4  Ankle dorsiflexion    Ankle plantarflexion    Ankle inversion    Ankle eversion     (Blank rows = not tested)  LUMBAR SPECIAL TESTS:  Straight leg raise test: Positive and FABER test: Positive  FUNCTIONAL TESTS:  5 times sit to stand: TBD Berg Balance Scale: TBD  GAIT: Distance walked: in clinic Assistive device utilized: Single point cane  PT discussed use of rollator/ demonstrates Level of assistance: Modified independence Comments: Marked antalgic gait with limited hip/knee flexion and heavy B UE assist with SPC or rollator.  Poor endurance.  R LE muscle weakness/ fatigue reported.   TODAY'S TREATMENT:                                                                                                                              DATE: 05/15/2022  See evaluation    PATIENT EDUCATION:  Education details: Use of rollator/ upright posture Person educated: Patient Education method: Explanation and Demonstration Education comprehension: verbalized understanding, returned demonstration, and needs further education  HOME EXERCISE PROGRAM: TBD  ASSESSMENT:  CLINICAL IMPRESSION: Patient is a pleasant  52 y.o. female who was seen today for physical therapy evaluation and treatment for gait/balance issues and chronic low back pain.  Pt. Limited with standing posture and prefers flexed upright posture.  No standing lumbar extension and frequent seated rest breaks.  Pt. Currently uses SPC with gait and may benefit from use of rollator for safety.  Moderate B LE muscle weakness with marked increase in R LE pain/ fatigue.  Pt. Will benefit from skilled PT services to develop HEP to increase LE muscle strength/ upright posture to  improve walking/ functional tasks.   OBJECTIVE IMPAIRMENTS: Abnormal gait, decreased activity tolerance, decreased balance, decreased coordination, decreased endurance, decreased knowledge of use of DME, decreased mobility, difficulty walking, decreased ROM, decreased strength, decreased safety awareness, hypomobility, impaired flexibility, improper body mechanics, postural dysfunction, and pain.   ACTIVITY LIMITATIONS: carrying, lifting, bending, sitting, standing, squatting, sleeping, stairs, transfers, bed mobility, bathing, toileting, dressing, and locomotion level  PARTICIPATION LIMITATIONS: meal prep, cleaning, laundry, driving, shopping, and community activity  PERSONAL FACTORS: Fitness and Past/current experiences are also affecting patient's functional outcome.   REHAB POTENTIAL: Good  CLINICAL DECISION MAKING: Unstable/unpredictable  EVALUATION COMPLEXITY: High   GOALS: Goals reviewed with patient? Yes  SHORT TERM GOALS: Target date: 06/12/22  Pt. Will be able to tolerate standing 10 minutes with no increase c/o low back pain to improve household tasks/ ADL.   Baseline:  Limited standing tolerance (>8/10 pain in back/ R upper thigh) Goal status: INITIAL  LONG TERM GOALS: Target date: 07/10/22  Pt. Will increase FOTO to 54 to improve pain-free mobility.   Baseline: initial 45 Goal status: INITIAL  2.  Pt. Will complete Berg balance test and score >48/50 to decrease fall risk/ improve safety with daily activity/ walking.  Baseline: TBD Goal status: INITIAL  3.  Pt. Will report <5/10 low back/ R upper thigh pain while walking.  Baseline: >8/10 pain Goal status: INITIAL  4.  Pt. Able to ambulate with use of SPC and consistent 2-point gait pattern for 10 minutes with no increase c/o back pain.   Baseline: limited endurance/ flexed posture/ antalgic gait.  Goal status: INITIAL   PLAN:  PT FREQUENCY: 2x/week  PT DURATION: 8 weeks  PLANNED INTERVENTIONS: Therapeutic  exercises, Therapeutic activity, Neuromuscular re-education, Balance training, Gait training, Patient/Family education, Self Care, Joint mobilization, Stair training, Aquatic Therapy, Cryotherapy, Moist heat, Manual therapy, and Re-evaluation.  PLAN FOR NEXT SESSION: Reassess posture/ Berg balance test  [name], PT, DPT # [phone] 05/15/2022, 12:44 PM

## 2022-05-21 ENCOUNTER — Ambulatory Visit: Payer: Medicaid Other | Admitting: Physical Therapy

## 2022-05-23 ENCOUNTER — Encounter: Payer: Self-pay | Admitting: Physical Therapy

## 2022-05-23 ENCOUNTER — Ambulatory Visit: Payer: Medicaid Other | Admitting: Physical Therapy

## 2022-05-23 DIAGNOSIS — R269 Unspecified abnormalities of gait and mobility: Secondary | ICD-10-CM

## 2022-05-23 DIAGNOSIS — M5459 Other low back pain: Secondary | ICD-10-CM

## 2022-05-23 DIAGNOSIS — M6281 Muscle weakness (generalized): Secondary | ICD-10-CM

## 2022-05-23 DIAGNOSIS — R293 Abnormal posture: Secondary | ICD-10-CM

## 2022-05-23 NOTE — Therapy (Signed)
OUTPATIENT PHYSICAL THERAPY THORACOLUMBAR TREATMENT  Patient Name: Lindsey Chavez MRN: [id number] DOB:[birth year], 52 y.o., female Today's Date: 05/23/2022  END OF SESSION:  PT End of Session - 05/23/22 0813     Visit Number 2    Number of Visits 16    Date for PT Re-Evaluation 07/10/22    PT Start Time 0813    PT Stop Time 0905    PT Time Calculation (min) 52 min             Past Medical History:  Diagnosis Date   Asthma    Depressive disorder    Past Surgical History:  Procedure Laterality Date   MOUTH SURGERY     TUBAL LIGATION     Patient Active Problem List   Diagnosis Date Noted   Lymphedema 12/19/2017   Pain in both lower extremities 08/19/2017   Bilateral lower extremity edema 08/19/2017    PCP: Emogene Morgan, MD  REFERRING PROVIDER: Morene Crocker, MD  REFERRING DIAG: Other abnormalities of gait and mobility    Chronic low back pain  Rationale for Evaluation and Treatment: Rehabilitation  THERAPY DIAG:  Other low back pain  Abnormal posture  Muscle weakness (generalized)  Gait difficulty  ONSET DATE: Chronic  SUBJECTIVE:                                                                                                                                                                                           SUBJECTIVE STATEMENT: Pt. Reports chronic h/o low back pain due to scoliosis and bulging discs.  Pt. Has not worked since 2022 due to back pain and receives disability.  Pt. Reports no falls and no previous back surgeries.  Pt. Unable to drive due to back pain.  Pt. Takes Lyrica for pain and sleeps in recliner due to increase pain in bed.  Pt. Lives with 3 sons and requires SPC/ use of grab bar for safety.  Injection with Dr. Mariah Milling 6/24  PERTINENT HISTORY:  REFERRING PHYSICIAN: Aycock, Ngwe Achirimofo* PRIMARY CARE PHYSICIAN: Aycock, Ngwe Achirimofor, MD  IMPRESSION/PLAN  [name] is a 52 y.o. female presenting for evaluation  of  NUMBNESS/ TINGLING/ BACK PAIN/ DIFFICULTY SLEEPING/ DEPRESSION  - Unchanged. - Patient with PMH of HTN, lymphedema, lumbar radiculopathy, with ongoing neuropathy pain. Taking Lyrica 25 mg twice daily and 100 mg at night -Labs from last visit showed A1c is in prediabetic range. Alk phos also slightly elevated. Follow up with PCP. -Reviewed note from vascular recommending compression hose, elevating lower extremities, consider lymph follow-up, recommended ultrasound lower extremities. - Provided prescription for 3 point cane -Referral to gait and  balance physical therapy. -Change Lyrica to 25 mg in the morning, 25 mg in the afternoon and 100 mg at night.  -Continue to stay physically active. -Can start vitamin D 1000 units once daily. -Referral for sleep study to evaluate daytime fatigue. -Continue following with vascular. Continue wearing compression hose and elevating legs. -Alpha-lipoic acid is a naturally occurring, biological antioxidant, which might help with many chronic diseases, mostly diabetic peripheral neuropathy but can help other types of neuropathies as well. Usually given 600 mg/day, may take 3-5 wks before some symptomatic relief. Diabetic patients may need to reduce their diabetic meds. It may exacerbate thiamine deficiency in pt's with chronic malnutrition e.g. alcoholism  Follow up in 2-71mo  Medications previously tried:  CHIEF COMPLAINT & HPI  Ms. [name] is a 52 y.o. female presenting for evaluation of: Chief Complaint  Patient presents with  Numbness  Tingling  Back Pain  Sleeping Problem  Depression   NUMBNESS/ TINGLING/ BACK PAIN/ DIFFICULTY SLEEPING/ DEPRESSION  Patient reports with ongoing numbness and tingling in bilateral legs. Notes upcoming [place] LE in April per vascular recommendations. Following visit with vascular specialist, she was recommended to wear compression hose throughout the day and remove them before bed and to continue to elevate bilateral  legs. Notes ongoing swelling in bilateral legs. States she continues to have more frequent numbness, tingling and pain in right leg.Taking Lyrica to 25 mg in the morning, 25 mg in the afternoon and 100 mg at night. He is tolerating this well. Feels this is helpful with symptoms. States continuing to see physiatry for injections in spine. Notes reducing salt intake and getting daily exercise. Notes she spoke with nutritionist and is working on weight loss. Notes ongoing difficulty falling asleep and staying asleep. Reports getting 5 hours of sleep at night. States occasional drowsiness during the day. Notes ongoing appointment with therapist. Reports current mood is stable but ongoing depression. Endorses fatigue and low energy. No recent falls but feels that balance is not doing well. Needs new prescription for cane.  DATA SUMMARY: 12/05/2021 EMG LOWERS  Impression: Abnormal study. 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   05/17/2021 MRI LUMBAR SPINE WO CONTRAST  IMPRESSION:  Lower lumbar degenerative changes as detailed above. Facet  arthropathy has progressed. No new or progressive stenosis.   VISIT SUMMARIES:  MEDICATIONS Outpatient Medications Marked as Taking for the 02/20/22 encounter (Office Visit) with Gelene Mink, PA  Medication Sig  albuterol 90 mcg/actuation inhaler Inhale 2 inhalations into the lungs every 4 (four) hours as needed for Wheezing or Shortness of Breath  ARIPiprazole (ABILIFY) 10 MG tablet Take 10 mg by mouth once daily  busPIRone (BUSPAR) 10 MG tablet Take 10 mg by mouth 3 (three) times daily  diazePAM (VALIUM) 5 MG tablet 1-2 30 minutes before ESI  methocarbamoL (ROBAXIN) 500 MG tablet Take 500 mg by mouth 4 (four) times daily  pregabalin (LYRICA) 25 MG capsule Take 25 mg in the morning 25 mg in the afternoon and 100 mg at night  risperiDONE (RISPERDAL) 1 MG tablet Take 2 mg by mouth  nightly   ALLERGIES Allergies  Allergen Reactions  Pork/Porcine Containing Products Hives   EXAM   Vitals:  02/20/22 0902  BP: 108/77  Pulse: 72  Weight: (!) 118.8 kg (262 lb)  PainSc: 7  PainLoc: Leg   Body mass index is 39.84 kg/m.  GENERAL: Pleasant female, no distress. Normocephalic and atraumatic.  Baseline neurological exam below  was obtained at prior office visit. Changes from today's visit appear in bold.   EYES: PERRLA EOM's intact  MUSCULOSKELETAL: Bulk - Normal Tone - Normal Pronator Drift - Absent bilaterally. Ambulation - Gait and station is slow, antalgic, limping.  Romberg - absent  R/L 5/5 Shoulder abduction (deltoid/supraspinatus, axillary/suprascapular n, C5) 5/5 Elbow flexion (biceps brachii, musculoskeletal n, C5-6) 5/5 Elbow extension (triceps, radial n, C7) 5/5 Finger adduction (interossei, ulnar n, T1)  4/5 Hip flexion (iliopsoas, L1/L2) 4/5 Knee flexion (hamstrings, sciatic n, L5/S1)  4/5 Knee extension (quadriceps, femoral n, L3/4) 5/5 Ankle dorsiflexion (tibialis anterior, deep fibular n, L4/5) 5/5 Ankle plantarflexion (gastroc, tibial n, S1)  Exam limited by pain.   NEUROLOGICAL: MENTAL STATUS: Patient is oriented to person, place and time.  Short-term memory is intact Long-term memory is intact.  Attention span and concentration are intact.  Naming and repetition are intact. Comprehension is intact.  Expressive speech is intact.  Patient's fund of knowledge is within normal limits for educational level.  CRANIAL NERVES: Visual acuity and visual fields are intact  Extraocular muscles are intact  Facial sensation is intact bilaterally  Facial strength is intact bilaterally  Hearing is intact bilaterally  Palate elevates midline, normal phonation  Shoulder shrug strength is intact  Tongue protrudes midline   SENSATION: Pain and temperature (spinothalamic tracts) is normal. Position and vibration (dorsal columns) is  normal.  COORDINATION/CEREBELLAR: Finger to nose testing is intact.  REFLEXES:  Negative Hoffman's sign bilaterally.   PAST MEDICAL HISTORY Past Medical History:  Diagnosis Date  Asthma, unspecified asthma severity, unspecified whether complicated, unspecified whether persistent  Chest pain  Depression  ILD (interstitial lung disease) (CMS-HCC)  Major depressive disorder  Sickle cell trait (CMS-HCC)   PAST SURGICAL HISTORY Past Surgical History:  Procedure Laterality Date  dental surgery  LAPAROSCOPIC TUBAL LIGATION   PAIN:  Are you having pain? Yes: NPRS scale: 8/10 Pain location: R upper thigh Pain description: persistent Aggravating factors: standing/ walking Relieving factors: recliner/ medications  PRECAUTIONS: None  WEIGHT BEARING RESTRICTIONS: No  FALLS:  Has patient fallen in last 6 months? No  LIVING ENVIRONMENT: Lives with: lives with their family and lives with an adult companion Lives in: House/apartment Stairs: No Has following equipment at home: Single point cane, Tour manager, and Grab bars  OCCUPATION: Disability  PLOF: Independent  PATIENT GOALS: Improve walking/ safety.  Decrease low back pain.    NEXT MD VISIT: 06/08/22  OBJECTIVE:   DIAGNOSTIC FINDINGS:  CLINICAL DATA:  Chronic low back pain, unspecified laterality; right lumbar radiculopathy; spinal stenosis of lumbosacral region   EXAM: MRI LUMBAR SPINE WITHOUT CONTRAST   TECHNIQUE: Multiplanar, multisequence MR imaging of the lumbar spine was performed. No intravenous contrast was administered.   COMPARISON:  03/04/2018   FINDINGS: Segmentation:  Standard.   Alignment: Stable. Mild levocurvature. Trace anterolisthesis at L4-L5.   Vertebrae: Vertebral body heights are maintained. Mild degenerative marrow edema at L5-S1 eccentric to the left.   Conus medullaris and cauda equina: Conus extends to the L2-L3 level. Conus and cauda equina appear normal.   Paraspinal and  other soft tissues: Unremarkable.   Disc levels:   L1-L2:  No canal or foraminal stenosis.   L2-L3:  No canal or foraminal stenosis.   L3-L4:  Minor facet arthropathy.  No canal or foraminal stenosis.   L4-L5: Disc desiccation. Anterolisthesis with uncovering of disc. Right foraminal protrusion. Moderate facet arthropathy with ligamentum flavum infolding. No canal or left foraminal stenosis. Minor right foraminal  stenosis. Disc is in proximity to but does not definitely compress the exiting L4 nerve root.   L5-S1: Disc desiccation. Mild right and moderate left facet arthropathy. No canal or foraminal stenosis.   IMPRESSION: Lower lumbar degenerative changes as detailed above. Facet arthropathy has progressed. No new or progressive stenosis.     Electronically Signed   By: Guadlupe Spanish M.D.   On: 05/17/2021 13:59  PATIENT SURVEYS:  FOTO initial 45/ goal 54  SCREENING FOR RED FLAGS: Bowel or bladder incontinence: No Spinal tumors: No Cauda equina syndrome: No Compression fracture: No Abdominal aneurysm: No  COGNITION: Overall cognitive status: Within functional limits for tasks assessed     SENSATION: WFL  MUSCLE LENGTH: Hamstrings: Right 60 deg; Left 60 deg  (severe pain reported) Maisie Fus test: NT  (pain getting into position)  POSTURE: rounded shoulders, forward head, and flexed trunk   PALPATION: Generalized lumbar hypomobility/ tenderness with palpation.  LUMBAR ROM:   Stands in flexed posture (25 deg.)- pain limited with correction.  Limited/ poor standing tolerance and required 2 seated rest breaks during lumbar testing.   AROM eval  Flexion 25% limited   Extension 100% limited (pain)  Right lateral flexion 50% limited (pain)  Left lateral flexion 50% limited (pain)  Right rotation 50% limited (pain)  Left rotation 50% limited (pain)   (Blank rows = not tested)  LOWER EXTREMITY ROM:      B LE AROM WFL except hip extension (pain)  LOWER  EXTREMITY MMT:    MMT Right eval Left eval  Hip flexion 3+ (pain) 3+  Hip extension Unable to test Unable to test  Hip abduction 3 3+  Hip adduction 4 4  Hip internal rotation 4 4  Hip external rotation 4 4  Knee flexion 4 (LBP) 4  Knee extension 4 (LBP) 4  Ankle dorsiflexion    Ankle plantarflexion    Ankle inversion    Ankle eversion     (Blank rows = not tested)  LUMBAR SPECIAL TESTS:  Straight leg raise test: Positive and FABER test: Positive  FUNCTIONAL TESTS:  5 times sit to stand: TBD Berg Balance Scale: TBD  GAIT: Distance walked: in clinic Assistive device utilized: Single point cane  PT discussed use of rollator/ demonstrates Level of assistance: Modified independence Comments: Marked antalgic gait with limited hip/knee flexion and heavy B UE assist with SPC or rollator.  Poor endurance.  R LE muscle weakness/ fatigue reported.   TODAY'S TREATMENT:                                                                                                                              DATE: 05/23/2022  Subjective:  Pt. Reports 8/10 low back pain this morning walking into PT clinic.  Pt. Entered PT with use of SPC and marked antalgic gait pattern/ forward flexed posture.  Pt. Went to see a movie on Mother's Day and had difficulty walking into theater and sitting  for prolonged period.  Pt. Had to leave movie early due to increase back discomfort.    There.ex.:  Nustep L1 9 min. B UE/LE (MH to low back)- discussed weekend activities.  Marked fatigue.    Seated marching/ LAQ 20x.    Reviewed HEP  Neuro. Mm.:  Walking in //-bars forward with cuing for upright posture/ step pattern and mirror feedback.  3 laps.  Berg: unable to complete test secondary to "throbbing R low back pain" and legs "feel like they are going to buckle".  Pt. Fearful of falling.  Pt. Requires UE assist to stand from chair but once she attempted standing unsupported for 2 minutes she c/o increase back  pain.    Ambulate in clinic/ hallway with use of rollator working on consistent step pattern/ heel strike.  125 feet with a seated rest break.  Pt. Fatigues/ c/o increase risk of B knee buckling with standing/walking tasks.  MH to low back at end of tx. In sitting while waiting for transportation.      PATIENT EDUCATION:  Education details: Use of rollator/ upright posture Person educated: Patient Education method: Medical illustrator Education comprehension: verbalized understanding, returned demonstration, and needs further education  HOME EXERCISE PROGRAM: TBD  ASSESSMENT:  CLINICAL IMPRESSION: Pt. limited with standing posture and prefers flexed upright posture.  Several seated rest breaks during tx. Session due to back pain/ LE muscle fatigue.  Pt. currently uses SPC with gait but benefits from use of rollator for safety.  Moderate B LE muscle weakness with marked increase in R LE pain/ fatigue.  Unable to complete Berg balance test due pain.  Pt. Will benefit from skilled PT services to develop HEP to increase LE muscle strength/ upright posture to improve walking/ functional tasks.   OBJECTIVE IMPAIRMENTS: Abnormal gait, decreased activity tolerance, decreased balance, decreased coordination, decreased endurance, decreased knowledge of use of DME, decreased mobility, difficulty walking, decreased ROM, decreased strength, decreased safety awareness, hypomobility, impaired flexibility, improper body mechanics, postural dysfunction, and pain.   ACTIVITY LIMITATIONS: carrying, lifting, bending, sitting, standing, squatting, sleeping, stairs, transfers, bed mobility, bathing, toileting, dressing, and locomotion level  PARTICIPATION LIMITATIONS: meal prep, cleaning, laundry, driving, shopping, and community activity  PERSONAL FACTORS: Fitness and Past/current experiences are also affecting patient's functional outcome.   REHAB POTENTIAL: Good  CLINICAL DECISION MAKING:  Unstable/unpredictable  EVALUATION COMPLEXITY: High   GOALS: Goals reviewed with patient? Yes  SHORT TERM GOALS: Target date: 06/12/22  Pt. Will be able to tolerate standing 10 minutes with no increase c/o low back pain to improve household tasks/ ADL.   Baseline:  Limited standing tolerance (>8/10 pain in back/ R upper thigh) Goal status: INITIAL  LONG TERM GOALS: Target date: 07/10/22  Pt. Will increase FOTO to 54 to improve pain-free mobility.   Baseline: initial 45 Goal status: INITIAL  2.  Pt. Will complete Berg balance test and score >48/50 to decrease fall risk/ improve safety with daily activity/ walking.  Baseline: TBD Goal status: INITIAL  3.  Pt. Will report <5/10 low back/ R upper thigh pain while walking.  Baseline: >8/10 pain Goal status: INITIAL  4.  Pt. Able to ambulate with use of SPC and consistent 2-point gait pattern for 10 minutes with no increase c/o back pain.   Baseline: limited endurance/ flexed posture/ antalgic gait.  Goal status: INITIAL  PLAN:  PT FREQUENCY: 2x/week  PT DURATION: 8 weeks  PLANNED INTERVENTIONS: Therapeutic exercises, Therapeutic activity, Neuromuscular re-education, Balance training, Gait training, Patient/Family education,  Self Care, Joint mobilization, Stair training, Aquatic Therapy, Cryotherapy, Moist heat, Manual therapy, and Re-evaluation.  PLAN FOR NEXT SESSION: Issue HEP  [name], PT, DPT # [phone] 05/23/2022, 1:09 PM

## 2022-05-28 ENCOUNTER — Ambulatory Visit: Payer: Medicaid Other | Admitting: Physical Therapy

## 2022-05-28 DIAGNOSIS — R293 Abnormal posture: Secondary | ICD-10-CM

## 2022-05-28 DIAGNOSIS — M5459 Other low back pain: Secondary | ICD-10-CM | POA: Diagnosis not present

## 2022-05-28 DIAGNOSIS — M6281 Muscle weakness (generalized): Secondary | ICD-10-CM

## 2022-05-28 DIAGNOSIS — R269 Unspecified abnormalities of gait and mobility: Secondary | ICD-10-CM

## 2022-05-28 NOTE — Therapy (Signed)
OUTPATIENT PHYSICAL THERAPY THORACOLUMBAR TREATMENT  Patient Name: Lindsey Chavez MRN: [id number] DOB:[birth year], 52 y.o., female Today's Date: 05/28/2022  END OF SESSION:  PT End of Session - 05/28/22 0756     Visit Number 3    Number of Visits 16    Date for PT Re-Evaluation 07/10/22    PT Start Time 0756    PT Stop Time 0900    PT Time Calculation (min) 64 min             Past Medical History:  Diagnosis Date   Asthma    Depressive disorder    Past Surgical History:  Procedure Laterality Date   MOUTH SURGERY     TUBAL LIGATION     Patient Active Problem List   Diagnosis Date Noted   Lymphedema 12/19/2017   Pain in both lower extremities 08/19/2017   Bilateral lower extremity edema 08/19/2017    PCP: Emogene Morgan, MD  REFERRING PROVIDER: Morene Crocker, MD  REFERRING DIAG: Other abnormalities of gait and mobility    Chronic low back pain  Rationale for Evaluation and Treatment: Rehabilitation  THERAPY DIAG:  Other low back pain  Abnormal posture  Muscle weakness (generalized)  Gait difficulty  ONSET DATE: Chronic  SUBJECTIVE:                                                                                                                                                                                           SUBJECTIVE STATEMENT: Pt. Reports chronic h/o low back pain due to scoliosis and bulging discs.  Pt. Has not worked since 2022 due to back pain and receives disability.  Pt. Reports no falls and no previous back surgeries.  Pt. Unable to drive due to back pain.  Pt. Takes Lyrica for pain and sleeps in recliner due to increase pain in bed.  Pt. Lives with 3 sons and requires SPC/ use of grab bar for safety.  Injection with Dr. Mariah Milling 6/24  PERTINENT HISTORY:  REFERRING PHYSICIAN: Aycock, Ngwe Achirimofo* PRIMARY CARE PHYSICIAN: Aycock, Ngwe Achirimofor, MD  IMPRESSION/PLAN  [name] is a 52 y.o. female presenting for evaluation  of  NUMBNESS/ TINGLING/ BACK PAIN/ DIFFICULTY SLEEPING/ DEPRESSION  - Unchanged. - Patient with PMH of HTN, lymphedema, lumbar radiculopathy, with ongoing neuropathy pain. Taking Lyrica 25 mg twice daily and 100 mg at night -Labs from last visit showed A1c is in prediabetic range. Alk phos also slightly elevated. Follow up with PCP. -Reviewed note from vascular recommending compression hose, elevating lower extremities, consider lymph follow-up, recommended ultrasound lower extremities. - Provided prescription for 3 point cane -Referral to gait and  balance physical therapy. -Change Lyrica to 25 mg in the morning, 25 mg in the afternoon and 100 mg at night.  -Continue to stay physically active. -Can start vitamin D 1000 units once daily. -Referral for sleep study to evaluate daytime fatigue. -Continue following with vascular. Continue wearing compression hose and elevating legs. -Alpha-lipoic acid is a naturally occurring, biological antioxidant, which might help with many chronic diseases, mostly diabetic peripheral neuropathy but can help other types of neuropathies as well. Usually given 600 mg/day, may take 3-5 wks before some symptomatic relief. Diabetic patients may need to reduce their diabetic meds. It may exacerbate thiamine deficiency in pt's with chronic malnutrition e.g. alcoholism  Follow up in 2-43mo  Medications previously tried:  CHIEF COMPLAINT & HPI  [name] is a 52 y.o. female presenting for evaluation of: Chief Complaint  Patient presents with  Numbness  Tingling  Back Pain  Sleeping Problem  Depression   NUMBNESS/ TINGLING/ BACK PAIN/ DIFFICULTY SLEEPING/ DEPRESSION  Patient reports with ongoing numbness and tingling in bilateral legs. Notes upcoming [place] LE in April per vascular recommendations. Following visit with vascular specialist, she was recommended to wear compression hose throughout the day and remove them before bed and to continue to elevate bilateral  legs. Notes ongoing swelling in bilateral legs. States she continues to have more frequent numbness, tingling and pain in right leg.Taking Lyrica to 25 mg in the morning, 25 mg in the afternoon and 100 mg at night. He is tolerating this well. Feels this is helpful with symptoms. States continuing to see physiatry for injections in spine. Notes reducing salt intake and getting daily exercise. Notes she spoke with nutritionist and is working on weight loss. Notes ongoing difficulty falling asleep and staying asleep. Reports getting 5 hours of sleep at night. States occasional drowsiness during the day. Notes ongoing appointment with therapist. Reports current mood is stable but ongoing depression. Endorses fatigue and low energy. No recent falls but feels that balance is not doing well. Needs new prescription for cane.  DATA SUMMARY: 12/05/2021 EMG LOWERS  Impression: Abnormal study. 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   05/17/2021 MRI LUMBAR SPINE WO CONTRAST  IMPRESSION:  Lower lumbar degenerative changes as detailed above. Facet  arthropathy has progressed. No new or progressive stenosis.   VISIT SUMMARIES:  MEDICATIONS Outpatient Medications Marked as Taking for the 02/20/22 encounter (Office Visit) with Gelene Mink, PA  Medication Sig  albuterol 90 mcg/actuation inhaler Inhale 2 inhalations into the lungs every 4 (four) hours as needed for Wheezing or Shortness of Breath  ARIPiprazole (ABILIFY) 10 MG tablet Take 10 mg by mouth once daily  busPIRone (BUSPAR) 10 MG tablet Take 10 mg by mouth 3 (three) times daily  diazePAM (VALIUM) 5 MG tablet 1-2 30 minutes before ESI  methocarbamoL (ROBAXIN) 500 MG tablet Take 500 mg by mouth 4 (four) times daily  pregabalin (LYRICA) 25 MG capsule Take 25 mg in the morning 25 mg in the afternoon and 100 mg at night  risperiDONE (RISPERDAL) 1 MG tablet Take 2 mg by mouth  nightly   ALLERGIES Allergies  Allergen Reactions  Pork/Porcine Containing Products Hives   EXAM   Vitals:  02/20/22 0902  BP: 108/77  Pulse: 72  Weight: (!) 118.8 kg (262 lb)  PainSc: 7  PainLoc: Leg   Body mass index is 39.84 kg/m.  GENERAL: Pleasant female, no distress. Normocephalic and atraumatic.  Baseline neurological exam below  was obtained at prior office visit. Changes from today's visit appear in bold.   EYES: PERRLA EOM's intact  MUSCULOSKELETAL: Bulk - Normal Tone - Normal Pronator Drift - Absent bilaterally. Ambulation - Gait and station is slow, antalgic, limping.  Romberg - absent  R/L 5/5 Shoulder abduction (deltoid/supraspinatus, axillary/suprascapular n, C5) 5/5 Elbow flexion (biceps brachii, musculoskeletal n, C5-6) 5/5 Elbow extension (triceps, radial n, C7) 5/5 Finger adduction (interossei, ulnar n, T1)  4/5 Hip flexion (iliopsoas, L1/L2) 4/5 Knee flexion (hamstrings, sciatic n, L5/S1)  4/5 Knee extension (quadriceps, femoral n, L3/4) 5/5 Ankle dorsiflexion (tibialis anterior, deep fibular n, L4/5) 5/5 Ankle plantarflexion (gastroc, tibial n, S1)  Exam limited by pain.   NEUROLOGICAL: MENTAL STATUS: Patient is oriented to person, place and time.  Short-term memory is intact Long-term memory is intact.  Attention span and concentration are intact.  Naming and repetition are intact. Comprehension is intact.  Expressive speech is intact.  Patient's fund of knowledge is within normal limits for educational level.  CRANIAL NERVES: Visual acuity and visual fields are intact  Extraocular muscles are intact  Facial sensation is intact bilaterally  Facial strength is intact bilaterally  Hearing is intact bilaterally  Palate elevates midline, normal phonation  Shoulder shrug strength is intact  Tongue protrudes midline   SENSATION: Pain and temperature (spinothalamic tracts) is normal. Position and vibration (dorsal columns) is  normal.  COORDINATION/CEREBELLAR: Finger to nose testing is intact.  REFLEXES:  Negative Hoffman's sign bilaterally.   PAST MEDICAL HISTORY Past Medical History:  Diagnosis Date  Asthma, unspecified asthma severity, unspecified whether complicated, unspecified whether persistent  Chest pain  Depression  ILD (interstitial lung disease) (CMS-HCC)  Major depressive disorder  Sickle cell trait (CMS-HCC)   PAST SURGICAL HISTORY Past Surgical History:  Procedure Laterality Date  dental surgery  LAPAROSCOPIC TUBAL LIGATION   PAIN:  Are you having pain? Yes: NPRS scale: 8/10 Pain location: R upper thigh Pain description: persistent Aggravating factors: standing/ walking Relieving factors: recliner/ medications  PRECAUTIONS: None  WEIGHT BEARING RESTRICTIONS: No  FALLS:  Has patient fallen in last 6 months? No  LIVING ENVIRONMENT: Lives with: lives with their family and lives with an adult companion Lives in: House/apartment Stairs: No Has following equipment at home: Single point cane, Tour manager, and Grab bars  OCCUPATION: Disability  PLOF: Independent  PATIENT GOALS: Improve walking/ safety.  Decrease low back pain.    NEXT MD VISIT: 06/08/22  OBJECTIVE:   DIAGNOSTIC FINDINGS:  CLINICAL DATA:  Chronic low back pain, unspecified laterality; right lumbar radiculopathy; spinal stenosis of lumbosacral region   EXAM: MRI LUMBAR SPINE WITHOUT CONTRAST   TECHNIQUE: Multiplanar, multisequence MR imaging of the lumbar spine was performed. No intravenous contrast was administered.   COMPARISON:  03/04/2018   FINDINGS: Segmentation:  Standard.   Alignment: Stable. Mild levocurvature. Trace anterolisthesis at L4-L5.   Vertebrae: Vertebral body heights are maintained. Mild degenerative marrow edema at L5-S1 eccentric to the left.   Conus medullaris and cauda equina: Conus extends to the L2-L3 level. Conus and cauda equina appear normal.   Paraspinal and  other soft tissues: Unremarkable.   Disc levels:   L1-L2:  No canal or foraminal stenosis.   L2-L3:  No canal or foraminal stenosis.   L3-L4:  Minor facet arthropathy.  No canal or foraminal stenosis.   L4-L5: Disc desiccation. Anterolisthesis with uncovering of disc. Right foraminal protrusion. Moderate facet arthropathy with ligamentum flavum infolding. No canal or left foraminal stenosis. Minor right foraminal  stenosis. Disc is in proximity to but does not definitely compress the exiting L4 nerve root.   L5-S1: Disc desiccation. Mild right and moderate left facet arthropathy. No canal or foraminal stenosis.   IMPRESSION: Lower lumbar degenerative changes as detailed above. Facet arthropathy has progressed. No new or progressive stenosis.     Electronically Signed   By: Guadlupe Spanish M.D.   On: 05/17/2021 13:59  PATIENT SURVEYS:  FOTO initial 45/ goal 54  SCREENING FOR RED FLAGS: Bowel or bladder incontinence: No Spinal tumors: No Cauda equina syndrome: No Compression fracture: No Abdominal aneurysm: No  COGNITION: Overall cognitive status: Within functional limits for tasks assessed     SENSATION: WFL  MUSCLE LENGTH: Hamstrings: Right 60 deg; Left 60 deg  (severe pain reported) Maisie Fus test: NT  (pain getting into position)  POSTURE: rounded shoulders, forward head, and flexed trunk   PALPATION: Generalized lumbar hypomobility/ tenderness with palpation.  LUMBAR ROM:   Stands in flexed posture (25 deg.)- pain limited with correction.  Limited/ poor standing tolerance and required 2 seated rest breaks during lumbar testing.   AROM eval  Flexion 25% limited   Extension 100% limited (pain)  Right lateral flexion 50% limited (pain)  Left lateral flexion 50% limited (pain)  Right rotation 50% limited (pain)  Left rotation 50% limited (pain)   (Blank rows = not tested)  LOWER EXTREMITY ROM:      B LE AROM WFL except hip extension (pain)  LOWER  EXTREMITY MMT:    MMT Right eval Left eval  Hip flexion 3+ (pain) 3+  Hip extension Unable to test Unable to test  Hip abduction 3 3+  Hip adduction 4 4  Hip internal rotation 4 4  Hip external rotation 4 4  Knee flexion 4 (LBP) 4  Knee extension 4 (LBP) 4  Ankle dorsiflexion    Ankle plantarflexion    Ankle inversion    Ankle eversion     (Blank rows = not tested)  LUMBAR SPECIAL TESTS:  Straight leg raise test: Positive and FABER test: Positive  FUNCTIONAL TESTS:  5 times sit to stand: TBD Berg Balance Scale: TBD  GAIT: Distance walked: in clinic Assistive device utilized: Single point cane  PT discussed use of rollator/ demonstrates Level of assistance: Modified independence Comments: Marked antalgic gait with limited hip/knee flexion and heavy B UE assist with SPC or rollator.  Poor endurance.  R LE muscle weakness/ fatigue reported.   TODAY'S TREATMENT:                                                                                                                              DATE: 05/28/2022  Subjective:  Pt. Reports 6/10 low back pain this morning walking into PT clinic.  Pt. Entered PT with use of SPC and marked antalgic gait pattern/ forward flexed posture.  Pt. States she is returning to PCP this Thursday and will ask about order for rollator to  improve safety with walking.    There.ex.:  Nustep L1 11 min. B UE/LE - discussed weekend activities.  Marked fatigue/ slow cadence.  Cuing to increase L/R knee extension and flexion.    Seated marching/ LAQ 20x.  Discussed R UT/posterior shoulder tightness from using heavy UE assist with SPC/ rollator/ //-bars.    See HEP (issued handouts)- good understanding of sets/reps.  Neuro. Mm.:  Walking in //-bars forward/ backwards with cuing for upright posture/ step pattern and mirror feedback.  3 laps.  Heavy UE assist in //-bars required.  Lateral walking in //-bars 1 lap (increase low back pain/ difficulty maintaining  foot in midline).    Ambulate in clinic/ hallway with use of rollator working on consistent step pattern/ heel strike.  95 feet with a seated rest break.  Pt. Fatigues/ c/o increase risk of B knee buckling with standing/walking tasks.  Pt. Benefits from use of rollator as compared to [facility] with walking on all surfaces.  No episodes of knee buckling during tx. (Use of gait belt for safety).      PATIENT EDUCATION:  Education details: Use of rollator/ upright posture Person educated: Patient Education method: Medical illustrator Education comprehension: verbalized understanding, returned demonstration, and needs further education  HOME EXERCISE PROGRAM: Access Code: [email or web address] URL: [email or web address] Date: 05/28/2022 Prepared by: Dorene Grebe  Exercises - Seated Long Arc Quad  - 2 x daily - 7 x weekly - 1 sets - 20 reps - Seated March  - 2 x daily - 7 x weekly - 1 sets - 20 reps - Seated Heel Toe Raises  - 2 x daily - 7 x weekly - 1 sets - 20 reps - Side Stepping with Counter Support  - 2 x daily - 7 x weekly - 1 sets - 20 reps - Sit to Stand with Counter Support  - 2 x daily - 7 x weekly - 1 sets - 10 reps  ASSESSMENT:  CLINICAL IMPRESSION: Pt. limited with standing posture and prefers flexed upright posture due to low back pain.  Several seated rest breaks during tx. Session due to back pain/ LE muscle fatigue.  Pt. currently uses SPC with gait but benefits from use of rollator for safety.  PT sent MD order for rollator to assist pt. With getting rollator after MD appt. This Thursday.  Moderate B LE muscle weakness with marked increase in R LE pain/ fatigue.  Pt. Will benefit from skilled PT services to develop HEP to increase LE muscle strength/ upright posture to improve walking/ functional tasks.   OBJECTIVE IMPAIRMENTS: Abnormal gait, decreased activity tolerance, decreased balance, decreased coordination, decreased endurance, decreased knowledge of use of  DME, decreased mobility, difficulty walking, decreased ROM, decreased strength, decreased safety awareness, hypomobility, impaired flexibility, improper body mechanics, postural dysfunction, and pain.   ACTIVITY LIMITATIONS: carrying, lifting, bending, sitting, standing, squatting, sleeping, stairs, transfers, bed mobility, bathing, toileting, dressing, and locomotion level  PARTICIPATION LIMITATIONS: meal prep, cleaning, laundry, driving, shopping, and community activity  PERSONAL FACTORS: Fitness and Past/current experiences are also affecting patient's functional outcome.   REHAB POTENTIAL: Good  CLINICAL DECISION MAKING: Unstable/unpredictable  EVALUATION COMPLEXITY: High   GOALS: Goals reviewed with patient? Yes  SHORT TERM GOALS: Target date: 06/12/22  Pt. Will be able to tolerate standing 10 minutes with no increase c/o low back pain to improve household tasks/ ADL.   Baseline:  Limited standing tolerance (>8/10 pain in back/ R upper thigh) Goal status: INITIAL  LONG TERM GOALS: Target date:  07/10/22  Pt. Will increase FOTO to 54 to improve pain-free mobility.   Baseline: initial 45 Goal status: INITIAL  2.  Pt. Will complete Berg balance test and score >48/50 to decrease fall risk/ improve safety with daily activity/ walking.  Baseline: TBD Goal status: INITIAL  3.  Pt. Will report <5/10 low back/ R upper thigh pain while walking.  Baseline: >8/10 pain Goal status: INITIAL  4.  Pt. Able to ambulate with use of SPC and consistent 2-point gait pattern for 10 minutes with no increase c/o back pain.   Baseline: limited endurance/ flexed posture/ antalgic gait.  Goal status: INITIAL  PLAN:  PT FREQUENCY: 2x/week  PT DURATION: 8 weeks  PLANNED INTERVENTIONS: Therapeutic exercises, Therapeutic activity, Neuromuscular re-education, Balance training, Gait training, Patient/Family education, Self Care, Joint mobilization, Stair training, Aquatic Therapy, Cryotherapy, Moist  heat, Manual therapy, and Re-evaluation.  PLAN FOR NEXT SESSION: Supine lumbar/LE ROM and stretches.  Pt. Unable to tolerate prone  [name], PT, DPT # [phone] 05/28/2022, 8:26 PM

## 2022-05-30 ENCOUNTER — Ambulatory Visit: Payer: Medicaid Other

## 2022-05-30 ENCOUNTER — Encounter: Payer: Self-pay | Admitting: Physical Therapy

## 2022-05-30 DIAGNOSIS — M5459 Other low back pain: Secondary | ICD-10-CM | POA: Diagnosis not present

## 2022-05-30 DIAGNOSIS — M6281 Muscle weakness (generalized): Secondary | ICD-10-CM

## 2022-05-30 DIAGNOSIS — R293 Abnormal posture: Secondary | ICD-10-CM

## 2022-05-30 DIAGNOSIS — R269 Unspecified abnormalities of gait and mobility: Secondary | ICD-10-CM

## 2022-05-30 NOTE — Therapy (Signed)
OUTPATIENT PHYSICAL THERAPY THORACOLUMBAR TREATMENT  Patient Name: Lindsey Chavez MRN: [id number] DOB:Apr 15, 1970, 52 y.o., female Today's Date: 05/30/2022  END OF SESSION:  PT End of Session - 05/30/22 0813     Visit Number 4    Number of Visits 16    Date for PT Re-Evaluation 07/10/22    PT Start Time 0815    PT Stop Time 0900    PT Time Calculation (min) 45 min    Activity Tolerance Patient tolerated treatment well    Behavior During Therapy [facility] for tasks assessed/performed             Past Medical History:  Diagnosis Date   Asthma    Depressive disorder    Past Surgical History:  Procedure Laterality Date   MOUTH SURGERY     TUBAL LIGATION     Patient Active Problem List   Diagnosis Date Noted   Lymphedema 12/19/2017   Pain in both lower extremities 08/19/2017   Bilateral lower extremity edema 08/19/2017    PCP: Emogene Morgan, MD  REFERRING PROVIDER: Morene Crocker, MD  REFERRING DIAG: Other abnormalities of gait and mobility    Chronic low back pain  Rationale for Evaluation and Treatment: Rehabilitation  THERAPY DIAG:  Other low back pain  Abnormal posture  Muscle weakness (generalized)  Gait difficulty  ONSET DATE: Chronic  SUBJECTIVE:                                                                                                                                                                                           SUBJECTIVE STATEMENT: Pt. Reports chronic h/o low back pain due to scoliosis and bulging discs.  Pt. Has not worked since 2022 due to back pain and receives disability.  Pt. Reports no falls and no previous back surgeries.  Pt. Unable to drive due to back pain.  Pt. Takes Lyrica for pain and sleeps in recliner due to increase pain in bed.  Pt. Lives with 3 sons and requires SPC/ use of grab bar for safety.  Injection with Dr. Mariah Milling 6/24  PERTINENT HISTORY:  REFERRING PHYSICIAN: Aycock, Ngwe Achirimofo* PRIMARY CARE  PHYSICIAN: Aycock, Ngwe Achirimofor, MD  IMPRESSION/PLAN  Ms. Dickenson is a 52 y.o. female presenting for evaluation of  NUMBNESS/ TINGLING/ BACK PAIN/ DIFFICULTY SLEEPING/ DEPRESSION  - Unchanged. - Patient with PMH of HTN, lymphedema, lumbar radiculopathy, with ongoing neuropathy pain. Taking Lyrica 25 mg twice daily and 100 mg at night -Labs from last visit showed A1c is in prediabetic range. Alk phos also slightly elevated. Follow up with PCP. -Reviewed note from vascular recommending compression hose, elevating lower  extremities, consider lymph follow-up, recommended ultrasound lower extremities. - Provided prescription for 3 point cane -Referral to gait and balance physical therapy. -Change Lyrica to 25 mg in the morning, 25 mg in the afternoon and 100 mg at night.  -Continue to stay physically active. -Can start vitamin D 1000 units once daily. -Referral for sleep study to evaluate daytime fatigue. -Continue following with vascular. Continue wearing compression hose and elevating legs. -Alpha-lipoic acid is a naturally occurring, biological antioxidant, which might help with many chronic diseases, mostly diabetic peripheral neuropathy but can help other types of neuropathies as well. Usually given 600 mg/day, may take 3-5 wks before some symptomatic relief. Diabetic patients may need to reduce their diabetic meds. It may exacerbate thiamine deficiency in pt's with chronic malnutrition e.g. alcoholism  Follow up in 2-76mo  Medications previously tried:  CHIEF COMPLAINT & HPI  Ms. Popick is a 52 y.o. female presenting for evaluation of: Chief Complaint  Patient presents with  Numbness  Tingling  Back Pain  Sleeping Problem  Depression   NUMBNESS/ TINGLING/ BACK PAIN/ DIFFICULTY SLEEPING/ DEPRESSION  Patient reports with ongoing numbness and tingling in bilateral legs. Notes upcoming [place] LE in April per vascular recommendations. Following visit with vascular specialist, she was  recommended to wear compression hose throughout the day and remove them before bed and to continue to elevate bilateral legs. Notes ongoing swelling in bilateral legs. States she continues to have more frequent numbness, tingling and pain in right leg.Taking Lyrica to 25 mg in the morning, 25 mg in the afternoon and 100 mg at night. He is tolerating this well. Feels this is helpful with symptoms. States continuing to see physiatry for injections in spine. Notes reducing salt intake and getting daily exercise. Notes she spoke with nutritionist and is working on weight loss. Notes ongoing difficulty falling asleep and staying asleep. Reports getting 5 hours of sleep at night. States occasional drowsiness during the day. Notes ongoing appointment with therapist. Reports current mood is stable but ongoing depression. Endorses fatigue and low energy. No recent falls but feels that balance is not doing well. Needs new prescription for cane.  DATA SUMMARY: 12/05/2021 EMG LOWERS  Impression: Abnormal study. 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   05/17/2021 MRI LUMBAR SPINE WO CONTRAST  IMPRESSION:  Lower lumbar degenerative changes as detailed above. Facet  arthropathy has progressed. No new or progressive stenosis.   VISIT SUMMARIES:  MEDICATIONS Outpatient Medications Marked as Taking for the 02/20/22 encounter (Office Visit) with Gelene Mink, PA  Medication Sig  albuterol 90 mcg/actuation inhaler Inhale 2 inhalations into the lungs every 4 (four) hours as needed for Wheezing or Shortness of Breath  ARIPiprazole (ABILIFY) 10 MG tablet Take 10 mg by mouth once daily  busPIRone (BUSPAR) 10 MG tablet Take 10 mg by mouth 3 (three) times daily  diazePAM (VALIUM) 5 MG tablet 1-2 30 minutes before ESI  methocarbamoL (ROBAXIN) 500 MG tablet Take 500 mg by mouth 4 (four) times daily  pregabalin (LYRICA) 25 MG capsule Take 25  mg in the morning 25 mg in the afternoon and 100 mg at night  risperiDONE (RISPERDAL) 1 MG tablet Take 2 mg by mouth nightly   ALLERGIES Allergies  Allergen Reactions  Pork/Porcine Containing Products Hives   EXAM   Vitals:  02/20/22 0902  BP: 108/77  Pulse: 72  Weight: (!) 118.8 kg (262 lb)  PainSc: 7  PainLoc: Leg   Body  mass index is 39.84 kg/m.  GENERAL: Pleasant female, no distress. Normocephalic and atraumatic.  Baseline neurological exam below was obtained at prior office visit. Changes from today's visit appear in bold.   EYES: PERRLA EOM's intact  MUSCULOSKELETAL: Bulk - Normal Tone - Normal Pronator Drift - Absent bilaterally. Ambulation - Gait and station is slow, antalgic, limping.  Romberg - absent  R/L 5/5 Shoulder abduction (deltoid/supraspinatus, axillary/suprascapular n, C5) 5/5 Elbow flexion (biceps brachii, musculoskeletal n, C5-6) 5/5 Elbow extension (triceps, radial n, C7) 5/5 Finger adduction (interossei, ulnar n, T1)  4/5 Hip flexion (iliopsoas, L1/L2) 4/5 Knee flexion (hamstrings, sciatic n, L5/S1)  4/5 Knee extension (quadriceps, femoral n, L3/4) 5/5 Ankle dorsiflexion (tibialis anterior, deep fibular n, L4/5) 5/5 Ankle plantarflexion (gastroc, tibial n, S1)  Exam limited by pain.   NEUROLOGICAL: MENTAL STATUS: Patient is oriented to person, place and time.  Short-term memory is intact Long-term memory is intact.  Attention span and concentration are intact.  Naming and repetition are intact. Comprehension is intact.  Expressive speech is intact.  Patient's fund of knowledge is within normal limits for educational level.  CRANIAL NERVES: Visual acuity and visual fields are intact  Extraocular muscles are intact  Facial sensation is intact bilaterally  Facial strength is intact bilaterally  Hearing is intact bilaterally  Palate elevates midline, normal phonation  Shoulder shrug strength is intact  Tongue protrudes midline    SENSATION: Pain and temperature (spinothalamic tracts) is normal. Position and vibration (dorsal columns) is normal.  COORDINATION/CEREBELLAR: Finger to nose testing is intact.  REFLEXES:  Negative Hoffman's sign bilaterally.   PAST MEDICAL HISTORY Past Medical History:  Diagnosis Date  Asthma, unspecified asthma severity, unspecified whether complicated, unspecified whether persistent  Chest pain  Depression  ILD (interstitial lung disease) (CMS-HCC)  Major depressive disorder  Sickle cell trait (CMS-HCC)   PAST SURGICAL HISTORY Past Surgical History:  Procedure Laterality Date  dental surgery  LAPAROSCOPIC TUBAL LIGATION   PAIN:  Are you having pain? Yes: NPRS scale: 8/10 Pain location: R upper thigh Pain description: persistent Aggravating factors: standing/ walking Relieving factors: recliner/ medications  PRECAUTIONS: None  WEIGHT BEARING RESTRICTIONS: No  FALLS:  Has patient fallen in last 6 months? No  LIVING ENVIRONMENT: Lives with: lives with their family and lives with an adult companion Lives in: House/apartment Stairs: No Has following equipment at home: Single point cane, Tour manager, and Grab bars  OCCUPATION: Disability  PLOF: Independent  PATIENT GOALS: Improve walking/ safety.  Decrease low back pain.    NEXT MD VISIT: 06/08/22  OBJECTIVE:   DIAGNOSTIC FINDINGS:  CLINICAL DATA:  Chronic low back pain, unspecified laterality; right lumbar radiculopathy; spinal stenosis of lumbosacral region   EXAM: MRI LUMBAR SPINE WITHOUT CONTRAST   TECHNIQUE: Multiplanar, multisequence MR imaging of the lumbar spine was performed. No intravenous contrast was administered.   COMPARISON:  03/04/2018   FINDINGS: Segmentation:  Standard.   Alignment: Stable. Mild levocurvature. Trace anterolisthesis at L4-L5.   Vertebrae: Vertebral body heights are maintained. Mild degenerative marrow edema at L5-S1 eccentric to the left.   Conus  medullaris and cauda equina: Conus extends to the L2-L3 level. Conus and cauda equina appear normal.   Paraspinal and other soft tissues: Unremarkable.   Disc levels:   L1-L2:  No canal or foraminal stenosis.   L2-L3:  No canal or foraminal stenosis.   L3-L4:  Minor facet arthropathy.  No canal or foraminal stenosis.   L4-L5: Disc desiccation. Anterolisthesis with uncovering of disc.  Right foraminal protrusion. Moderate facet arthropathy with ligamentum flavum infolding. No canal or left foraminal stenosis. Minor right foraminal stenosis. Disc is in proximity to but does not definitely compress the exiting L4 nerve root.   L5-S1: Disc desiccation. Mild right and moderate left facet arthropathy. No canal or foraminal stenosis.   IMPRESSION: Lower lumbar degenerative changes as detailed above. Facet arthropathy has progressed. No new or progressive stenosis.     Electronically Signed   By: Guadlupe Spanish M.D.   On: 05/17/2021 13:59  PATIENT SURVEYS:  FOTO initial 45/ goal 54  SCREENING FOR RED FLAGS: Bowel or bladder incontinence: No Spinal tumors: No Cauda equina syndrome: No Compression fracture: No Abdominal aneurysm: No  COGNITION: Overall cognitive status: Within functional limits for tasks assessed     SENSATION: WFL  MUSCLE LENGTH: Hamstrings: Right 60 deg; Left 60 deg  (severe pain reported) Maisie Fus test: NT  (pain getting into position)  POSTURE: rounded shoulders, forward head, and flexed trunk   PALPATION: Generalized lumbar hypomobility/ tenderness with palpation.  LUMBAR ROM:   Stands in flexed posture (25 deg.)- pain limited with correction.  Limited/ poor standing tolerance and required 2 seated rest breaks during lumbar testing.   AROM eval  Flexion 25% limited   Extension 100% limited (pain)  Right lateral flexion 50% limited (pain)  Left lateral flexion 50% limited (pain)  Right rotation 50% limited (pain)  Left rotation 50% limited  (pain)   (Blank rows = not tested)  LOWER EXTREMITY ROM:      B LE AROM WFL except hip extension (pain)  LOWER EXTREMITY MMT:    MMT Right eval Left eval  Hip flexion 3+ (pain) 3+  Hip extension Unable to test Unable to test  Hip abduction 3 3+  Hip adduction 4 4  Hip internal rotation 4 4  Hip external rotation 4 4  Knee flexion 4 (LBP) 4  Knee extension 4 (LBP) 4  Ankle dorsiflexion    Ankle plantarflexion    Ankle inversion    Ankle eversion     (Blank rows = not tested)  LUMBAR SPECIAL TESTS:  Straight leg raise test: Positive and FABER test: Positive  FUNCTIONAL TESTS:  5 times sit to stand: TBD Berg Balance Scale: TBD  GAIT: Distance walked: in clinic Assistive device utilized: Single point cane  PT discussed use of rollator/ demonstrates Level of assistance: Modified independence Comments: Marked antalgic gait with limited hip/knee flexion and heavy B UE assist with SPC or rollator.  Poor endurance.  R LE muscle weakness/ fatigue reported.   TODAY'S TREATMENT:                                                                                                                              DATE: 05/30/2022  Subjective:  Pt reports 7/10 LBP this morning. Happy with new, seated exercises she feels will help her strength for LE stability. Sees PCP tomorrow and hopeful to get  order for a rollator.    There.ex:  Nustep L1 8 min. B UE/LE for LE strengthening and gentle lumbar mobility- discussed updated HEP, back surgery outcomes, and use of rollator for improved safety. Discussion on gym based program at [employer].   Seated marches: x20 alternating/LE   Seated LAQ's: x20/LE   Seated RLE sciatic flossing nerve glides: x12. VC's and PT demo for correct cervical and RLE sequencing.   STS from standard chair with SUE support: x6, RUE support and CGA. Limited ability to extend knees and hips.    Neuro Re-Ed:   Gait with rollator. Reviewed safety features for need  with seated rest. Pt with good understanding. Very R sided antalgic gait with SPC with step to pattern. Able to progress to step through pattern using rollator. Improved safety as pt does require heavy UE assist with gait. X1 seated rest break.    PATIENT EDUCATION:  Education details: Use of rollator/ upright posture Person educated: Patient Education method: Medical illustrator Education comprehension: verbalized understanding, returned demonstration, and needs further education  HOME EXERCISE PROGRAM: Access Code: [email or web address] URL: [email or web address] Date: 05/28/2022 Prepared by: Dorene Grebe  Exercises - Seated Long Arc Quad  - 2 x daily - 7 x weekly - 1 sets - 20 reps - Seated March  - 2 x daily - 7 x weekly - 1 sets - 20 reps - Seated Heel Toe Raises  - 2 x daily - 7 x weekly - 1 sets - 20 reps - Side Stepping with Counter Support  - 2 x daily - 7 x weekly - 1 sets - 20 reps - Sit to Stand with Counter Support  - 2 x daily - 7 x weekly - 1 sets - 10 reps  ASSESSMENT:  CLINICAL IMPRESSION: Continuing PT POC with focus on improving LBP, LE strength, and gait/balance. Pt continues to display improved safety/reduced falls risk using rollator benefiting from seat to improve independence with short community distances. Pt does however continue to demonstrate LE weakness and generally kyphotic gait due to significant LBP. Pt will benefit from skilled PT services to develop HEP to increase LE muscle strength/ upright posture to improve walking/ functional tasks.   OBJECTIVE IMPAIRMENTS: Abnormal gait, decreased activity tolerance, decreased balance, decreased coordination, decreased endurance, decreased knowledge of use of DME, decreased mobility, difficulty walking, decreased ROM, decreased strength, decreased safety awareness, hypomobility, impaired flexibility, improper body mechanics, postural dysfunction, and pain.   ACTIVITY LIMITATIONS: carrying, lifting,  bending, sitting, standing, squatting, sleeping, stairs, transfers, bed mobility, bathing, toileting, dressing, and locomotion level  PARTICIPATION LIMITATIONS: meal prep, cleaning, laundry, driving, shopping, and community activity  PERSONAL FACTORS: Fitness and Past/current experiences are also affecting patient's functional outcome.   REHAB POTENTIAL: Good  CLINICAL DECISION MAKING: Unstable/unpredictable  EVALUATION COMPLEXITY: High   GOALS: Goals reviewed with patient? Yes  SHORT TERM GOALS: Target date: 06/12/22  Pt. Will be able to tolerate standing 10 minutes with no increase c/o low back pain to improve household tasks/ ADL.   Baseline:  Limited standing tolerance (>8/10 pain in back/ R upper thigh) Goal status: INITIAL  LONG TERM GOALS: Target date: 07/10/22  Pt. Will increase FOTO to 54 to improve pain-free mobility.   Baseline: initial 45 Goal status: INITIAL  2.  Pt. Will complete Berg balance test and score >48/50 to decrease fall risk/ improve safety with daily activity/ walking.  Baseline: TBD Goal status: INITIAL  3.  Pt. Will report <5/10 low back/ R upper thigh pain  while walking.  Baseline: >8/10 pain Goal status: INITIAL  4.  Pt. Able to ambulate with use of SPC and consistent 2-point gait pattern for 10 minutes with no increase c/o back pain.   Baseline: limited endurance/ flexed posture/ antalgic gait.  Goal status: INITIAL  PLAN:  PT FREQUENCY: 2x/week  PT DURATION: 8 weeks  PLANNED INTERVENTIONS: Therapeutic exercises, Therapeutic activity, Neuromuscular re-education, Balance training, Gait training, Patient/Family education, Self Care, Joint mobilization, Stair training, Aquatic Therapy, Cryotherapy, Moist heat, Manual therapy, and Re-evaluation.  PLAN FOR NEXT SESSION: Supine lumbar/LE ROM and stretches.  Pt. Unable to tolerate prone  Delphia Grates. Fairly IV, PT, DPT Physical Therapist- [place]  [facility]  05/30/2022,  9:49 AM

## 2022-06-06 ENCOUNTER — Ambulatory Visit: Payer: Medicaid Other | Admitting: Physical Therapy

## 2022-06-06 ENCOUNTER — Encounter: Payer: Self-pay | Admitting: Physical Therapy

## 2022-06-06 DIAGNOSIS — M6281 Muscle weakness (generalized): Secondary | ICD-10-CM

## 2022-06-06 DIAGNOSIS — M5459 Other low back pain: Secondary | ICD-10-CM | POA: Diagnosis not present

## 2022-06-06 DIAGNOSIS — R269 Unspecified abnormalities of gait and mobility: Secondary | ICD-10-CM

## 2022-06-06 DIAGNOSIS — R293 Abnormal posture: Secondary | ICD-10-CM

## 2022-06-06 NOTE — Therapy (Signed)
OUTPATIENT PHYSICAL THERAPY THORACOLUMBAR TREATMENT  Patient Name: Lindsey Chavez MRN: [id number] DOB:[birth year], 52 y.o., female Today's Date: 06/06/2022  END OF SESSION:  PT End of Session - 06/06/22 0745     Visit Number 5    Number of Visits 16    Date for PT Re-Evaluation 07/10/22    PT Start Time 0759    PT Stop Time 0901    PT Time Calculation (min) 62 min    Activity Tolerance Patient tolerated treatment well    Behavior During Therapy WFL for tasks assessed/performed             Past Medical History:  Diagnosis Date   Asthma    Depressive disorder    Past Surgical History:  Procedure Laterality Date   MOUTH SURGERY     TUBAL LIGATION     Patient Active Problem List   Diagnosis Date Noted   Lymphedema 12/19/2017   Pain in both lower extremities 08/19/2017   Bilateral lower extremity edema 08/19/2017    PCP: Emogene Morgan, MD  REFERRING PROVIDER: Morene Crocker, MD  REFERRING DIAG: Other abnormalities of gait and mobility    Chronic low back pain  Rationale for Evaluation and Treatment: Rehabilitation  THERAPY DIAG:  Other low back pain  Abnormal posture  Muscle weakness (generalized)  Gait difficulty  ONSET DATE: Chronic  SUBJECTIVE:                                                                                                                                                                                           SUBJECTIVE STATEMENT: Pt. Reports chronic h/o low back pain due to scoliosis and bulging discs.  Pt. Has not worked since 2022 due to back pain and receives disability.  Pt. Reports no falls and no previous back surgeries.  Pt. Unable to drive due to back pain.  Pt. Takes Lyrica for pain and sleeps in recliner due to increase pain in bed.  Pt. Lives with 3 sons and requires SPC/ use of grab bar for safety.  Injection with Dr. Mariah Milling 6/24  PERTINENT HISTORY:  REFERRING PHYSICIAN: Aycock, Ngwe Achirimofo* PRIMARY CARE  PHYSICIAN: Aycock, Ngwe Achirimofor, MD  IMPRESSION/PLAN  [name] is a 52 y.o. female presenting for evaluation of  NUMBNESS/ TINGLING/ BACK PAIN/ DIFFICULTY SLEEPING/ DEPRESSION  - Unchanged. - Patient with PMH of HTN, lymphedema, lumbar radiculopathy, with ongoing neuropathy pain. Taking Lyrica 25 mg twice daily and 100 mg at night -Labs from last visit showed A1c is in prediabetic range. Alk phos also slightly elevated. Follow up with PCP. -Reviewed note from vascular recommending compression hose, elevating lower  extremities, consider lymph follow-up, recommended ultrasound lower extremities. - Provided prescription for 3 point cane -Referral to gait and balance physical therapy. -Change Lyrica to 25 mg in the morning, 25 mg in the afternoon and 100 mg at night.  -Continue to stay physically active. -Can start vitamin D 1000 units once daily. -Referral for sleep study to evaluate daytime fatigue. -Continue following with vascular. Continue wearing compression hose and elevating legs. -Alpha-lipoic acid is a naturally occurring, biological antioxidant, which might help with many chronic diseases, mostly diabetic peripheral neuropathy but can help other types of neuropathies as well. Usually given 600 mg/day, may take 3-5 wks before some symptomatic relief. Diabetic patients may need to reduce their diabetic meds. It may exacerbate thiamine deficiency in pt's with chronic malnutrition e.g. alcoholism  Follow up in 2-76mo  Medications previously tried:  CHIEF COMPLAINT & HPI  [name] is a 52 y.o. female presenting for evaluation of: Chief Complaint  Patient presents with  Numbness  Tingling  Back Pain  Sleeping Problem  Depression   NUMBNESS/ TINGLING/ BACK PAIN/ DIFFICULTY SLEEPING/ DEPRESSION  Patient reports with ongoing numbness and tingling in bilateral legs. Notes upcoming [place] LE in April per vascular recommendations. Following visit with vascular specialist, she was  recommended to wear compression hose throughout the day and remove them before bed and to continue to elevate bilateral legs. Notes ongoing swelling in bilateral legs. States she continues to have more frequent numbness, tingling and pain in right leg.Taking Lyrica to 25 mg in the morning, 25 mg in the afternoon and 100 mg at night. He is tolerating this well. Feels this is helpful with symptoms. States continuing to see physiatry for injections in spine. Notes reducing salt intake and getting daily exercise. Notes she spoke with nutritionist and is working on weight loss. Notes ongoing difficulty falling asleep and staying asleep. Reports getting 5 hours of sleep at night. States occasional drowsiness during the day. Notes ongoing appointment with therapist. Reports current mood is stable but ongoing depression. Endorses fatigue and low energy. No recent falls but feels that balance is not doing well. Needs new prescription for cane.  DATA SUMMARY: 12/05/2021 EMG LOWERS  Impression: Abnormal study. 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   05/17/2021 MRI LUMBAR SPINE WO CONTRAST  IMPRESSION:  Lower lumbar degenerative changes as detailed above. Facet  arthropathy has progressed. No new or progressive stenosis.   VISIT SUMMARIES:  MEDICATIONS Outpatient Medications Marked as Taking for the 02/20/22 encounter (Office Visit) with Gelene Mink, PA  Medication Sig  albuterol 90 mcg/actuation inhaler Inhale 2 inhalations into the lungs every 4 (four) hours as needed for Wheezing or Shortness of Breath  ARIPiprazole (ABILIFY) 10 MG tablet Take 10 mg by mouth once daily  busPIRone (BUSPAR) 10 MG tablet Take 10 mg by mouth 3 (three) times daily  diazePAM (VALIUM) 5 MG tablet 1-2 30 minutes before ESI  methocarbamoL (ROBAXIN) 500 MG tablet Take 500 mg by mouth 4 (four) times daily  pregabalin (LYRICA) 25 MG capsule Take 25  mg in the morning 25 mg in the afternoon and 100 mg at night  risperiDONE (RISPERDAL) 1 MG tablet Take 2 mg by mouth nightly   ALLERGIES Allergies  Allergen Reactions  Pork/Porcine Containing Products Hives   EXAM   Vitals:  02/20/22 0902  BP: 108/77  Pulse: 72  Weight: (!) 118.8 kg (262 lb)  PainSc: 7  PainLoc: Leg   Body  mass index is 39.84 kg/m.  GENERAL: Pleasant female, no distress. Normocephalic and atraumatic.  Baseline neurological exam below was obtained at prior office visit. Changes from today's visit appear in bold.   EYES: PERRLA EOM's intact  MUSCULOSKELETAL: Bulk - Normal Tone - Normal Pronator Drift - Absent bilaterally. Ambulation - Gait and station is slow, antalgic, limping.  Romberg - absent  R/L 5/5 Shoulder abduction (deltoid/supraspinatus, axillary/suprascapular n, C5) 5/5 Elbow flexion (biceps brachii, musculoskeletal n, C5-6) 5/5 Elbow extension (triceps, radial n, C7) 5/5 Finger adduction (interossei, ulnar n, T1)  4/5 Hip flexion (iliopsoas, L1/L2) 4/5 Knee flexion (hamstrings, sciatic n, L5/S1)  4/5 Knee extension (quadriceps, femoral n, L3/4) 5/5 Ankle dorsiflexion (tibialis anterior, deep fibular n, L4/5) 5/5 Ankle plantarflexion (gastroc, tibial n, S1)  Exam limited by pain.   NEUROLOGICAL: MENTAL STATUS: Patient is oriented to person, place and time.  Short-term memory is intact Long-term memory is intact.  Attention span and concentration are intact.  Naming and repetition are intact. Comprehension is intact.  Expressive speech is intact.  Patient's fund of knowledge is within normal limits for educational level.  CRANIAL NERVES: Visual acuity and visual fields are intact  Extraocular muscles are intact  Facial sensation is intact bilaterally  Facial strength is intact bilaterally  Hearing is intact bilaterally  Palate elevates midline, normal phonation  Shoulder shrug strength is intact  Tongue protrudes midline    SENSATION: Pain and temperature (spinothalamic tracts) is normal. Position and vibration (dorsal columns) is normal.  COORDINATION/CEREBELLAR: Finger to nose testing is intact.  REFLEXES:  Negative Hoffman's sign bilaterally.   PAST MEDICAL HISTORY Past Medical History:  Diagnosis Date  Asthma, unspecified asthma severity, unspecified whether complicated, unspecified whether persistent  Chest pain  Depression  ILD (interstitial lung disease) (CMS-HCC)  Major depressive disorder  Sickle cell trait (CMS-HCC)   PAST SURGICAL HISTORY Past Surgical History:  Procedure Laterality Date  dental surgery  LAPAROSCOPIC TUBAL LIGATION   PAIN:  Are you having pain? Yes: NPRS scale: 8/10 Pain location: R upper thigh Pain description: persistent Aggravating factors: standing/ walking Relieving factors: recliner/ medications  PRECAUTIONS: None  WEIGHT BEARING RESTRICTIONS: No  FALLS:  Has patient fallen in last 6 months? No  LIVING ENVIRONMENT: Lives with: lives with their family and lives with an adult companion Lives in: House/apartment Stairs: No Has following equipment at home: Single point cane, Tour manager, and Grab bars  OCCUPATION: Disability  PLOF: Independent  PATIENT GOALS: Improve walking/ safety.  Decrease low back pain.    NEXT MD VISIT: 06/08/22  OBJECTIVE:   DIAGNOSTIC FINDINGS:  CLINICAL DATA:  Chronic low back pain, unspecified laterality; right lumbar radiculopathy; spinal stenosis of lumbosacral region   EXAM: MRI LUMBAR SPINE WITHOUT CONTRAST   TECHNIQUE: Multiplanar, multisequence MR imaging of the lumbar spine was performed. No intravenous contrast was administered.   COMPARISON:  03/04/2018   FINDINGS: Segmentation:  Standard.   Alignment: Stable. Mild levocurvature. Trace anterolisthesis at L4-L5.   Vertebrae: Vertebral body heights are maintained. Mild degenerative marrow edema at L5-S1 eccentric to the left.   Conus  medullaris and cauda equina: Conus extends to the L2-L3 level. Conus and cauda equina appear normal.   Paraspinal and other soft tissues: Unremarkable.   Disc levels:   L1-L2:  No canal or foraminal stenosis.   L2-L3:  No canal or foraminal stenosis.   L3-L4:  Minor facet arthropathy.  No canal or foraminal stenosis.   L4-L5: Disc desiccation. Anterolisthesis with uncovering of disc.  Right foraminal protrusion. Moderate facet arthropathy with ligamentum flavum infolding. No canal or left foraminal stenosis. Minor right foraminal stenosis. Disc is in proximity to but does not definitely compress the exiting L4 nerve root.   L5-S1: Disc desiccation. Mild right and moderate left facet arthropathy. No canal or foraminal stenosis.   IMPRESSION: Lower lumbar degenerative changes as detailed above. Facet arthropathy has progressed. No new or progressive stenosis.     Electronically Signed   By: Guadlupe Spanish M.D.   On: 05/17/2021 13:59  PATIENT SURVEYS:  FOTO initial 45/ goal 54  SCREENING FOR RED FLAGS: Bowel or bladder incontinence: No Spinal tumors: No Cauda equina syndrome: No Compression fracture: No Abdominal aneurysm: No  COGNITION: Overall cognitive status: Within functional limits for tasks assessed     SENSATION: WFL  MUSCLE LENGTH: Hamstrings: Right 60 deg; Left 60 deg  (severe pain reported) Maisie Fus test: NT  (pain getting into position)  POSTURE: rounded shoulders, forward head, and flexed trunk   PALPATION: Generalized lumbar hypomobility/ tenderness with palpation.  LUMBAR ROM:   Stands in flexed posture (25 deg.)- pain limited with correction.  Limited/ poor standing tolerance and required 2 seated rest breaks during lumbar testing.   AROM eval  Flexion 25% limited   Extension 100% limited (pain)  Right lateral flexion 50% limited (pain)  Left lateral flexion 50% limited (pain)  Right rotation 50% limited (pain)  Left rotation 50% limited  (pain)   (Blank rows = not tested)  LOWER EXTREMITY ROM:      B LE AROM WFL except hip extension (pain)  LOWER EXTREMITY MMT:    MMT Right eval Left eval  Hip flexion 3+ (pain) 3+  Hip extension Unable to test Unable to test  Hip abduction 3 3+  Hip adduction 4 4  Hip internal rotation 4 4  Hip external rotation 4 4  Knee flexion 4 (LBP) 4  Knee extension 4 (LBP) 4  Ankle dorsiflexion    Ankle plantarflexion    Ankle inversion    Ankle eversion     (Blank rows = not tested)  LUMBAR SPECIAL TESTS:  Straight leg raise test: Positive and FABER test: Positive  FUNCTIONAL TESTS:  5 times sit to stand: TBD Berg Balance Scale: TBD  GAIT: Distance walked: in clinic Assistive device utilized: Single point cane  PT discussed use of rollator/ demonstrates Level of assistance: Modified independence Comments: Marked antalgic gait with limited hip/knee flexion and heavy B UE assist with SPC or rollator.  Poor endurance.  R LE muscle weakness/ fatigue reported.   TODAY'S TREATMENT:                                                                                                                              DATE: 06/06/2022  Subjective:  Pt reports persistent 7/10 LBP with standing/walking. Pt. Entered PT with use of SPC and states her PCP is sending order to medical supply store for rollator.  Pt. Excited to use a rollator to improve walking/ endurance/ safety.     There.ex:  Nustep L2 10 min. B UE/LE for LE strengthening and gentle lumbar mobility- discussed use of rollator for improved safety/ current HEP.   Seated marches/ LAQ: x20 alternating.  Seated RLE sciatic flossing nerve glides: x12. VC's and PT demo for correct cervical and RLE sequencing.   Neuro Re-Ed:   Gait with rollator. Reviewed safety features for need with seated rest. Pt with good understanding. Very R sided antalgic gait with SPC with step to pattern. Able to progress to step through pattern using rollator.  Improved safety as pt does require heavy UE assist with gait. X1 seated rest break.  STS from standard chair with SUE support: x8, RUE support and CGA. Limited ability to extend knees and hips.   Standing 6" step touches in //-bars 20x.  Light UE assist with focus on upright posture/ balance.    Forward/ backwards/ lateral walking in //-bars (3 laps) with minimal to no UE assist.  Moderate cuing to correct posture/ step length.    PT discussed stairs with pt. And pt. states she doesn't have stairs at home or any other places she goes.     PATIENT EDUCATION:  Education details: Use of rollator/ upright posture Person educated: Patient Education method: Medical illustrator Education comprehension: verbalized understanding, returned demonstration, and needs further education  HOME EXERCISE PROGRAM: Access Code: [email or web address] URL: [email or web address] Date: 05/28/2022 Prepared by: Dorene Grebe  Exercises - Seated Long Arc Quad  - 2 x daily - 7 x weekly - 1 sets - 20 reps - Seated March  - 2 x daily - 7 x weekly - 1 sets - 20 reps - Seated Heel Toe Raises  - 2 x daily - 7 x weekly - 1 sets - 20 reps - Side Stepping with Counter Support  - 2 x daily - 7 x weekly - 1 sets - 20 reps - Sit to Stand with Counter Support  - 2 x daily - 7 x weekly - 1 sets - 10 reps  ASSESSMENT:  CLINICAL IMPRESSION: Continuing PT POC with focus on improving LBP, LE strength, and gait/balance. Pt continues to display improved safety/reduced falls risk using rollator benefiting from seat to improve independence with short community distances. Pt does however continue to demonstrate LE weakness and generally kyphotic gait due to significant LBP. Pt will benefit from skilled PT services to develop HEP to increase LE muscle strength/ upright posture to improve walking/ functional tasks.   OBJECTIVE IMPAIRMENTS: Abnormal gait, decreased activity tolerance, decreased balance, decreased  coordination, decreased endurance, decreased knowledge of use of DME, decreased mobility, difficulty walking, decreased ROM, decreased strength, decreased safety awareness, hypomobility, impaired flexibility, improper body mechanics, postural dysfunction, and pain.   ACTIVITY LIMITATIONS: carrying, lifting, bending, sitting, standing, squatting, sleeping, stairs, transfers, bed mobility, bathing, toileting, dressing, and locomotion level  PARTICIPATION LIMITATIONS: meal prep, cleaning, laundry, driving, shopping, and community activity  PERSONAL FACTORS: Fitness and Past/current experiences are also affecting patient's functional outcome.   REHAB POTENTIAL: Good  CLINICAL DECISION MAKING: Unstable/unpredictable  EVALUATION COMPLEXITY: High   GOALS: Goals reviewed with patient? Yes  SHORT TERM GOALS: Target date: 06/12/22  Pt. Will be able to tolerate standing 10 minutes with no increase c/o low back pain to improve household tasks/ ADL.   Baseline:  Limited standing tolerance (>8/10 pain in back/ R upper thigh) Goal status: INITIAL  LONG TERM GOALS: Target date: 07/10/22  Pt. Will  increase FOTO to 54 to improve pain-free mobility.   Baseline: initial 45 Goal status: INITIAL  2.  Pt. Will complete Berg balance test and score >48/50 to decrease fall risk/ improve safety with daily activity/ walking.  Baseline: TBD Goal status: INITIAL  3.  Pt. Will report <5/10 low back/ R upper thigh pain while walking.  Baseline: >8/10 pain Goal status: INITIAL  4.  Pt. Able to ambulate with use of SPC and consistent 2-point gait pattern for 10 minutes with no increase c/o back pain.   Baseline: limited endurance/ flexed posture/ antalgic gait.  Goal status: INITIAL  PLAN:  PT FREQUENCY: 2x/week  PT DURATION: 8 weeks  PLANNED INTERVENTIONS: Therapeutic exercises, Therapeutic activity, Neuromuscular re-education, Balance training, Gait training, Patient/Family education, Self Care, Joint  mobilization, Stair training, Aquatic Therapy, Cryotherapy, Moist heat, Manual therapy, and Re-evaluation.  PLAN FOR NEXT SESSION: Supine lumbar/LE ROM and stretches.  Check on rollator use  [name], PT, DPT # [id number] Physical Therapist- [facility]  06/06/2022, 3:16 PM

## 2022-06-11 ENCOUNTER — Encounter: Payer: Self-pay | Admitting: Physical Therapy

## 2022-06-11 ENCOUNTER — Ambulatory Visit: Payer: Medicaid Other | Attending: Neurology | Admitting: Physical Therapy

## 2022-06-11 DIAGNOSIS — R269 Unspecified abnormalities of gait and mobility: Secondary | ICD-10-CM | POA: Diagnosis present

## 2022-06-11 DIAGNOSIS — M5459 Other low back pain: Secondary | ICD-10-CM | POA: Diagnosis present

## 2022-06-11 DIAGNOSIS — R293 Abnormal posture: Secondary | ICD-10-CM | POA: Diagnosis present

## 2022-06-11 DIAGNOSIS — M6281 Muscle weakness (generalized): Secondary | ICD-10-CM | POA: Insufficient documentation

## 2022-06-11 NOTE — Therapy (Signed)
OUTPATIENT PHYSICAL THERAPY THORACOLUMBAR TREATMENT  Patient Name: Lindsey Chavez MRN: [id number] DOB:October 24, 1970, 52 y.o., female Today's Date: 06/11/2022  END OF SESSION:  PT End of Session - 06/11/22 0753     Visit Number 6    Number of Visits 16    Date for PT Re-Evaluation 07/10/22    PT Start Time 0806    PT Stop Time 0900    PT Time Calculation (min) 54 min    Activity Tolerance Patient tolerated treatment well    Behavior During Therapy [facility] for tasks assessed/performed             Past Medical History:  Diagnosis Date   Asthma    Depressive disorder    Past Surgical History:  Procedure Laterality Date   MOUTH SURGERY     TUBAL LIGATION     Patient Active Problem List   Diagnosis Date Noted   Lymphedema 12/19/2017   Pain in both lower extremities 08/19/2017   Bilateral lower extremity edema 08/19/2017    PCP: Lindsey Morgan, MD  REFERRING PROVIDER: Morene Crocker, MD  REFERRING DIAG: Other abnormalities of gait and mobility    Chronic low back pain  Rationale for Evaluation and Treatment: Rehabilitation  THERAPY DIAG:  Other low back pain  Abnormal posture  Muscle weakness (generalized)  Gait difficulty  ONSET DATE: Chronic  SUBJECTIVE:                                                                                                                                                                                           SUBJECTIVE STATEMENT: Pt. Reports chronic h/o low back pain due to scoliosis and bulging discs.  Pt. Has not worked since 2022 due to back pain and receives disability.  Pt. Reports no falls and no previous back surgeries.  Pt. Unable to drive due to back pain.  Pt. Takes Lyrica for pain and sleeps in recliner due to increase pain in bed.  Pt. Lives with 3 sons and requires SPC/ use of grab bar for safety.  Injection with Dr. Mariah Chavez 6/24  PERTINENT HISTORY:  REFERRING PHYSICIAN: Aycock, Ngwe Chavez* PRIMARY CARE  PHYSICIAN: Aycock, Ngwe Achirimofor, MD  IMPRESSION/PLAN  [name] is a 52 y.o. female presenting for evaluation of  NUMBNESS/ TINGLING/ BACK PAIN/ DIFFICULTY SLEEPING/ DEPRESSION  - Unchanged. - Patient with PMH of HTN, lymphedema, lumbar radiculopathy, with ongoing neuropathy pain. Taking Lyrica 25 mg twice daily and 100 mg at night -Labs from last visit showed A1c is in prediabetic range. Alk phos also slightly elevated. Follow up with PCP. -Reviewed note from vascular recommending compression hose, elevating lower  extremities, consider lymph follow-up, recommended ultrasound lower extremities. - Provided prescription for 3 point cane -Referral to gait and balance physical therapy. -Change Lyrica to 25 mg in the morning, 25 mg in the afternoon and 100 mg at night.  -Continue to stay physically active. -Can start vitamin D 1000 units once daily. -Referral for sleep study to evaluate daytime fatigue. -Continue following with vascular. Continue wearing compression hose and elevating legs. -Alpha-lipoic acid is a naturally occurring, biological antioxidant, which might help with many chronic diseases, mostly diabetic peripheral neuropathy but can help other types of neuropathies as well. Usually given 600 mg/day, may take 3-5 wks before some symptomatic relief. Diabetic patients may need to reduce their diabetic meds. It may exacerbate thiamine deficiency in pt's with chronic malnutrition e.g. alcoholism  Follow up in 2-52mo  Medications previously tried:  CHIEF COMPLAINT & HPI  [name] is a 52 y.o. female presenting for evaluation of: Chief Complaint  Patient presents with  Numbness  Tingling  Back Pain  Sleeping Problem  Depression   NUMBNESS/ TINGLING/ BACK PAIN/ DIFFICULTY SLEEPING/ DEPRESSION  Patient reports with ongoing numbness and tingling in bilateral legs. Notes upcoming [place] LE in April per vascular recommendations. Following visit with vascular specialist, she was  recommended to wear compression hose throughout the day and remove them before bed and to continue to elevate bilateral legs. Notes ongoing swelling in bilateral legs. States she continues to have more frequent numbness, tingling and pain in right leg.Taking Lyrica to 25 mg in the morning, 25 mg in the afternoon and 100 mg at night. He is tolerating this well. Feels this is helpful with symptoms. States continuing to see physiatry for injections in spine. Notes reducing salt intake and getting daily exercise. Notes she spoke with nutritionist and is working on weight loss. Notes ongoing difficulty falling asleep and staying asleep. Reports getting 5 hours of sleep at night. States occasional drowsiness during the day. Notes ongoing appointment with therapist. Reports current mood is stable but ongoing depression. Endorses fatigue and low energy. No recent falls but feels that balance is not doing well. Needs new prescription for cane.  DATA SUMMARY: 12/05/2021 EMG LOWERS  Impression: Abnormal study. 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   05/17/2021 MRI LUMBAR SPINE WO CONTRAST  IMPRESSION:  Lower lumbar degenerative changes as detailed above. Facet  arthropathy has progressed. No new or progressive stenosis.   VISIT SUMMARIES:  MEDICATIONS Outpatient Medications Marked as Taking for the 02/20/22 encounter (Office Visit) with Gelene Mink, PA  Medication Sig  albuterol 90 mcg/actuation inhaler Inhale 2 inhalations into the lungs every 4 (four) hours as needed for Wheezing or Shortness of Breath  ARIPiprazole (ABILIFY) 10 MG tablet Take 10 mg by mouth once daily  busPIRone (BUSPAR) 10 MG tablet Take 10 mg by mouth 3 (three) times daily  diazePAM (VALIUM) 5 MG tablet 1-2 30 minutes before ESI  methocarbamoL (ROBAXIN) 500 MG tablet Take 500 mg by mouth 4 (four) times daily  pregabalin (LYRICA) 25 MG capsule Take 25  mg in the morning 25 mg in the afternoon and 100 mg at night  risperiDONE (RISPERDAL) 1 MG tablet Take 2 mg by mouth nightly   ALLERGIES Allergies  Allergen Reactions  Pork/Porcine Containing Products Hives   EXAM   Vitals:  02/20/22 0902  BP: 108/77  Pulse: 72  Weight: (!) 118.8 kg (262 lb)  PainSc: 7  PainLoc: Leg   Body  mass index is 39.84 kg/m.  GENERAL: Pleasant female, no distress. Normocephalic and atraumatic.  Baseline neurological exam below was obtained at prior office visit. Changes from today's visit appear in bold.   EYES: PERRLA EOM's intact  MUSCULOSKELETAL: Bulk - Normal Tone - Normal Pronator Drift - Absent bilaterally. Ambulation - Gait and station is slow, antalgic, limping.  Romberg - absent  R/L 5/5 Shoulder abduction (deltoid/supraspinatus, axillary/suprascapular n, C5) 5/5 Elbow flexion (biceps brachii, musculoskeletal n, C5-6) 5/5 Elbow extension (triceps, radial n, C7) 5/5 Finger adduction (interossei, ulnar n, T1)  4/5 Hip flexion (iliopsoas, L1/L2) 4/5 Knee flexion (hamstrings, sciatic n, L5/S1)  4/5 Knee extension (quadriceps, femoral n, L3/4) 5/5 Ankle dorsiflexion (tibialis anterior, deep fibular n, L4/5) 5/5 Ankle plantarflexion (gastroc, tibial n, S1)  Exam limited by pain.   NEUROLOGICAL: MENTAL STATUS: Patient is oriented to person, place and time.  Short-term memory is intact Long-term memory is intact.  Attention span and concentration are intact.  Naming and repetition are intact. Comprehension is intact.  Expressive speech is intact.  Patient's fund of knowledge is within normal limits for educational level.  CRANIAL NERVES: Visual acuity and visual fields are intact  Extraocular muscles are intact  Facial sensation is intact bilaterally  Facial strength is intact bilaterally  Hearing is intact bilaterally  Palate elevates midline, normal phonation  Shoulder shrug strength is intact  Tongue protrudes midline    SENSATION: Pain and temperature (spinothalamic tracts) is normal. Position and vibration (dorsal columns) is normal.  COORDINATION/CEREBELLAR: Finger to nose testing is intact.  REFLEXES:  Negative Hoffman's sign bilaterally.   PAST MEDICAL HISTORY Past Medical History:  Diagnosis Date  Asthma, unspecified asthma severity, unspecified whether complicated, unspecified whether persistent  Chest pain  Depression  ILD (interstitial lung disease) (CMS-HCC)  Major depressive disorder  Sickle cell trait (CMS-HCC)   PAST SURGICAL HISTORY Past Surgical History:  Procedure Laterality Date  dental surgery  LAPAROSCOPIC TUBAL LIGATION   PAIN:  Are you having pain? Yes: NPRS scale: 8/10 Pain location: R upper thigh Pain description: persistent Aggravating factors: standing/ walking Relieving factors: recliner/ medications  PRECAUTIONS: None  WEIGHT BEARING RESTRICTIONS: No  FALLS:  Has patient fallen in last 6 months? No  LIVING ENVIRONMENT: Lives with: lives with their family and lives with an adult companion Lives in: House/apartment Stairs: No Has following equipment at home: Single point cane, Tour manager, and Grab bars  OCCUPATION: Disability  PLOF: Independent  PATIENT GOALS: Improve walking/ safety.  Decrease low back pain.    NEXT MD VISIT: 06/08/22  OBJECTIVE:   DIAGNOSTIC FINDINGS:  CLINICAL DATA:  Chronic low back pain, unspecified laterality; right lumbar radiculopathy; spinal stenosis of lumbosacral region   EXAM: MRI LUMBAR SPINE WITHOUT CONTRAST   TECHNIQUE: Multiplanar, multisequence MR imaging of the lumbar spine was performed. No intravenous contrast was administered.   COMPARISON:  03/04/2018   FINDINGS: Segmentation:  Standard.   Alignment: Stable. Mild levocurvature. Trace anterolisthesis at L4-L5.   Vertebrae: Vertebral body heights are maintained. Mild degenerative marrow edema at L5-S1 eccentric to the left.   Conus  medullaris and cauda equina: Conus extends to the L2-L3 level. Conus and cauda equina appear normal.   Paraspinal and other soft tissues: Unremarkable.   Disc levels:   L1-L2:  No canal or foraminal stenosis.   L2-L3:  No canal or foraminal stenosis.   L3-L4:  Minor facet arthropathy.  No canal or foraminal stenosis.   L4-L5: Disc desiccation. Anterolisthesis with uncovering of disc.  Right foraminal protrusion. Moderate facet arthropathy with ligamentum flavum infolding. No canal or left foraminal stenosis. Minor right foraminal stenosis. Disc is in proximity to but does not definitely compress the exiting L4 nerve root.   L5-S1: Disc desiccation. Mild right and moderate left facet arthropathy. No canal or foraminal stenosis.   IMPRESSION: Lower lumbar degenerative changes as detailed above. Facet arthropathy has progressed. No new or progressive stenosis.     Electronically Signed   By: Guadlupe Spanish M.D.   On: 05/17/2021 13:59  PATIENT SURVEYS:  FOTO initial 45/ goal 54  SCREENING FOR RED FLAGS: Bowel or bladder incontinence: No Spinal tumors: No Cauda equina syndrome: No Compression fracture: No Abdominal aneurysm: No  COGNITION: Overall cognitive status: Within functional limits for tasks assessed     SENSATION: WFL  MUSCLE LENGTH: Hamstrings: Right 60 deg; Left 60 deg  (severe pain reported) Maisie Fus test: NT  (pain getting into position)  POSTURE: rounded shoulders, forward head, and flexed trunk   PALPATION: Generalized lumbar hypomobility/ tenderness with palpation.  LUMBAR ROM:   Stands in flexed posture (25 deg.)- pain limited with correction.  Limited/ poor standing tolerance and required 2 seated rest breaks during lumbar testing.   AROM eval  Flexion 25% limited   Extension 100% limited (pain)  Right lateral flexion 50% limited (pain)  Left lateral flexion 50% limited (pain)  Right rotation 50% limited (pain)  Left rotation 50% limited  (pain)   (Blank rows = not tested)  LOWER EXTREMITY ROM:      B LE AROM WFL except hip extension (pain)  LOWER EXTREMITY MMT:    MMT Right eval Left eval  Hip flexion 3+ (pain) 3+  Hip extension Unable to test Unable to test  Hip abduction 3 3+  Hip adduction 4 4  Hip internal rotation 4 4  Hip external rotation 4 4  Knee flexion 4 (LBP) 4  Knee extension 4 (LBP) 4  Ankle dorsiflexion    Ankle plantarflexion    Ankle inversion    Ankle eversion     (Blank rows = not tested)  LUMBAR SPECIAL TESTS:  Straight leg raise test: Positive and FABER test: Positive  FUNCTIONAL TESTS:  5 times sit to stand: TBD Berg Balance Scale: TBD  GAIT: Distance walked: in clinic Assistive device utilized: Single point cane  PT discussed use of rollator/ demonstrates Level of assistance: Modified independence Comments: Marked antalgic gait with limited hip/knee flexion and heavy B UE assist with SPC or rollator.  Poor endurance.  R LE muscle weakness/ fatigue reported.   TODAY'S TREATMENT:                                                                                                                              DATE: 06/11/2022  Subjective:  Pt reports persistent 7/10 LBP with standing/walking. Pt. Entered PT with use of SPC and still waiting on rollator.  Pt. Reports increase R foot/lower leg tingling/  numbness.  Pt. Motivated to increase mobility/ decrease low back symptoms with daily tasks.    There.ex:  Nustep L3 10 min. B UE/LE for LE strengthening and gentle lumbar mobility- discussed use of rollator for improved safety/ current HEP.   Seated RLE sciatic flossing nerve glides: x8. VC's and PT demo for correct cervical and RLE sequencing.   Seated marches/ LAQ on blue mat table: x20 alternating.  Neuro Re-Ed:   Gait with rollator. Reviewed safety features for need with seated rest. Pt with good understanding. Very R sided antalgic gait with SPC with step to pattern. Able to  progress to step through pattern using rollator. Improved safety as pt does require heavy UE assist with gait. X1 seated rest break.  STS from blue mat table (varying heights) with SUE support: x5, RUE support and CGA. Limited ability to extend knees and hips.   Forward/ backwards/ lateral walking in //-bars (2 laps x 2) with minimal to no UE assist.  Moderate cuing to correct posture/ step length.    Standing 3" plinth step overs/ 6" step touches in //-bars 10x each.  Light UE assist with focus on upright posture/ balance.  Increase R LE weakness/ nerve discomfort with increase reps.  Seated rest break.     PATIENT EDUCATION:  Education details: Use of rollator/ upright posture Person educated: Patient Education method: Medical illustrator Education comprehension: verbalized understanding, returned demonstration, and needs further education  HOME EXERCISE PROGRAM: Access Code: [email or web address] URL: [email or web address] Date: 05/28/2022 Prepared by: Dorene Grebe  Exercises - Seated Long Arc Quad  - 2 x daily - 7 x weekly - 1 sets - 20 reps - Seated March  - 2 x daily - 7 x weekly - 1 sets - 20 reps - Seated Heel Toe Raises  - 2 x daily - 7 x weekly - 1 sets - 20 reps - Side Stepping with Counter Support  - 2 x daily - 7 x weekly - 1 sets - 20 reps - Sit to Stand with Counter Support  - 2 x daily - 7 x weekly - 1 sets - 10 reps  ASSESSMENT:  CLINICAL IMPRESSION: Continuing PT POC with focus on improving LBP, LE strength, and gait/balance. Pt continues to display improved safety/reduced falls risk using rollator benefiting from seat to improve independence with short community distances. Pt does however continue to demonstrate LE weakness and generally kyphotic gait due to significant LBP. Pt will benefit from skilled PT services to develop HEP to increase LE muscle strength/ upright posture to improve walking/ functional tasks.   OBJECTIVE IMPAIRMENTS: Abnormal gait,  decreased activity tolerance, decreased balance, decreased coordination, decreased endurance, decreased knowledge of use of DME, decreased mobility, difficulty walking, decreased ROM, decreased strength, decreased safety awareness, hypomobility, impaired flexibility, improper body mechanics, postural dysfunction, and pain.   ACTIVITY LIMITATIONS: carrying, lifting, bending, sitting, standing, squatting, sleeping, stairs, transfers, bed mobility, bathing, toileting, dressing, and locomotion level  PARTICIPATION LIMITATIONS: meal prep, cleaning, laundry, driving, shopping, and community activity  PERSONAL FACTORS: Fitness and Past/current experiences are also affecting patient's functional outcome.   REHAB POTENTIAL: Good  CLINICAL DECISION MAKING: Unstable/unpredictable  EVALUATION COMPLEXITY: High   GOALS: Goals reviewed with patient? Yes  SHORT TERM GOALS: Target date: 06/12/22  Pt. Will be able to tolerate standing 10 minutes with no increase c/o low back pain to improve household tasks/ ADL.   Baseline:  Limited standing tolerance (>8/10 pain in back/ R upper thigh) Goal status: INITIAL  LONG TERM  GOALS: Target date: 07/10/22  Pt. Will increase FOTO to 54 to improve pain-free mobility.   Baseline: initial 45 Goal status: INITIAL  2.  Pt. Will complete Berg balance test and score >48/50 to decrease fall risk/ improve safety with daily activity/ walking.  Baseline: TBD Goal status: INITIAL  3.  Pt. Will report <5/10 low back/ R upper thigh pain while walking.  Baseline: >8/10 pain Goal status: INITIAL  4.  Pt. Able to ambulate with use of SPC and consistent 2-point gait pattern for 10 minutes with no increase c/o back pain.   Baseline: limited endurance/ flexed posture/ antalgic gait.  Goal status: INITIAL  PLAN:  PT FREQUENCY: 2x/week  PT DURATION: 8 weeks  PLANNED INTERVENTIONS: Therapeutic exercises, Therapeutic activity, Neuromuscular re-education, Balance training,  Gait training, Patient/Family education, Self Care, Joint mobilization, Stair training, Aquatic Therapy, Cryotherapy, Moist heat, Manual therapy, and Re-evaluation.  PLAN FOR NEXT SESSION: Supine lumbar/LE ROM and stretches.  Check on rollator use. CHECK STG  [name], PT, DPT # [phone] Physical Therapist- [facility]  06/11/2022, 7:50 PM

## 2022-06-13 ENCOUNTER — Ambulatory Visit: Payer: Medicaid Other | Admitting: Physical Therapy

## 2022-06-18 ENCOUNTER — Ambulatory Visit: Payer: Medicaid Other | Admitting: Physical Therapy

## 2022-06-18 ENCOUNTER — Encounter: Payer: Self-pay | Admitting: Physical Therapy

## 2022-06-18 DIAGNOSIS — R269 Unspecified abnormalities of gait and mobility: Secondary | ICD-10-CM

## 2022-06-18 DIAGNOSIS — M5459 Other low back pain: Secondary | ICD-10-CM | POA: Diagnosis not present

## 2022-06-18 DIAGNOSIS — M6281 Muscle weakness (generalized): Secondary | ICD-10-CM

## 2022-06-18 DIAGNOSIS — R293 Abnormal posture: Secondary | ICD-10-CM

## 2022-06-18 NOTE — Therapy (Signed)
OUTPATIENT PHYSICAL THERAPY THORACOLUMBAR TREATMENT  Patient Name: Lindsey Chavez MRN: [id number] DOB:[birth year], 52 y.o., female Today's Date: 06/18/2022  END OF SESSION:  PT End of Session - 06/18/22 0825     Visit Number 7    Number of Visits 16    Date for PT Re-Evaluation 07/10/22    PT Start Time 0811    PT Stop Time 0900    PT Time Calculation (min) 49 min    Activity Tolerance Patient tolerated treatment well    Behavior During Therapy [facility] for tasks assessed/performed             Past Medical History:  Diagnosis Date   Asthma    Depressive disorder    Past Surgical History:  Procedure Laterality Date   MOUTH SURGERY     TUBAL LIGATION     Patient Active Problem List   Diagnosis Date Noted   Lymphedema 12/19/2017   Pain in both lower extremities 08/19/2017   Bilateral lower extremity edema 08/19/2017    PCP: Emogene Morgan, MD  REFERRING PROVIDER: Morene Crocker, MD  REFERRING DIAG: Other abnormalities of gait and mobility    Chronic low back pain  Rationale for Evaluation and Treatment: Rehabilitation  THERAPY DIAG:  Other low back pain  Abnormal posture  Muscle weakness (generalized)  Gait difficulty  ONSET DATE: Chronic  SUBJECTIVE:                                                                                                                                                                                           SUBJECTIVE STATEMENT: Pt. Reports chronic h/o low back pain due to scoliosis and bulging discs.  Pt. Has not worked since 2022 due to back pain and receives disability.  Pt. Reports no falls and no previous back surgeries.  Pt. Unable to drive due to back pain.  Pt. Takes Lyrica for pain and sleeps in recliner due to increase pain in bed.  Pt. Lives with 3 sons and requires SPC/ use of grab bar for safety.  Injection with Dr. Mariah Milling 6/24  PERTINENT HISTORY:  REFERRING PHYSICIAN: Aycock, Ngwe Achirimofo* PRIMARY CARE  PHYSICIAN: Aycock, Ngwe Achirimofor, MD  IMPRESSION/PLAN  Lindsey Chavez is a 52 y.o. female presenting for evaluation of  NUMBNESS/ TINGLING/ BACK PAIN/ DIFFICULTY SLEEPING/ DEPRESSION  - Unchanged. - Patient with PMH of HTN, lymphedema, lumbar radiculopathy, with ongoing neuropathy pain. Taking Lyrica 25 mg twice daily and 100 mg at night -Labs from last visit showed A1c is in prediabetic range. Alk phos also slightly elevated. Follow up with PCP. -Reviewed note from vascular recommending compression hose, elevating lower  extremities, consider lymph follow-up, recommended ultrasound lower extremities. - Provided prescription for 3 point cane -Referral to gait and balance physical therapy. -Change Lyrica to 25 mg in the morning, 25 mg in the afternoon and 100 mg at night.  -Continue to stay physically active. -Can start vitamin D 1000 units once daily. -Referral for sleep study to evaluate daytime fatigue. -Continue following with vascular. Continue wearing compression hose and elevating legs. -Alpha-lipoic acid is a naturally occurring, biological antioxidant, which might help with many chronic diseases, mostly diabetic peripheral neuropathy but can help other types of neuropathies as well. Usually given 600 mg/day, may take 3-5 wks before some symptomatic relief. Diabetic patients may need to reduce their diabetic meds. It may exacerbate thiamine deficiency in pt's with chronic malnutrition e.g. alcoholism  Follow up in 2-76mo  Medications previously tried:  CHIEF COMPLAINT & HPI  [name] is a 52 y.o. female presenting for evaluation of: Chief Complaint  Patient presents with  Numbness  Tingling  Back Pain  Sleeping Problem  Depression   NUMBNESS/ TINGLING/ BACK PAIN/ DIFFICULTY SLEEPING/ DEPRESSION  Patient reports with ongoing numbness and tingling in bilateral legs. Notes upcoming [place] LE in April per vascular recommendations. Following visit with vascular specialist, she was  recommended to wear compression hose throughout the day and remove them before bed and to continue to elevate bilateral legs. Notes ongoing swelling in bilateral legs. States she continues to have more frequent numbness, tingling and pain in right leg.Taking Lyrica to 25 mg in the morning, 25 mg in the afternoon and 100 mg at night. He is tolerating this well. Feels this is helpful with symptoms. States continuing to see physiatry for injections in spine. Notes reducing salt intake and getting daily exercise. Notes she spoke with nutritionist and is working on weight loss. Notes ongoing difficulty falling asleep and staying asleep. Reports getting 5 hours of sleep at night. States occasional drowsiness during the day. Notes ongoing appointment with therapist. Reports current mood is stable but ongoing depression. Endorses fatigue and low energy. No recent falls but feels that balance is not doing well. Needs new prescription for cane.  DATA SUMMARY: 12/05/2021 EMG LOWERS  Impression: Abnormal study. 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   05/17/2021 MRI LUMBAR SPINE WO CONTRAST  IMPRESSION:  Lower lumbar degenerative changes as detailed above. Facet  arthropathy has progressed. No new or progressive stenosis.   VISIT SUMMARIES:  MEDICATIONS Outpatient Medications Marked as Taking for the 02/20/22 encounter (Office Visit) with Gelene Mink, PA  Medication Sig  albuterol 90 mcg/actuation inhaler Inhale 2 inhalations into the lungs every 4 (four) hours as needed for Wheezing or Shortness of Breath  ARIPiprazole (ABILIFY) 10 MG tablet Take 10 mg by mouth once daily  busPIRone (BUSPAR) 10 MG tablet Take 10 mg by mouth 3 (three) times daily  diazePAM (VALIUM) 5 MG tablet 1-2 30 minutes before ESI  methocarbamoL (ROBAXIN) 500 MG tablet Take 500 mg by mouth 4 (four) times daily  pregabalin (LYRICA) 25 MG capsule Take 25  mg in the morning 25 mg in the afternoon and 100 mg at night  risperiDONE (RISPERDAL) 1 MG tablet Take 2 mg by mouth nightly   ALLERGIES Allergies  Allergen Reactions  Pork/Porcine Containing Products Hives   EXAM   Vitals:  02/20/22 0902  BP: 108/77  Pulse: 72  Weight: (!) 118.8 kg (262 lb)  PainSc: 7  PainLoc: Leg   Body  mass index is 39.84 kg/m.  GENERAL: Pleasant female, no distress. Normocephalic and atraumatic.  Baseline neurological exam below was obtained at prior office visit. Changes from today's visit appear in bold.   EYES: PERRLA EOM's intact  MUSCULOSKELETAL: Bulk - Normal Tone - Normal Pronator Drift - Absent bilaterally. Ambulation - Gait and station is slow, antalgic, limping.  Romberg - absent  R/L 5/5 Shoulder abduction (deltoid/supraspinatus, axillary/suprascapular n, C5) 5/5 Elbow flexion (biceps brachii, musculoskeletal n, C5-6) 5/5 Elbow extension (triceps, radial n, C7) 5/5 Finger adduction (interossei, ulnar n, T1)  4/5 Hip flexion (iliopsoas, L1/L2) 4/5 Knee flexion (hamstrings, sciatic n, L5/S1)  4/5 Knee extension (quadriceps, femoral n, L3/4) 5/5 Ankle dorsiflexion (tibialis anterior, deep fibular n, L4/5) 5/5 Ankle plantarflexion (gastroc, tibial n, S1)  Exam limited by pain.   NEUROLOGICAL: MENTAL STATUS: Patient is oriented to person, place and time.  Short-term memory is intact Long-term memory is intact.  Attention span and concentration are intact.  Naming and repetition are intact. Comprehension is intact.  Expressive speech is intact.  Patient's fund of knowledge is within normal limits for educational level.  CRANIAL NERVES: Visual acuity and visual fields are intact  Extraocular muscles are intact  Facial sensation is intact bilaterally  Facial strength is intact bilaterally  Hearing is intact bilaterally  Palate elevates midline, normal phonation  Shoulder shrug strength is intact  Tongue protrudes midline    SENSATION: Pain and temperature (spinothalamic tracts) is normal. Position and vibration (dorsal columns) is normal.  COORDINATION/CEREBELLAR: Finger to nose testing is intact.  REFLEXES:  Negative Hoffman's sign bilaterally.   PAST MEDICAL HISTORY Past Medical History:  Diagnosis Date  Asthma, unspecified asthma severity, unspecified whether complicated, unspecified whether persistent  Chest pain  Depression  ILD (interstitial lung disease) (CMS-HCC)  Major depressive disorder  Sickle cell trait (CMS-HCC)   PAST SURGICAL HISTORY Past Surgical History:  Procedure Laterality Date  dental surgery  LAPAROSCOPIC TUBAL LIGATION   PAIN:  Are you having pain? Yes: NPRS scale: 8/10 Pain location: R upper thigh Pain description: persistent Aggravating factors: standing/ walking Relieving factors: recliner/ medications  PRECAUTIONS: None  WEIGHT BEARING RESTRICTIONS: No  FALLS:  Has patient fallen in last 6 months? No  LIVING ENVIRONMENT: Lives with: lives with their family and lives with an adult companion Lives in: House/apartment Stairs: No Has following equipment at home: Single point cane, Tour manager, and Grab bars  OCCUPATION: Disability  PLOF: Independent  PATIENT GOALS: Improve walking/ safety.  Decrease low back pain.    NEXT MD VISIT: 06/08/22  OBJECTIVE:   DIAGNOSTIC FINDINGS:  CLINICAL DATA:  Chronic low back pain, unspecified laterality; right lumbar radiculopathy; spinal stenosis of lumbosacral region   EXAM: MRI LUMBAR SPINE WITHOUT CONTRAST   TECHNIQUE: Multiplanar, multisequence MR imaging of the lumbar spine was performed. No intravenous contrast was administered.   COMPARISON:  03/04/2018   FINDINGS: Segmentation:  Standard.   Alignment: Stable. Mild levocurvature. Trace anterolisthesis at L4-L5.   Vertebrae: Vertebral body heights are maintained. Mild degenerative marrow edema at L5-S1 eccentric to the left.   Conus  medullaris and cauda equina: Conus extends to the L2-L3 level. Conus and cauda equina appear normal.   Paraspinal and other soft tissues: Unremarkable.   Disc levels:   L1-L2:  No canal or foraminal stenosis.   L2-L3:  No canal or foraminal stenosis.   L3-L4:  Minor facet arthropathy.  No canal or foraminal stenosis.   L4-L5: Disc desiccation. Anterolisthesis with uncovering of disc.  Right foraminal protrusion. Moderate facet arthropathy with ligamentum flavum infolding. No canal or left foraminal stenosis. Minor right foraminal stenosis. Disc is in proximity to but does not definitely compress the exiting L4 nerve root.   L5-S1: Disc desiccation. Mild right and moderate left facet arthropathy. No canal or foraminal stenosis.   IMPRESSION: Lower lumbar degenerative changes as detailed above. Facet arthropathy has progressed. No new or progressive stenosis.     Electronically Signed   By: Guadlupe Spanish M.D.   On: 05/17/2021 13:59  PATIENT SURVEYS:  FOTO initial 45/ goal 54  SCREENING FOR RED FLAGS: Bowel or bladder incontinence: No Spinal tumors: No Cauda equina syndrome: No Compression fracture: No Abdominal aneurysm: No  COGNITION: Overall cognitive status: Within functional limits for tasks assessed     SENSATION: WFL  MUSCLE LENGTH: Hamstrings: Right 60 deg; Left 60 deg  (severe pain reported) Maisie Fus test: NT  (pain getting into position)  POSTURE: rounded shoulders, forward head, and flexed trunk   PALPATION: Generalized lumbar hypomobility/ tenderness with palpation.  LUMBAR ROM:   Stands in flexed posture (25 deg.)- pain limited with correction.  Limited/ poor standing tolerance and required 2 seated rest breaks during lumbar testing.   AROM eval  Flexion 25% limited   Extension 100% limited (pain)  Right lateral flexion 50% limited (pain)  Left lateral flexion 50% limited (pain)  Right rotation 50% limited (pain)  Left rotation 50% limited  (pain)   (Blank rows = not tested)  LOWER EXTREMITY ROM:      B LE AROM WFL except hip extension (pain)  LOWER EXTREMITY MMT:    MMT Right eval Left eval  Hip flexion 3+ (pain) 3+  Hip extension Unable to test Unable to test  Hip abduction 3 3+  Hip adduction 4 4  Hip internal rotation 4 4  Hip external rotation 4 4  Knee flexion 4 (LBP) 4  Knee extension 4 (LBP) 4  Ankle dorsiflexion    Ankle plantarflexion    Ankle inversion    Ankle eversion     (Blank rows = not tested)  LUMBAR SPECIAL TESTS:  Straight leg raise test: Positive and FABER test: Positive  FUNCTIONAL TESTS:  5 times sit to stand: TBD Berg Balance Scale: TBD  GAIT: Distance walked: in clinic Assistive device utilized: Single point cane  PT discussed use of rollator/ demonstrates Level of assistance: Modified independence Comments: Marked antalgic gait with limited hip/knee flexion and heavy B UE assist with SPC or rollator.  Poor endurance.  R LE muscle weakness/ fatigue reported.   TODAY'S TREATMENT:                                                                                                                              DATE: 06/18/2022  Subjective:  Pt reports persistent 6/10 LBP (R low back with radicular symptoms to behind R knee) with standing/walking. Pt. Entered PT with use of SPC and still waiting  on rollator.  Pt. Motivated to increase mobility/ decrease low back symptoms with daily tasks.  Pt. Has home sleep scheduled on 8/5.    There.ex:  Nustep L3-4 10 min. B UE/LE for LE strengthening and gentle lumbar mobility- discussed use of rollator for improved safety/ current HEP.   Seated RLE sciatic flossing nerve glides: x4. VC's and PT demo for correct cervical and RLE sequencing.  Supine knee to chest/ hamstring stretches (muscle guarding on R esp. With knee to chest secondary to pain).    Supine marching (R hip/back discomfort)/ hip abduction (unilateral) 10x2 each.    Walking in clinic  to //-bars with SPC (moderate antalgic gait)  Neuro Re-Ed:   Forward/ backwards/ lateral walking in //-bars (2 laps x 2) with no UE assist.  Moderate cuing to correct posture/ step length.    Gait with rollator. Reviewed safety features for need with seated rest. Pt with good understanding. Consistent recip. gait pattern using rollator. Improved safety as pt does require heavy UE assist with gait. X1 seated rest break.  Walking in gym/ hallway with use of rollator 2 laps with 1 seated rest break.  Discussed "pins and needles" in R LE.     PATIENT EDUCATION:  Education details: Use of rollator/ upright posture Person educated: Patient Education method: Medical illustrator Education comprehension: verbalized understanding, returned demonstration, and needs further education  HOME EXERCISE PROGRAM: Access Code: [email or web address] URL: [email or web address] Date: 05/28/2022 Prepared by: Dorene Grebe  Exercises - Seated Long Arc Quad  - 2 x daily - 7 x weekly - 1 sets - 20 reps - Seated March  - 2 x daily - 7 x weekly - 1 sets - 20 reps - Seated Heel Toe Raises  - 2 x daily - 7 x weekly - 1 sets - 20 reps - Side Stepping with Counter Support  - 2 x daily - 7 x weekly - 1 sets - 20 reps - Sit to Stand with Counter Support  - 2 x daily - 7 x weekly - 1 sets - 10 reps  ASSESSMENT:  CLINICAL IMPRESSION: Continuing PT POC with focus on improving LBP, LE strength, and gait/balance. Pt continues to display improved safety/reduced falls risk using rollator benefiting from seat to improve independence with short community distances. Pt does however continue to demonstrate LE weakness and generally kyphotic gait due to significant LBP. Pt will benefit from skilled PT services to develop HEP to increase LE muscle strength/ upright posture to improve walking/ functional tasks.   OBJECTIVE IMPAIRMENTS: Abnormal gait, decreased activity tolerance, decreased balance, decreased  coordination, decreased endurance, decreased knowledge of use of DME, decreased mobility, difficulty walking, decreased ROM, decreased strength, decreased safety awareness, hypomobility, impaired flexibility, improper body mechanics, postural dysfunction, and pain.   ACTIVITY LIMITATIONS: carrying, lifting, bending, sitting, standing, squatting, sleeping, stairs, transfers, bed mobility, bathing, toileting, dressing, and locomotion level  PARTICIPATION LIMITATIONS: meal prep, cleaning, laundry, driving, shopping, and community activity  PERSONAL FACTORS: Fitness and Past/current experiences are also affecting patient's functional outcome.   REHAB POTENTIAL: Good  CLINICAL DECISION MAKING: Unstable/unpredictable  EVALUATION COMPLEXITY: High   GOALS: Goals reviewed with patient? Yes  SHORT TERM GOALS: Target date: 06/12/22  Pt. Will be able to tolerate standing 10 minutes with no increase c/o low back pain to improve household tasks/ ADL.   Baseline:  Limited standing tolerance (>8/10 pain in back/ R upper thigh) Goal status: INITIAL  LONG TERM GOALS: Target date: 07/10/22  Pt. Will increase FOTO to  54 to improve pain-free mobility.   Baseline: initial 45 Goal status: INITIAL  2.  Pt. Will complete Berg balance test and score >48/50 to decrease fall risk/ improve safety with daily activity/ walking.  Baseline: TBD Goal status: INITIAL  3.  Pt. Will report <5/10 low back/ R upper thigh pain while walking.  Baseline: >8/10 pain Goal status: INITIAL  4.  Pt. Able to ambulate with use of SPC and consistent 2-point gait pattern for 10 minutes with no increase c/o back pain.   Baseline: limited endurance/ flexed posture/ antalgic gait.  Goal status: INITIAL  PLAN:  PT FREQUENCY: 2x/week  PT DURATION: 8 weeks  PLANNED INTERVENTIONS: Therapeutic exercises, Therapeutic activity, Neuromuscular re-education, Balance training, Gait training, Patient/Family education, Self Care, Joint  mobilization, Stair training, Aquatic Therapy, Cryotherapy, Moist heat, Manual therapy, and Re-evaluation.  PLAN FOR NEXT SESSION: Supine lumbar/LE ROM and stretches.  Check on rollator use. CHECK STG  [name], PT, DPT # [phone] Physical Therapist- [facility]  06/18/2022, 6:31 PM

## 2022-06-20 ENCOUNTER — Encounter: Payer: Self-pay | Admitting: Physical Therapy

## 2022-06-20 ENCOUNTER — Ambulatory Visit: Payer: Medicaid Other | Admitting: Physical Therapy

## 2022-06-20 DIAGNOSIS — M5459 Other low back pain: Secondary | ICD-10-CM

## 2022-06-20 DIAGNOSIS — R293 Abnormal posture: Secondary | ICD-10-CM

## 2022-06-20 DIAGNOSIS — M6281 Muscle weakness (generalized): Secondary | ICD-10-CM

## 2022-06-20 DIAGNOSIS — R269 Unspecified abnormalities of gait and mobility: Secondary | ICD-10-CM

## 2022-06-20 NOTE — Therapy (Signed)
OUTPATIENT PHYSICAL THERAPY THORACOLUMBAR TREATMENT  Patient Name: Lindsey Chavez MRN: [id number] DOB:1970-07-05, 52 y.o., female Today's Date: 06/20/2022  END OF SESSION:  PT End of Session - 06/20/22 0755     Visit Number 8    Number of Visits 16    Date for PT Re-Evaluation 07/10/22    PT Start Time 0808    PT Stop Time 0903    PT Time Calculation (min) 55 min    Activity Tolerance Patient tolerated treatment well    Behavior During Therapy [facility] for tasks assessed/performed             Past Medical History:  Diagnosis Date   Asthma    Depressive disorder    Past Surgical History:  Procedure Laterality Date   MOUTH SURGERY     TUBAL LIGATION     Patient Active Problem List   Diagnosis Date Noted   Lymphedema 12/19/2017   Pain in both lower extremities 08/19/2017   Bilateral lower extremity edema 08/19/2017    PCP: Emogene Morgan, MD  REFERRING PROVIDER: Morene Crocker, MD  REFERRING DIAG: Other abnormalities of gait and mobility    Chronic low back pain  Rationale for Evaluation and Treatment: Rehabilitation  THERAPY DIAG:  Other low back pain  Abnormal posture  Muscle weakness (generalized)  Gait difficulty  ONSET DATE: Chronic  SUBJECTIVE:                                                                                                                                                                                           SUBJECTIVE STATEMENT: Pt. Reports chronic h/o low back pain due to scoliosis and bulging discs.  Pt. Has not worked since 2022 due to back pain and receives disability.  Pt. Reports no falls and no previous back surgeries.  Pt. Unable to drive due to back pain.  Pt. Takes Lyrica for pain and sleeps in recliner due to increase pain in bed.  Pt. Lives with 3 sons and requires SPC/ use of grab bar for safety.  Injection with Dr. Mariah Milling 6/24  PERTINENT HISTORY:  REFERRING PHYSICIAN: Aycock, Ngwe Achirimofo* PRIMARY CARE  PHYSICIAN: Aycock, Ngwe Achirimofor, MD  IMPRESSION/PLAN  Ms. Fougere is a 52 y.o. female presenting for evaluation of  NUMBNESS/ TINGLING/ BACK PAIN/ DIFFICULTY SLEEPING/ DEPRESSION  - Unchanged. - Patient with PMH of HTN, lymphedema, lumbar radiculopathy, with ongoing neuropathy pain. Taking Lyrica 25 mg twice daily and 100 mg at night -Labs from last visit showed A1c is in prediabetic range. Alk phos also slightly elevated. Follow up with PCP. -Reviewed note from vascular recommending compression hose, elevating lower  extremities, consider lymph follow-up, recommended ultrasound lower extremities. - Provided prescription for 3 point cane -Referral to gait and balance physical therapy. -Change Lyrica to 25 mg in the morning, 25 mg in the afternoon and 100 mg at night.  -Continue to stay physically active. -Can start vitamin D 1000 units once daily. -Referral for sleep study to evaluate daytime fatigue. -Continue following with vascular. Continue wearing compression hose and elevating legs. -Alpha-lipoic acid is a naturally occurring, biological antioxidant, which might help with many chronic diseases, mostly diabetic peripheral neuropathy but can help other types of neuropathies as well. Usually given 600 mg/day, may take 3-5 wks before some symptomatic relief. Diabetic patients may need to reduce their diabetic meds. It may exacerbate thiamine deficiency in pt's with chronic malnutrition e.g. alcoholism  Follow up in 2-76mo  Medications previously tried:  CHIEF COMPLAINT & HPI  [name] is a 52 y.o. female presenting for evaluation of: Chief Complaint  Patient presents with  Numbness  Tingling  Back Pain  Sleeping Problem  Depression   NUMBNESS/ TINGLING/ BACK PAIN/ DIFFICULTY SLEEPING/ DEPRESSION  Patient reports with ongoing numbness and tingling in bilateral legs. Notes upcoming [place] LE in April per vascular recommendations. Following visit with vascular specialist, she was  recommended to wear compression hose throughout the day and remove them before bed and to continue to elevate bilateral legs. Notes ongoing swelling in bilateral legs. States she continues to have more frequent numbness, tingling and pain in right leg.Taking Lyrica to 25 mg in the morning, 25 mg in the afternoon and 100 mg at night. He is tolerating this well. Feels this is helpful with symptoms. States continuing to see physiatry for injections in spine. Notes reducing salt intake and getting daily exercise. Notes she spoke with nutritionist and is working on weight loss. Notes ongoing difficulty falling asleep and staying asleep. Reports getting 5 hours of sleep at night. States occasional drowsiness during the day. Notes ongoing appointment with therapist. Reports current mood is stable but ongoing depression. Endorses fatigue and low energy. No recent falls but feels that balance is not doing well. Needs new prescription for cane.  DATA SUMMARY: 12/05/2021 EMG LOWERS  Impression: Abnormal study. 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   05/17/2021 MRI LUMBAR SPINE WO CONTRAST  IMPRESSION:  Lower lumbar degenerative changes as detailed above. Facet  arthropathy has progressed. No new or progressive stenosis.   VISIT SUMMARIES:  MEDICATIONS Outpatient Medications Marked as Taking for the 02/20/22 encounter (Office Visit) with Gelene Mink, PA  Medication Sig  albuterol 90 mcg/actuation inhaler Inhale 2 inhalations into the lungs every 4 (four) hours as needed for Wheezing or Shortness of Breath  ARIPiprazole (ABILIFY) 10 MG tablet Take 10 mg by mouth once daily  busPIRone (BUSPAR) 10 MG tablet Take 10 mg by mouth 3 (three) times daily  diazePAM (VALIUM) 5 MG tablet 1-2 30 minutes before ESI  methocarbamoL (ROBAXIN) 500 MG tablet Take 500 mg by mouth 4 (four) times daily  pregabalin (LYRICA) 25 MG capsule Take 25  mg in the morning 25 mg in the afternoon and 100 mg at night  risperiDONE (RISPERDAL) 1 MG tablet Take 2 mg by mouth nightly   ALLERGIES Allergies  Allergen Reactions  Pork/Porcine Containing Products Hives   EXAM   Vitals:  02/20/22 0902  BP: 108/77  Pulse: 72  Weight: (!) 118.8 kg (262 lb)  PainSc: 7  PainLoc: Leg   Body  mass index is 39.84 kg/m.  GENERAL: Pleasant female, no distress. Normocephalic and atraumatic.  Baseline neurological exam below was obtained at prior office visit. Changes from today's visit appear in bold.   EYES: PERRLA EOM's intact  MUSCULOSKELETAL: Bulk - Normal Tone - Normal Pronator Drift - Absent bilaterally. Ambulation - Gait and station is slow, antalgic, limping.  Romberg - absent  R/L 5/5 Shoulder abduction (deltoid/supraspinatus, axillary/suprascapular n, C5) 5/5 Elbow flexion (biceps brachii, musculoskeletal n, C5-6) 5/5 Elbow extension (triceps, radial n, C7) 5/5 Finger adduction (interossei, ulnar n, T1)  4/5 Hip flexion (iliopsoas, L1/L2) 4/5 Knee flexion (hamstrings, sciatic n, L5/S1)  4/5 Knee extension (quadriceps, femoral n, L3/4) 5/5 Ankle dorsiflexion (tibialis anterior, deep fibular n, L4/5) 5/5 Ankle plantarflexion (gastroc, tibial n, S1)  Exam limited by pain.   NEUROLOGICAL: MENTAL STATUS: Patient is oriented to person, place and time.  Short-term memory is intact Long-term memory is intact.  Attention span and concentration are intact.  Naming and repetition are intact. Comprehension is intact.  Expressive speech is intact.  Patient's fund of knowledge is within normal limits for educational level.  CRANIAL NERVES: Visual acuity and visual fields are intact  Extraocular muscles are intact  Facial sensation is intact bilaterally  Facial strength is intact bilaterally  Hearing is intact bilaterally  Palate elevates midline, normal phonation  Shoulder shrug strength is intact  Tongue protrudes midline    SENSATION: Pain and temperature (spinothalamic tracts) is normal. Position and vibration (dorsal columns) is normal.  COORDINATION/CEREBELLAR: Finger to nose testing is intact.  REFLEXES:  Negative Hoffman's sign bilaterally.   PAST MEDICAL HISTORY Past Medical History:  Diagnosis Date  Asthma, unspecified asthma severity, unspecified whether complicated, unspecified whether persistent  Chest pain  Depression  ILD (interstitial lung disease) (CMS-HCC)  Major depressive disorder  Sickle cell trait (CMS-HCC)   PAST SURGICAL HISTORY Past Surgical History:  Procedure Laterality Date  dental surgery  LAPAROSCOPIC TUBAL LIGATION   PAIN:  Are you having pain? Yes: NPRS scale: 8/10 Pain location: R upper thigh Pain description: persistent Aggravating factors: standing/ walking Relieving factors: recliner/ medications  PRECAUTIONS: None  WEIGHT BEARING RESTRICTIONS: No  FALLS:  Has patient fallen in last 6 months? No  LIVING ENVIRONMENT: Lives with: lives with their family and lives with an adult companion Lives in: House/apartment Stairs: No Has following equipment at home: Single point cane, Tour manager, and Grab bars  OCCUPATION: Disability  PLOF: Independent  PATIENT GOALS: Improve walking/ safety.  Decrease low back pain.    NEXT MD VISIT: 06/08/22  OBJECTIVE:   DIAGNOSTIC FINDINGS:  CLINICAL DATA:  Chronic low back pain, unspecified laterality; right lumbar radiculopathy; spinal stenosis of lumbosacral region   EXAM: MRI LUMBAR SPINE WITHOUT CONTRAST   TECHNIQUE: Multiplanar, multisequence MR imaging of the lumbar spine was performed. No intravenous contrast was administered.   COMPARISON:  03/04/2018   FINDINGS: Segmentation:  Standard.   Alignment: Stable. Mild levocurvature. Trace anterolisthesis at L4-L5.   Vertebrae: Vertebral body heights are maintained. Mild degenerative marrow edema at L5-S1 eccentric to the left.   Conus  medullaris and cauda equina: Conus extends to the L2-L3 level. Conus and cauda equina appear normal.   Paraspinal and other soft tissues: Unremarkable.   Disc levels:   L1-L2:  No canal or foraminal stenosis.   L2-L3:  No canal or foraminal stenosis.   L3-L4:  Minor facet arthropathy.  No canal or foraminal stenosis.   L4-L5: Disc desiccation. Anterolisthesis with uncovering of disc.  Right foraminal protrusion. Moderate facet arthropathy with ligamentum flavum infolding. No canal or left foraminal stenosis. Minor right foraminal stenosis. Disc is in proximity to but does not definitely compress the exiting L4 nerve root.   L5-S1: Disc desiccation. Mild right and moderate left facet arthropathy. No canal or foraminal stenosis.   IMPRESSION: Lower lumbar degenerative changes as detailed above. Facet arthropathy has progressed. No new or progressive stenosis.     Electronically Signed   By: Guadlupe Spanish M.D.   On: 05/17/2021 13:59  PATIENT SURVEYS:  FOTO initial 45/ goal 54  SCREENING FOR RED FLAGS: Bowel or bladder incontinence: No Spinal tumors: No Cauda equina syndrome: No Compression fracture: No Abdominal aneurysm: No  COGNITION: Overall cognitive status: Within functional limits for tasks assessed     SENSATION: WFL  MUSCLE LENGTH: Hamstrings: Right 60 deg; Left 60 deg  (severe pain reported) Maisie Fus test: NT  (pain getting into position)  POSTURE: rounded shoulders, forward head, and flexed trunk   PALPATION: Generalized lumbar hypomobility/ tenderness with palpation.  LUMBAR ROM:   Stands in flexed posture (25 deg.)- pain limited with correction.  Limited/ poor standing tolerance and required 2 seated rest breaks during lumbar testing.   AROM eval  Flexion 25% limited   Extension 100% limited (pain)  Right lateral flexion 50% limited (pain)  Left lateral flexion 50% limited (pain)  Right rotation 50% limited (pain)  Left rotation 50% limited  (pain)   (Blank rows = not tested)  LOWER EXTREMITY ROM:      B LE AROM WFL except hip extension (pain)  LOWER EXTREMITY MMT:    MMT Right eval Left eval  Hip flexion 3+ (pain) 3+  Hip extension Unable to test Unable to test  Hip abduction 3 3+  Hip adduction 4 4  Hip internal rotation 4 4  Hip external rotation 4 4  Knee flexion 4 (LBP) 4  Knee extension 4 (LBP) 4  Ankle dorsiflexion    Ankle plantarflexion    Ankle inversion    Ankle eversion     (Blank rows = not tested)  LUMBAR SPECIAL TESTS:  Straight leg raise test: Positive and FABER test: Positive  FUNCTIONAL TESTS:  5 times sit to stand: TBD Berg Balance Scale: TBD  GAIT: Distance walked: in clinic Assistive device utilized: Single point cane  PT discussed use of rollator/ demonstrates Level of assistance: Modified independence Comments: Marked antalgic gait with limited hip/knee flexion and heavy B UE assist with SPC or rollator.  Poor endurance.  R LE muscle weakness/ fatigue reported.   TODAY'S TREATMENT:                                                                                                                              DATE: 06/20/2022  Subjective:  Pt reports persistent 6-7/10 LBP (R mid-low back pain with radicular symptoms to behind R knee) with standing/walking.  Pt. Entered PT with use of SPC and  still waiting on rollator.  Pt. Planning to go with family to [place] this weekend.  Pt. Is renting a scooter while at [place] so she can get around.    MH to low back in sitting prior to tx. Session (waiting room).    There.ex:  Nustep L4 10 min. B UE/LE for LE strengthening and gentle lumbar mobility- discussed use of rollator for improved safety/ current HEP.   12" step touches at stairs with required L UE assist on handrail (20x).    Reviewed HEP/ seated and supine ex.    Neuro Re-Ed:   Walking in hallway with rollator (156 feet)- requires seated rest break.    Amb. In //-bars  forward/backwards/lateral with use of mirror feedback for BOS/ step correction.    Sit to stand with no UE assist.  Posture education (tactile/verbal cuing).       PATIENT EDUCATION:  Education details: Use of rollator/ upright posture Person educated: Patient Education method: Medical illustrator Education comprehension: verbalized understanding, returned demonstration, and needs further education  HOME EXERCISE PROGRAM: Access Code: [email or web address] URL: [email or web address] Date: 05/28/2022 Prepared by: Dorene Grebe  Exercises - Seated Long Arc Quad  - 2 x daily - 7 x weekly - 1 sets - 20 reps - Seated March  - 2 x daily - 7 x weekly - 1 sets - 20 reps - Seated Heel Toe Raises  - 2 x daily - 7 x weekly - 1 sets - 20 reps - Side Stepping with Counter Support  - 2 x daily - 7 x weekly - 1 sets - 20 reps - Sit to Stand with Counter Support  - 2 x daily - 7 x weekly - 1 sets - 10 reps  ASSESSMENT:  CLINICAL IMPRESSION: Continuing PT POC with focus on improving LBP, LE strength, and gait/balance. Pt continues to display improved safety/reduced falls risk using rollator benefiting from seat to improve independence with short community distances. Pt does however continue to demonstrate LE weakness and generally kyphotic gait due to significant LBP. Pt will benefit from skilled PT services to develop HEP to increase LE muscle strength/ upright posture to improve walking/ functional tasks.   OBJECTIVE IMPAIRMENTS: Abnormal gait, decreased activity tolerance, decreased balance, decreased coordination, decreased endurance, decreased knowledge of use of DME, decreased mobility, difficulty walking, decreased ROM, decreased strength, decreased safety awareness, hypomobility, impaired flexibility, improper body mechanics, postural dysfunction, and pain.   ACTIVITY LIMITATIONS: carrying, lifting, bending, sitting, standing, squatting, sleeping, stairs, transfers, bed mobility,  bathing, toileting, dressing, and locomotion level  PARTICIPATION LIMITATIONS: meal prep, cleaning, laundry, driving, shopping, and community activity  PERSONAL FACTORS: Fitness and Past/current experiences are also affecting patient's functional outcome.   REHAB POTENTIAL: Good  CLINICAL DECISION MAKING: Unstable/unpredictable  EVALUATION COMPLEXITY: High   GOALS: Goals reviewed with patient? Yes  SHORT TERM GOALS: Target date: 06/12/22  Pt. Will be able to tolerate standing 10 minutes with no increase c/o low back pain to improve household tasks/ ADL.   Baseline:  Limited standing tolerance (>8/10 pain in back/ R upper thigh) Goal status: INITIAL  LONG TERM GOALS: Target date: 07/10/22  Pt. Will increase FOTO to 54 to improve pain-free mobility.   Baseline: initial 45 Goal status: INITIAL  2.  Pt. Will complete Berg balance test and score >48/50 to decrease fall risk/ improve safety with daily activity/ walking.  Baseline: TBD Goal status: INITIAL  3.  Pt. Will report <5/10 low back/ R upper thigh pain while walking.  Baseline: >  8/10 pain Goal status: INITIAL  4.  Pt. Able to ambulate with use of SPC and consistent 2-point gait pattern for 10 minutes with no increase c/o back pain.   Baseline: limited endurance/ flexed posture/ antalgic gait.  Goal status: INITIAL  PLAN:  PT FREQUENCY: 2x/week  PT DURATION: 8 weeks  PLANNED INTERVENTIONS: Therapeutic exercises, Therapeutic activity, Neuromuscular re-education, Balance training, Gait training, Patient/Family education, Self Care, Joint mobilization, Stair training, Aquatic Therapy, Cryotherapy, Moist heat, Manual therapy, and Re-evaluation.  PLAN FOR NEXT SESSION: Supine lumbar/LE ROM and stretches.  Check on rollator use. CHECK STG/ discuss [place] trip.  [name], PT, DPT # [phone] Physical Therapist- [facility]  06/20/2022, 12:19 PM

## 2022-06-25 ENCOUNTER — Ambulatory Visit: Payer: Medicaid Other

## 2022-06-25 ENCOUNTER — Encounter: Payer: Self-pay | Admitting: Physical Therapy

## 2022-06-25 DIAGNOSIS — R293 Abnormal posture: Secondary | ICD-10-CM

## 2022-06-25 DIAGNOSIS — M6281 Muscle weakness (generalized): Secondary | ICD-10-CM

## 2022-06-25 DIAGNOSIS — R269 Unspecified abnormalities of gait and mobility: Secondary | ICD-10-CM

## 2022-06-25 DIAGNOSIS — M5459 Other low back pain: Secondary | ICD-10-CM | POA: Diagnosis not present

## 2022-06-25 NOTE — Therapy (Signed)
OUTPATIENT PHYSICAL THERAPY THORACOLUMBAR TREATMENT  Patient Name: Lindsey Chavez MRN: [id number] DOB:January 21, 1970, 52 y.o., female Today's Date: 06/25/2022  END OF SESSION:  PT End of Session - 06/25/22 0747     Visit Number 9    Number of Visits 16    Date for PT Re-Evaluation 07/10/22    PT Start Time 0757    PT Stop Time 0843    PT Time Calculation (min) 46 min    Activity Tolerance Patient tolerated treatment well    Behavior During Therapy [facility] for tasks assessed/performed             Past Medical History:  Diagnosis Date   Asthma    Depressive disorder    Past Surgical History:  Procedure Laterality Date   MOUTH SURGERY     TUBAL LIGATION     Patient Active Problem List   Diagnosis Date Noted   Lymphedema 12/19/2017   Pain in both lower extremities 08/19/2017   Bilateral lower extremity edema 08/19/2017    PCP: Emogene Morgan, MD  REFERRING PROVIDER: Morene Crocker, MD  REFERRING DIAG: Other abnormalities of gait and mobility    Chronic low back pain  Rationale for Evaluation and Treatment: Rehabilitation  THERAPY DIAG:  Other low back pain  Abnormal posture  Muscle weakness (generalized)  Gait difficulty  ONSET DATE: Chronic  SUBJECTIVE:                                                                                                                                                                                           SUBJECTIVE STATEMENT: Pt. Reports chronic h/o low back pain due to scoliosis and bulging discs.  Pt. Has not worked since 2022 due to back pain and receives disability.  Pt. Reports no falls and no previous back surgeries.  Pt. Unable to drive due to back pain.  Pt. Takes Lyrica for pain and sleeps in recliner due to increase pain in bed.  Pt. Lives with 3 sons and requires SPC/ use of grab bar for safety.  Injection with Dr. Mariah Milling 6/24  PERTINENT HISTORY:  REFERRING PHYSICIAN: Aycock, Ngwe Achirimofo* PRIMARY CARE  PHYSICIAN: Aycock, Ngwe Achirimofor, MD  IMPRESSION/PLAN  Lindsey Chavez is a 52 y.o. female presenting for evaluation of  NUMBNESS/ TINGLING/ BACK PAIN/ DIFFICULTY SLEEPING/ DEPRESSION  - Unchanged. - Patient with PMH of HTN, lymphedema, lumbar radiculopathy, with ongoing neuropathy pain. Taking Lyrica 25 mg twice daily and 100 mg at night -Labs from last visit showed A1c is in prediabetic range. Alk phos also slightly elevated. Follow up with PCP. -Reviewed note from vascular recommending compression hose, elevating lower  extremities, consider lymph follow-up, recommended ultrasound lower extremities. - Provided prescription for 3 point cane -Referral to gait and balance physical therapy. -Change Lyrica to 25 mg in the morning, 25 mg in the afternoon and 100 mg at night.  -Continue to stay physically active. -Can start vitamin D 1000 units once daily. -Referral for sleep study to evaluate daytime fatigue. -Continue following with vascular. Continue wearing compression hose and elevating legs. -Alpha-lipoic acid is a naturally occurring, biological antioxidant, which might help with many chronic diseases, mostly diabetic peripheral neuropathy but can help other types of neuropathies as well. Usually given 600 mg/day, may take 3-5 wks before some symptomatic relief. Diabetic patients may need to reduce their diabetic meds. It may exacerbate thiamine deficiency in pt's with chronic malnutrition e.g. alcoholism  Follow up in 2-76mo  Medications previously tried:  CHIEF COMPLAINT & HPI  Lindsey Chavez is a 52 y.o. female presenting for evaluation of: Chief Complaint  Patient presents with  Numbness  Tingling  Back Pain  Sleeping Problem  Depression   NUMBNESS/ TINGLING/ BACK PAIN/ DIFFICULTY SLEEPING/ DEPRESSION  Patient reports with ongoing numbness and tingling in bilateral legs. Notes upcoming [place] LE in April per vascular recommendations. Following visit with vascular specialist, she was  recommended to wear compression hose throughout the day and remove them before bed and to continue to elevate bilateral legs. Notes ongoing swelling in bilateral legs. States she continues to have more frequent numbness, tingling and pain in right leg.Taking Lyrica to 25 mg in the morning, 25 mg in the afternoon and 100 mg at night. He is tolerating this well. Feels this is helpful with symptoms. States continuing to see physiatry for injections in spine. Notes reducing salt intake and getting daily exercise. Notes she spoke with nutritionist and is working on weight loss. Notes ongoing difficulty falling asleep and staying asleep. Reports getting 5 hours of sleep at night. States occasional drowsiness during the day. Notes ongoing appointment with therapist. Reports current mood is stable but ongoing depression. Endorses fatigue and low energy. No recent falls but feels that balance is not doing well. Needs new prescription for cane.  DATA SUMMARY: 12/05/2021 EMG LOWERS  Impression: Abnormal study. 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   05/17/2021 MRI LUMBAR SPINE WO CONTRAST  IMPRESSION:  Lower lumbar degenerative changes as detailed above. Facet  arthropathy has progressed. No new or progressive stenosis.   VISIT SUMMARIES:  MEDICATIONS Outpatient Medications Marked as Taking for the 02/20/22 encounter (Office Visit) with Gelene Mink, PA  Medication Sig  albuterol 90 mcg/actuation inhaler Inhale 2 inhalations into the lungs every 4 (four) hours as needed for Wheezing or Shortness of Breath  ARIPiprazole (ABILIFY) 10 MG tablet Take 10 mg by mouth once daily  busPIRone (BUSPAR) 10 MG tablet Take 10 mg by mouth 3 (three) times daily  diazePAM (VALIUM) 5 MG tablet 1-2 30 minutes before ESI  methocarbamoL (ROBAXIN) 500 MG tablet Take 500 mg by mouth 4 (four) times daily  pregabalin (LYRICA) 25 MG capsule Take 25  mg in the morning 25 mg in the afternoon and 100 mg at night  risperiDONE (RISPERDAL) 1 MG tablet Take 2 mg by mouth nightly   ALLERGIES Allergies  Allergen Reactions  Pork/Porcine Containing Products Hives   EXAM   Vitals:  02/20/22 0902  BP: 108/77  Pulse: 72  Weight: (!) 118.8 kg (262 lb)  PainSc: 7  PainLoc: Leg   Body  mass index is 39.84 kg/m.  GENERAL: Pleasant female, no distress. Normocephalic and atraumatic.  Baseline neurological exam below was obtained at prior office visit. Changes from today's visit appear in bold.   EYES: PERRLA EOM's intact  MUSCULOSKELETAL: Bulk - Normal Tone - Normal Pronator Drift - Absent bilaterally. Ambulation - Gait and station is slow, antalgic, limping.  Romberg - absent  R/L 5/5 Shoulder abduction (deltoid/supraspinatus, axillary/suprascapular n, C5) 5/5 Elbow flexion (biceps brachii, musculoskeletal n, C5-6) 5/5 Elbow extension (triceps, radial n, C7) 5/5 Finger adduction (interossei, ulnar n, T1)  4/5 Hip flexion (iliopsoas, L1/L2) 4/5 Knee flexion (hamstrings, sciatic n, L5/S1)  4/5 Knee extension (quadriceps, femoral n, L3/4) 5/5 Ankle dorsiflexion (tibialis anterior, deep fibular n, L4/5) 5/5 Ankle plantarflexion (gastroc, tibial n, S1)  Exam limited by pain.   NEUROLOGICAL: MENTAL STATUS: Patient is oriented to person, place and time.  Short-term memory is intact Long-term memory is intact.  Attention span and concentration are intact.  Naming and repetition are intact. Comprehension is intact.  Expressive speech is intact.  Patient's fund of knowledge is within normal limits for educational level.  CRANIAL NERVES: Visual acuity and visual fields are intact  Extraocular muscles are intact  Facial sensation is intact bilaterally  Facial strength is intact bilaterally  Hearing is intact bilaterally  Palate elevates midline, normal phonation  Shoulder shrug strength is intact  Tongue protrudes midline    SENSATION: Pain and temperature (spinothalamic tracts) is normal. Position and vibration (dorsal columns) is normal.  COORDINATION/CEREBELLAR: Finger to nose testing is intact.  REFLEXES:  Negative Hoffman's sign bilaterally.   PAST MEDICAL HISTORY Past Medical History:  Diagnosis Date  Asthma, unspecified asthma severity, unspecified whether complicated, unspecified whether persistent  Chest pain  Depression  ILD (interstitial lung disease) (CMS-HCC)  Major depressive disorder  Sickle cell trait (CMS-HCC)   PAST SURGICAL HISTORY Past Surgical History:  Procedure Laterality Date  dental surgery  LAPAROSCOPIC TUBAL LIGATION   PAIN:  Are you having pain? Yes: NPRS scale: 8/10 Pain location: R upper thigh Pain description: persistent Aggravating factors: standing/ walking Relieving factors: recliner/ medications  PRECAUTIONS: None  WEIGHT BEARING RESTRICTIONS: No  FALLS:  Has patient fallen in last 6 months? No  LIVING ENVIRONMENT: Lives with: lives with their family and lives with an adult companion Lives in: House/apartment Stairs: No Has following equipment at home: Single point cane, Tour manager, and Grab bars  OCCUPATION: Disability  PLOF: Independent  PATIENT GOALS: Improve walking/ safety.  Decrease low back pain.    NEXT MD VISIT: 06/08/22  OBJECTIVE:   DIAGNOSTIC FINDINGS:  CLINICAL DATA:  Chronic low back pain, unspecified laterality; right lumbar radiculopathy; spinal stenosis of lumbosacral region   EXAM: MRI LUMBAR SPINE WITHOUT CONTRAST   TECHNIQUE: Multiplanar, multisequence MR imaging of the lumbar spine was performed. No intravenous contrast was administered.   COMPARISON:  03/04/2018   FINDINGS: Segmentation:  Standard.   Alignment: Stable. Mild levocurvature. Trace anterolisthesis at L4-L5.   Vertebrae: Vertebral body heights are maintained. Mild degenerative marrow edema at L5-S1 eccentric to the left.   Conus  medullaris and cauda equina: Conus extends to the L2-L3 level. Conus and cauda equina appear normal.   Paraspinal and other soft tissues: Unremarkable.   Disc levels:   L1-L2:  No canal or foraminal stenosis.   L2-L3:  No canal or foraminal stenosis.   L3-L4:  Minor facet arthropathy.  No canal or foraminal stenosis.   L4-L5: Disc desiccation. Anterolisthesis with uncovering of disc.  Right foraminal protrusion. Moderate facet arthropathy with ligamentum flavum infolding. No canal or left foraminal stenosis. Minor right foraminal stenosis. Disc is in proximity to but does not definitely compress the exiting L4 nerve root.   L5-S1: Disc desiccation. Mild right and moderate left facet arthropathy. No canal or foraminal stenosis.   IMPRESSION: Lower lumbar degenerative changes as detailed above. Facet arthropathy has progressed. No new or progressive stenosis.     Electronically Signed   By: Guadlupe Spanish M.D.   On: 05/17/2021 13:59  PATIENT SURVEYS:  FOTO initial 45/ goal 54  SCREENING FOR RED FLAGS: Bowel or bladder incontinence: No Spinal tumors: No Cauda equina syndrome: No Compression fracture: No Abdominal aneurysm: No  COGNITION: Overall cognitive status: Within functional limits for tasks assessed     SENSATION: WFL  MUSCLE LENGTH: Hamstrings: Right 60 deg; Left 60 deg  (severe pain reported) Maisie Fus test: NT  (pain getting into position)  POSTURE: rounded shoulders, forward head, and flexed trunk   PALPATION: Generalized lumbar hypomobility/ tenderness with palpation.  LUMBAR ROM:   Stands in flexed posture (25 deg.)- pain limited with correction.  Limited/ poor standing tolerance and required 2 seated rest breaks during lumbar testing.   AROM eval  Flexion 25% limited   Extension 100% limited (pain)  Right lateral flexion 50% limited (pain)  Left lateral flexion 50% limited (pain)  Right rotation 50% limited (pain)  Left rotation 50% limited  (pain)   (Blank rows = not tested)  LOWER EXTREMITY ROM:      B LE AROM WFL except hip extension (pain)  LOWER EXTREMITY MMT:    MMT Right eval Left eval  Hip flexion 3+ (pain) 3+  Hip extension Unable to test Unable to test  Hip abduction 3 3+  Hip adduction 4 4  Hip internal rotation 4 4  Hip external rotation 4 4  Knee flexion 4 (LBP) 4  Knee extension 4 (LBP) 4  Ankle dorsiflexion    Ankle plantarflexion    Ankle inversion    Ankle eversion     (Blank rows = not tested)  LUMBAR SPECIAL TESTS:  Straight leg raise test: Positive and FABER test: Positive  FUNCTIONAL TESTS:  5 times sit to stand: TBD Berg Balance Scale: TBD  GAIT: Distance walked: in clinic Assistive device utilized: Single point cane  PT discussed use of rollator/ demonstrates Level of assistance: Modified independence Comments: Marked antalgic gait with limited hip/knee flexion and heavy B UE assist with SPC or rollator.  Poor endurance.  R LE muscle weakness/ fatigue reported.   TODAY'S TREATMENT:                                                                                                                              DATE: 06/25/2022  Subjective: Patient complaining of sciatic pain in R LE (7/10). Patient reports she went to [place] over the weekend and rented a scooter which was helpful and felt like  she was able to be a part of the trip with rest of her family.    MH to low back while in supine during stretches     There.ex:  Nustep L4 10 min. B UE/LE for LE strengthening and gentle lumbar mobility- discussed use of rollator for improved safety/ current HEP.   Seated lumbar neural glides x 10 with 5 second hold    In supine, general stretching (hamstrings/piriformis/trunk rotation) x 15 minutes   Seated LAQ x 20 each LE with 3# AW Seated paloff press x 10 anti-rotation each side, cues for abdominal activation   Neuro Re-Ed:   Walking in hallway with rollator (156 feet x 2)- requires  seated rest break. Cues for upright posture and increasing step length     PATIENT EDUCATION:  Education details: Use of rollator/ upright posture Person educated: Patient Education method: Medical illustrator Education comprehension: verbalized understanding, returned demonstration, and needs further education  HOME EXERCISE PROGRAM: Access Code: [email or web address] URL: [email or web address] Date: 05/28/2022 Prepared by: Dorene Grebe  Exercises - Seated Long Arc Quad  - 2 x daily - 7 x weekly - 1 sets - 20 reps - Seated March  - 2 x daily - 7 x weekly - 1 sets - 20 reps - Seated Heel Toe Raises  - 2 x daily - 7 x weekly - 1 sets - 20 reps - Side Stepping with Counter Support  - 2 x daily - 7 x weekly - 1 sets - 20 reps - Sit to Stand with Counter Support  - 2 x daily - 7 x weekly - 1 sets - 10 reps  ASSESSMENT:  CLINICAL IMPRESSION:   Patient arrives to session with use of SPC and motivated to participate. Complaining of increased pain in low back with radiating pain and numbness to R foot. Session focused on stretching, gait with rollator, and light LE/core strengthening in sitting. No improvement in LBP (7/10) throughout session despite heat application and stretching. Patient will continue to benefit from skilled therapy to address remaining deficits in order to improve quality of life and return to PLOF.     OBJECTIVE IMPAIRMENTS: Abnormal gait, decreased activity tolerance, decreased balance, decreased coordination, decreased endurance, decreased knowledge of use of DME, decreased mobility, difficulty walking, decreased ROM, decreased strength, decreased safety awareness, hypomobility, impaired flexibility, improper body mechanics, postural dysfunction, and pain.   ACTIVITY LIMITATIONS: carrying, lifting, bending, sitting, standing, squatting, sleeping, stairs, transfers, bed mobility, bathing, toileting, dressing, and locomotion level  PARTICIPATION LIMITATIONS:  meal prep, cleaning, laundry, driving, shopping, and community activity  PERSONAL FACTORS: Fitness and Past/current experiences are also affecting patient's functional outcome.   REHAB POTENTIAL: Good  CLINICAL DECISION MAKING: Unstable/unpredictable  EVALUATION COMPLEXITY: High   GOALS: Goals reviewed with patient? Yes  SHORT TERM GOALS: Target date: 06/12/22  Pt. Will be able to tolerate standing 10 minutes with no increase c/o low back pain to improve household tasks/ ADL.   Baseline:  Limited standing tolerance (>8/10 pain in back/ R upper thigh) Goal status: INITIAL  LONG TERM GOALS: Target date: 07/10/22  Pt. Will increase FOTO to 54 to improve pain-free mobility.   Baseline: initial 45 Goal status: INITIAL  2.  Pt. Will complete Berg balance test and score >48/50 to decrease fall risk/ improve safety with daily activity/ walking.  Baseline: TBD Goal status: INITIAL  3.  Pt. Will report <5/10 low back/ R upper thigh pain while walking.  Baseline: >8/10 pain Goal status: INITIAL  4.  Pt. Able to ambulate with use of SPC and consistent 2-point gait pattern for 10 minutes with no increase c/o back pain.   Baseline: limited endurance/ flexed posture/ antalgic gait.  Goal status: INITIAL  PLAN:  PT FREQUENCY: 2x/week  PT DURATION: 8 weeks  PLANNED INTERVENTIONS: Therapeutic exercises, Therapeutic activity, Neuromuscular re-education, Balance training, Gait training, Patient/Family education, Self Care, Joint mobilization, Stair training, Aquatic Therapy, Cryotherapy, Moist heat, Manual therapy, and Re-evaluation.  PLAN FOR NEXT SESSION: Supine lumbar/LE ROM and stretches.  Check on rollator use. CHECK STG  Maylon Peppers, PT, DPT Physical Therapist - [facility]  [facility]   06/25/2022, 8:53 AM

## 2022-06-27 ENCOUNTER — Ambulatory Visit: Payer: Medicaid Other

## 2022-06-27 DIAGNOSIS — M5459 Other low back pain: Secondary | ICD-10-CM | POA: Diagnosis not present

## 2022-06-27 DIAGNOSIS — M6281 Muscle weakness (generalized): Secondary | ICD-10-CM

## 2022-06-27 DIAGNOSIS — R269 Unspecified abnormalities of gait and mobility: Secondary | ICD-10-CM

## 2022-06-27 DIAGNOSIS — R293 Abnormal posture: Secondary | ICD-10-CM

## 2022-06-27 NOTE — Therapy (Addendum)
OUTPATIENT PHYSICAL THERAPY TREATMENT Physical Therapy Progress Note Dates of reporting period  05/15/22   to   06/27/22   Patient Name: Lindsey Chavez MRN: [id number] DOB:Sep 20, 1970, 52 y.o., female Today's Date: 06/27/2022  END OF SESSION:  PT End of Session - 06/27/22 0730     Visit Number 10    Number of Visits 16    Date for PT Re-Evaluation 07/10/22    Authorization Type [place] [employer]- [employer]    Authorization Time Period 05/15/22-07/10/22    Authorization - Visit Number 9    Authorization - Number of Visits 27    Progress Note Due on Visit 10    PT Start Time 0727    PT Stop Time 0807    PT Time Calculation (min) 40 min    Activity Tolerance Patient tolerated treatment well;No increased pain;Patient limited by fatigue    Behavior During Therapy [facility] for tasks assessed/performed             Past Medical History:  Diagnosis Date   Asthma    Depressive disorder    Past Surgical History:  Procedure Laterality Date   MOUTH SURGERY     TUBAL LIGATION     Patient Active Problem List   Diagnosis Date Noted   Lymphedema 12/19/2017   Pain in both lower extremities 08/19/2017   Bilateral lower extremity edema 08/19/2017    PCP: Emogene Morgan, MD  REFERRING PROVIDER: Morene Crocker, MD  REFERRING DIAG: Other abnormalities of gait and mobility    Chronic low back pain  Rationale for Evaluation and Treatment: Rehabilitation  THERAPY DIAG:  Other low back pain  Abnormal posture  Muscle weakness (generalized)  Gait difficulty  ONSET DATE: Chronic  SUBJECTIVE:                                                                                                                                                                                           SUBJECTIVE STATEMENT: Pt. Reports chronic h/o low back pain due to scoliosis and bulging discs.  Pt. Has not worked since 2022 due to back pain and receives disability.  Pt. Reports no falls and no previous  back surgeries.  Pt unable to drive due to back pain.  Pt. Takes Lyrica for pain and sleeps in recliner due to increase pain in bed.  Pt. Lives with 3 sons and requires SPC/ use of grab bar for safety.  Injection with Dr. Mariah Milling 6/24.   Subjective: 7/10 pain in 'spine' and Right leg. Pt still waiting on her rollator to come in.    TODAY'S TREATMENT:  DATE: 06/27/2022    FOTO survey: 23/100  AA/ROM on Nustep level 4 x 10 minutes, avg SPM ~18 Seated LAQ x 20 each LE with 3# AW bilat  Seated marching x20 LE with 3# AW bilat  STS from elevated EOB, hands ad lib x8  Seated trunk rotation c silver physioball 1x20, alternating sides  Side stepping in // bars 5RT, hands free, no LOB   PATIENT EDUCATION:  Education details: Use of rollator/ upright posture Person educated: Patient Education method: Medical illustrator Education comprehension: verbalized understanding, returned demonstration, and needs further education  HOME EXERCISE PROGRAM: Access Code: [email or web address] URL: [email or web address] Date: 05/28/2022 Prepared by: Dorene Grebe  Exercises - Seated Long Arc Quad  - 2 x daily - 7 x weekly - 1 sets - 20 reps - Seated March  - 2 x daily - 7 x weekly - 1 sets - 20 reps - Seated Heel Toe Raises  - 2 x daily - 7 x weekly - 1 sets - 20 reps - Side Stepping with Counter Support  - 2 x daily - 7 x weekly - 1 sets - 20 reps - Sit to Stand with Counter Support  - 2 x daily - 7 x weekly - 1 sets - 10 reps  ASSESSMENT:  CLINICAL IMPRESSION:   Continued with current POC as previously established. No significant improvements as of yet since starting therapy. Pt continues to demonstrate poor activity tolerance overall, but remain very much motivated to advance her progress and continue to participate in social and family activities. Pt given recovery  intervals for fatigue, but mostly to avoid pain exacerbation. Will continue to work toward goals of care.    OBJECTIVE IMPAIRMENTS: Abnormal gait, decreased activity tolerance, decreased balance, decreased coordination, decreased endurance, decreased knowledge of use of DME, decreased mobility, difficulty walking, decreased ROM, decreased strength, decreased safety awareness, hypomobility, impaired flexibility, improper body mechanics, postural dysfunction, and pain.   ACTIVITY LIMITATIONS: carrying, lifting, bending, sitting, standing, squatting, sleeping, stairs, transfers, bed mobility, bathing, toileting, dressing, and locomotion level  PARTICIPATION LIMITATIONS: meal prep, cleaning, laundry, driving, shopping, and community activity  PERSONAL FACTORS: Fitness and Past/current experiences are also affecting patient's functional outcome.   REHAB POTENTIAL: Good  CLINICAL DECISION MAKING: Unstable/unpredictable  EVALUATION COMPLEXITY: High   GOALS: Goals reviewed with patient? Yes  SHORT TERM GOALS: Target date: 06/12/22  Pt. Will be able to tolerate standing 10 minutes with no increase c/o low back pain to improve household tasks/ ADL.   Baseline:  Limited standing tolerance (>8/10 pain in back/ R upper thigh) Goal status: Progressing   LONG TERM GOALS: Target date: 07/10/22  Pt. Will increase FOTO to 54 to improve pain-free mobility.   Baseline: initial 45; 06/27/22: 23 Goal status: Progressing   2.  Pt. Will complete Berg balance test and score >48/50 to decrease fall risk/ improve safety with daily activity/ walking.  Baseline: TBD Goal status: INITIAL  3.  Pt. Will report <5/10 low back/ R upper thigh pain while walking.  Baseline: >8/10 pain Goal status: INITIAL  4.  Pt. Able to ambulate with use of SPC and consistent 2-point gait pattern for 10 minutes with no increase c/o back pain.   Baseline: limited endurance/ flexed posture/ antalgic gait.  Goal status:  INITIAL  PLAN:  PT FREQUENCY: 2x/week  PT DURATION: 8 weeks  PLANNED INTERVENTIONS: Therapeutic exercises, Therapeutic activity, Neuromuscular re-education, Balance training, Gait training, Patient/Family education, Self Care, Joint mobilization, Stair training, Aquatic Therapy, Cryotherapy, Moist heat,  Manual therapy, and Re-evaluation.  PLAN FOR NEXT SESSION: Supine lumbar/LE ROM and stretches.    7:34 AM, 06/27/22 Rosamaria Lints, PT, DPT Physical Therapist - [facility] in [place]  [phone] (Office)      06/27/2022, 7:34 AM

## 2022-07-02 ENCOUNTER — Ambulatory Visit: Payer: Medicaid Other | Admitting: Physical Therapy

## 2022-07-04 ENCOUNTER — Ambulatory Visit: Payer: Medicaid Other

## 2022-07-04 DIAGNOSIS — R269 Unspecified abnormalities of gait and mobility: Secondary | ICD-10-CM

## 2022-07-04 DIAGNOSIS — R293 Abnormal posture: Secondary | ICD-10-CM

## 2022-07-04 DIAGNOSIS — M6281 Muscle weakness (generalized): Secondary | ICD-10-CM

## 2022-07-04 DIAGNOSIS — M5459 Other low back pain: Secondary | ICD-10-CM

## 2022-07-04 NOTE — Therapy (Signed)
OUTPATIENT PHYSICAL THERAPY THORACOLUMBAR TREATMENT  Patient Name: Lindsey Chavez MRN: [id number] DOB:[birth year], 52 y.o., female Today's Date: 07/04/2022  END OF SESSION:  PT End of Session - 07/04/22 0816     Visit Number 11    Number of Visits 16    Date for PT Re-Evaluation 07/10/22    Authorization Type [place] Medicaid- [employer]    Authorization Time Period 05/15/22-07/10/22    Authorization - Number of Visits 27    Progress Note Due on Visit 10    PT Start Time 0816    PT Stop Time 0859    PT Time Calculation (min) 43 min    Activity Tolerance Patient tolerated treatment well;No increased pain;Patient limited by fatigue    Behavior During Therapy [facility] for tasks assessed/performed             Past Medical History:  Diagnosis Date   Asthma    Depressive disorder    Past Surgical History:  Procedure Laterality Date   MOUTH SURGERY     TUBAL LIGATION     Patient Active Problem List   Diagnosis Date Noted   Lymphedema 12/19/2017   Pain in both lower extremities 08/19/2017   Bilateral lower extremity edema 08/19/2017    PCP: Emogene Morgan, MD  REFERRING PROVIDER: Morene Crocker, MD  REFERRING DIAG: Other abnormalities of gait and mobility    Chronic low back pain  Rationale for Evaluation and Treatment: Rehabilitation  THERAPY DIAG:  Other low back pain  Abnormal posture  Muscle weakness (generalized)  Gait difficulty  ONSET DATE: Chronic  SUBJECTIVE:                                                                                                                                                                                           SUBJECTIVE STATEMENT: Pt. Reports chronic h/o low back pain due to scoliosis and bulging discs.  Pt. Has not worked since 2022 due to back pain and receives disability.  Pt. Reports no falls and no previous back surgeries.  Pt. Unable to drive due to back pain.  Pt. Takes Lyrica for pain and sleeps in recliner  due to increase pain in bed.  Pt. Lives with 3 sons and requires SPC/ use of grab bar for safety.  Injection with Dr. Mariah Milling 6/24  PERTINENT HISTORY:  REFERRING PHYSICIAN: Aycock, Ngwe Achirimofo* PRIMARY CARE PHYSICIAN: Aycock, Ngwe Achirimofor, MD  IMPRESSION/PLAN  Ms. Dietz is a 52 y.o. female presenting for evaluation of  NUMBNESS/ TINGLING/ BACK PAIN/ DIFFICULTY SLEEPING/ DEPRESSION  - Unchanged. - Patient with PMH of HTN, lymphedema, lumbar radiculopathy, with ongoing neuropathy pain.  Taking Lyrica 25 mg twice daily and 100 mg at night -Labs from last visit showed A1c is in prediabetic range. Alk phos also slightly elevated. Follow up with PCP. -Reviewed note from vascular recommending compression hose, elevating lower extremities, consider lymph follow-up, recommended ultrasound lower extremities. - Provided prescription for 3 point cane -Referral to gait and balance physical therapy. -Change Lyrica to 25 mg in the morning, 25 mg in the afternoon and 100 mg at night.  -Continue to stay physically active. -Can start vitamin D 1000 units once daily. -Referral for sleep study to evaluate daytime fatigue. -Continue following with vascular. Continue wearing compression hose and elevating legs. -Alpha-lipoic acid is a naturally occurring, biological antioxidant, which might help with many chronic diseases, mostly diabetic peripheral neuropathy but can help other types of neuropathies as well. Usually given 600 mg/day, may take 3-5 wks before some symptomatic relief. Diabetic patients may need to reduce their diabetic meds. It may exacerbate thiamine deficiency in pt's with chronic malnutrition e.g. alcoholism  Follow up in 2-43mo  Medications previously tried:  CHIEF COMPLAINT & HPI  Ms. Fakhouri is a 52 y.o. female presenting for evaluation of: Chief Complaint  Patient presents with  Numbness  Tingling  Back Pain  Sleeping Problem  Depression   NUMBNESS/ TINGLING/ BACK PAIN/  DIFFICULTY SLEEPING/ DEPRESSION  Patient reports with ongoing numbness and tingling in bilateral legs. Notes upcoming [place] LE in April per vascular recommendations. Following visit with vascular specialist, she was recommended to wear compression hose throughout the day and remove them before bed and to continue to elevate bilateral legs. Notes ongoing swelling in bilateral legs. States she continues to have more frequent numbness, tingling and pain in right leg.Taking Lyrica to 25 mg in the morning, 25 mg in the afternoon and 100 mg at night. He is tolerating this well. Feels this is helpful with symptoms. States continuing to see physiatry for injections in spine. Notes reducing salt intake and getting daily exercise. Notes she spoke with nutritionist and is working on weight loss. Notes ongoing difficulty falling asleep and staying asleep. Reports getting 5 hours of sleep at night. States occasional drowsiness during the day. Notes ongoing appointment with therapist. Reports current mood is stable but ongoing depression. Endorses fatigue and low energy. No recent falls but feels that balance is not doing well. Needs new prescription for cane.  DATA SUMMARY: 12/05/2021 EMG LOWERS  Impression: Abnormal study. 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   05/17/2021 MRI LUMBAR SPINE WO CONTRAST  IMPRESSION:  Lower lumbar degenerative changes as detailed above. Facet  arthropathy has progressed. No new or progressive stenosis.   VISIT SUMMARIES:  MEDICATIONS Outpatient Medications Marked as Taking for the 02/20/22 encounter (Office Visit) with Gelene Mink, PA  Medication Sig  albuterol 90 mcg/actuation inhaler Inhale 2 inhalations into the lungs every 4 (four) hours as needed for Wheezing or Shortness of Breath  ARIPiprazole (ABILIFY) 10 MG tablet Take 10 mg by mouth once daily  busPIRone (BUSPAR) 10 MG tablet Take 10 mg  by mouth 3 (three) times daily  diazePAM (VALIUM) 5 MG tablet 1-2 30 minutes before ESI  methocarbamoL (ROBAXIN) 500 MG tablet Take 500 mg by mouth 4 (four) times daily  pregabalin (LYRICA) 25 MG capsule Take 25 mg in the morning 25 mg in the afternoon and 100 mg at night  risperiDONE (RISPERDAL) 1 MG tablet Take 2 mg by mouth nightly   ALLERGIES Allergies  Allergen Reactions  Pork/Porcine Containing Products Hives   EXAM   Vitals:  02/20/22 0902  BP: 108/77  Pulse: 72  Weight: (!) 118.8 kg (262 lb)  PainSc: 7  PainLoc: Leg   Body mass index is 39.84 kg/m.  GENERAL: Pleasant female, no distress. Normocephalic and atraumatic.  Baseline neurological exam below was obtained at prior office visit. Changes from today's visit appear in bold.   EYES: PERRLA EOM's intact  MUSCULOSKELETAL: Bulk - Normal Tone - Normal Pronator Drift - Absent bilaterally. Ambulation - Gait and station is slow, antalgic, limping.  Romberg - absent  R/L 5/5 Shoulder abduction (deltoid/supraspinatus, axillary/suprascapular n, C5) 5/5 Elbow flexion (biceps brachii, musculoskeletal n, C5-6) 5/5 Elbow extension (triceps, radial n, C7) 5/5 Finger adduction (interossei, ulnar n, T1)  4/5 Hip flexion (iliopsoas, L1/L2) 4/5 Knee flexion (hamstrings, sciatic n, L5/S1)  4/5 Knee extension (quadriceps, femoral n, L3/4) 5/5 Ankle dorsiflexion (tibialis anterior, deep fibular n, L4/5) 5/5 Ankle plantarflexion (gastroc, tibial n, S1)  Exam limited by pain.   NEUROLOGICAL: MENTAL STATUS: Patient is oriented to person, place and time.  Short-term memory is intact Long-term memory is intact.  Attention span and concentration are intact.  Naming and repetition are intact. Comprehension is intact.  Expressive speech is intact.  Patient's fund of knowledge is within normal limits for educational level.  CRANIAL NERVES: Visual acuity and visual fields are intact  Extraocular muscles are intact   Facial sensation is intact bilaterally  Facial strength is intact bilaterally  Hearing is intact bilaterally  Palate elevates midline, normal phonation  Shoulder shrug strength is intact  Tongue protrudes midline   SENSATION: Pain and temperature (spinothalamic tracts) is normal. Position and vibration (dorsal columns) is normal.  COORDINATION/CEREBELLAR: Finger to nose testing is intact.  REFLEXES:  Negative Hoffman's sign bilaterally.   PAST MEDICAL HISTORY Past Medical History:  Diagnosis Date  Asthma, unspecified asthma severity, unspecified whether complicated, unspecified whether persistent  Chest pain  Depression  ILD (interstitial lung disease) (CMS-HCC)  Major depressive disorder  Sickle cell trait (CMS-HCC)   PAST SURGICAL HISTORY Past Surgical History:  Procedure Laterality Date  dental surgery  LAPAROSCOPIC TUBAL LIGATION   PAIN:  Are you having pain? Yes: NPRS scale: 8/10 Pain location: R upper thigh Pain description: persistent Aggravating factors: standing/ walking Relieving factors: recliner/ medications  PRECAUTIONS: None  WEIGHT BEARING RESTRICTIONS: No  FALLS:  Has patient fallen in last 6 months? No  LIVING ENVIRONMENT: Lives with: lives with their family and lives with an adult companion Lives in: House/apartment Stairs: No Has following equipment at home: Single point cane, Tour manager, and Grab bars  OCCUPATION: Disability  PLOF: Independent  PATIENT GOALS: Improve walking/ safety.  Decrease low back pain.    NEXT MD VISIT: 06/08/22  OBJECTIVE:   DIAGNOSTIC FINDINGS:  CLINICAL DATA:  Chronic low back pain, unspecified laterality; right lumbar radiculopathy; spinal stenosis of lumbosacral region   EXAM: MRI LUMBAR SPINE WITHOUT CONTRAST   TECHNIQUE: Multiplanar, multisequence MR imaging of the lumbar spine was performed. No intravenous contrast was administered.   COMPARISON:  03/04/2018   FINDINGS: Segmentation:   Standard.   Alignment: Stable. Mild levocurvature. Trace anterolisthesis at L4-L5.   Vertebrae: Vertebral body heights are maintained. Mild degenerative marrow edema at L5-S1 eccentric to the left.   Conus medullaris and cauda equina: Conus extends to the L2-L3 level. Conus and cauda equina appear normal.   Paraspinal and other soft tissues: Unremarkable.   Disc levels:   L1-L2:  No canal or foraminal stenosis.   L2-L3:  No canal or foraminal stenosis.   L3-L4:  Minor facet arthropathy.  No canal or foraminal stenosis.   L4-L5: Disc desiccation. Anterolisthesis with uncovering of disc. Right foraminal protrusion. Moderate facet arthropathy with ligamentum flavum infolding. No canal or left foraminal stenosis. Minor right foraminal stenosis. Disc is in proximity to but does not definitely compress the exiting L4 nerve root.   L5-S1: Disc desiccation. Mild right and moderate left facet arthropathy. No canal or foraminal stenosis.   IMPRESSION: Lower lumbar degenerative changes as detailed above. Facet arthropathy has progressed. No new or progressive stenosis.     Electronically Signed   By: Guadlupe Spanish M.D.   On: 05/17/2021 13:59  PATIENT SURVEYS:  FOTO initial 45/ goal 54  SCREENING FOR RED FLAGS: Bowel or bladder incontinence: No Spinal tumors: No Cauda equina syndrome: No Compression fracture: No Abdominal aneurysm: No  COGNITION: Overall cognitive status: Within functional limits for tasks assessed     SENSATION: WFL  MUSCLE LENGTH: Hamstrings: Right 60 deg; Left 60 deg  (severe pain reported) Maisie Fus test: NT  (pain getting into position)  POSTURE: rounded shoulders, forward head, and flexed trunk   PALPATION: Generalized lumbar hypomobility/ tenderness with palpation.  LUMBAR ROM:   Stands in flexed posture (25 deg.)- pain limited with correction.  Limited/ poor standing tolerance and required 2 seated rest breaks during lumbar testing.    AROM eval  Flexion 25% limited   Extension 100% limited (pain)  Right lateral flexion 50% limited (pain)  Left lateral flexion 50% limited (pain)  Right rotation 50% limited (pain)  Left rotation 50% limited (pain)   (Blank rows = not tested)  LOWER EXTREMITY ROM:      B LE AROM WFL except hip extension (pain)  LOWER EXTREMITY MMT:    MMT Right eval Left eval  Hip flexion 3+ (pain) 3+  Hip extension Unable to test Unable to test  Hip abduction 3 3+  Hip adduction 4 4  Hip internal rotation 4 4  Hip external rotation 4 4  Knee flexion 4 (LBP) 4  Knee extension 4 (LBP) 4  Ankle dorsiflexion    Ankle plantarflexion    Ankle inversion    Ankle eversion     (Blank rows = not tested)  LUMBAR SPECIAL TESTS:  Straight leg raise test: Positive and FABER test: Positive  FUNCTIONAL TESTS:  5 times sit to stand: TBD Berg Balance Scale: TBD  GAIT: Distance walked: in clinic Assistive device utilized: Single point cane  PT discussed use of rollator/ demonstrates Level of assistance: Modified independence Comments: Marked antalgic gait with limited hip/knee flexion and heavy B UE assist with SPC or rollator.  Poor endurance.  R LE muscle weakness/ fatigue reported.   TODAY'S TREATMENT:  DATE: 07/04/2022  Subjective: Patient reports feeling better since Monday after having stomach bug. Reports 7/10 pain in low back. Has not received   There.ex:  Nustep L4 10 min. B UE/LE for LE strengthening and gentle lumbar mobility- discussed use of rollator for improved safety/ current HEP.   Seated forward/lateral stretch with blue physioball x 10 with 3 second hold each direction   Seated hamstring stretch bilaterally 3 x 30 seconds  Seated neural glides x 10 bilaterally  Seated LAQ x 20 each LE with 3# AW Seated marching x 20 each LE  Lateral stepping in //  bars with 3# AW x 3 laps  Fwd/bwd walking in // bars with 3# AW x 2 laps   PATIENT EDUCATION:  Education details: Use of rollator/ upright posture Person educated: Patient Education method: Medical illustrator Education comprehension: verbalized understanding, returned demonstration, and needs further education  HOME EXERCISE PROGRAM: Access Code: [email or web address] URL: [email or web address] Date: 05/28/2022 Prepared by: Dorene Grebe  Exercises - Seated Long Arc Quad  - 2 x daily - 7 x weekly - 1 sets - 20 reps - Seated March  - 2 x daily - 7 x weekly - 1 sets - 20 reps - Seated Heel Toe Raises  - 2 x daily - 7 x weekly - 1 sets - 20 reps - Side Stepping with Counter Support  - 2 x daily - 7 x weekly - 1 sets - 20 reps - Sit to Stand with Counter Support  - 2 x daily - 7 x weekly - 1 sets - 10 reps  ASSESSMENT:  CLINICAL IMPRESSION:   Patient arrives to session with use of SPC and motivated to participate despite increase in pain in low back on arrival. Session focused on stretching and LE strengthening as patient complaining of increased pain in low back as session progressed. MH applied during seated exercises with minimal relief this session. Patient will continue to benefit from skilled PT interventions to address remaining deficits to improve mobility with decreasing symptoms.   OBJECTIVE IMPAIRMENTS: Abnormal gait, decreased activity tolerance, decreased balance, decreased coordination, decreased endurance, decreased knowledge of use of DME, decreased mobility, difficulty walking, decreased ROM, decreased strength, decreased safety awareness, hypomobility, impaired flexibility, improper body mechanics, postural dysfunction, and pain.   ACTIVITY LIMITATIONS: carrying, lifting, bending, sitting, standing, squatting, sleeping, stairs, transfers, bed mobility, bathing, toileting, dressing, and locomotion level  PARTICIPATION LIMITATIONS: meal prep, cleaning, laundry,  driving, shopping, and community activity  PERSONAL FACTORS: Fitness and Past/current experiences are also affecting patient's functional outcome.   REHAB POTENTIAL: Good  CLINICAL DECISION MAKING: Unstable/unpredictable  EVALUATION COMPLEXITY: High   GOALS: Goals reviewed with patient? Yes  SHORT TERM GOALS: Target date: 06/12/22  Pt. Will be able to tolerate standing 10 minutes with no increase c/o low back pain to improve household tasks/ ADL.   Baseline:  Limited standing tolerance (>8/10 pain in back/ R upper thigh) Goal status: INITIAL  LONG TERM GOALS: Target date: 07/10/22  Pt. Will increase FOTO to 54 to improve pain-free mobility.   Baseline: initial 45 Goal status: INITIAL  2.  Pt. Will complete Berg balance test and score >48/50 to decrease fall risk/ improve safety with daily activity/ walking.  Baseline: TBD Goal status: INITIAL  3.  Pt. Will report <5/10 low back/ R upper thigh pain while walking.  Baseline: >8/10 pain Goal status: INITIAL  4.  Pt. Able to ambulate with use of SPC and consistent 2-point gait pattern for 10 minutes with  no increase c/o back pain.   Baseline: limited endurance/ flexed posture/ antalgic gait.  Goal status: INITIAL  PLAN:  PT FREQUENCY: 2x/week  PT DURATION: 8 weeks  PLANNED INTERVENTIONS: Therapeutic exercises, Therapeutic activity, Neuromuscular re-education, Balance training, Gait training, Patient/Family education, Self Care, Joint mobilization, Stair training, Aquatic Therapy, Cryotherapy, Moist heat, Manual therapy, and Re-evaluation.  PLAN FOR NEXT SESSION: Supine lumbar/LE ROM and stretches.  Check on rollator timeline.   Maylon Peppers, PT, DPT Physical Therapist - [place]  [facility]   07/04/2022, 8:17 AM

## 2022-07-16 ENCOUNTER — Ambulatory Visit: Payer: Medicaid Other | Admitting: Physical Therapy

## 2022-07-18 ENCOUNTER — Encounter: Payer: Self-pay | Admitting: Physical Therapy

## 2022-07-18 ENCOUNTER — Ambulatory Visit: Payer: Medicaid Other | Attending: Neurology

## 2022-07-18 DIAGNOSIS — R293 Abnormal posture: Secondary | ICD-10-CM | POA: Insufficient documentation

## 2022-07-18 DIAGNOSIS — M5459 Other low back pain: Secondary | ICD-10-CM | POA: Diagnosis present

## 2022-07-18 DIAGNOSIS — M6281 Muscle weakness (generalized): Secondary | ICD-10-CM | POA: Diagnosis present

## 2022-07-18 DIAGNOSIS — R269 Unspecified abnormalities of gait and mobility: Secondary | ICD-10-CM | POA: Insufficient documentation

## 2022-07-18 NOTE — Therapy (Signed)
OUTPATIENT PHYSICAL THERAPY THORACOLUMBAR TREATMENT  Patient Name: Lindsey Chavez MRN: [id number] DOB:08-20-1970, 52 y.o., female Today's Date: 07/18/2022  END OF SESSION:  PT End of Session - 07/18/22 0958     Visit Number 12    Number of Visits 16    Date for PT Re-Evaluation 07/10/22    Authorization Type [place] Medicaid- [employer]    Authorization Time Period 05/15/22-07/10/22    Authorization - Number of Visits 27    Progress Note Due on Visit 10    PT Start Time 0957    PT Stop Time 1039    PT Time Calculation (min) 42 min    Activity Tolerance Patient tolerated treatment well;No increased pain;Patient limited by fatigue    Behavior During Therapy [facility] for tasks assessed/performed              Past Medical History:  Diagnosis Date   Asthma    Depressive disorder    Past Surgical History:  Procedure Laterality Date   MOUTH SURGERY     TUBAL LIGATION     Patient Active Problem List   Diagnosis Date Noted   Lymphedema 12/19/2017   Pain in both lower extremities 08/19/2017   Bilateral lower extremity edema 08/19/2017    PCP: Emogene Morgan, MD  REFERRING PROVIDER: Morene Crocker, MD  REFERRING DIAG: Other abnormalities of gait and mobility    Chronic low back pain  Rationale for Evaluation and Treatment: Rehabilitation  THERAPY DIAG:  Gait difficulty  Other low back pain  Abnormal posture  Muscle weakness (generalized)  ONSET DATE: Chronic  SUBJECTIVE:                                                                                                                                                                                           SUBJECTIVE STATEMENT: Pt. Reports chronic h/o low back pain due to scoliosis and bulging discs.  Pt. Has not worked since 2022 due to back pain and receives disability.  Pt. Reports no falls and no previous back surgeries.  Pt. Unable to drive due to back pain.  Pt. Takes Lyrica for pain and sleeps in  recliner due to increase pain in bed.  Pt. Lives with 3 sons and requires SPC/ use of grab bar for safety.  Injection with Dr. Mariah Milling 6/24  PERTINENT HISTORY:  REFERRING PHYSICIAN: Aycock, Ngwe Achirimofo* PRIMARY CARE PHYSICIAN: Aycock, Ngwe Achirimofor, MD  IMPRESSION/PLAN  Lindsey Chavez is a 52 y.o. female presenting for evaluation of  NUMBNESS/ TINGLING/ BACK PAIN/ DIFFICULTY SLEEPING/ DEPRESSION  - Unchanged. - Patient with PMH of HTN, lymphedema, lumbar radiculopathy, with ongoing neuropathy  pain. Taking Lyrica 25 mg twice daily and 100 mg at night -Labs from last visit showed A1c is in prediabetic range. Alk phos also slightly elevated. Follow up with PCP. -Reviewed note from vascular recommending compression hose, elevating lower extremities, consider lymph follow-up, recommended ultrasound lower extremities. - Provided prescription for 3 point cane -Referral to gait and balance physical therapy. -Change Lyrica to 25 mg in the morning, 25 mg in the afternoon and 100 mg at night.  -Continue to stay physically active. -Can start vitamin D 1000 units once daily. -Referral for sleep study to evaluate daytime fatigue. -Continue following with vascular. Continue wearing compression hose and elevating legs. -Alpha-lipoic acid is a naturally occurring, biological antioxidant, which might help with many chronic diseases, mostly diabetic peripheral neuropathy but can help other types of neuropathies as well. Usually given 600 mg/day, may take 3-5 wks before some symptomatic relief. Diabetic patients may need to reduce their diabetic meds. It may exacerbate thiamine deficiency in pt's with chronic malnutrition e.g. alcoholism  Follow up in 2-98mo  Medications previously tried:  CHIEF COMPLAINT & HPI  Lindsey Chavez is a 52 y.o. female presenting for evaluation of: Chief Complaint  Patient presents with  Numbness  Tingling  Back Pain  Sleeping Problem  Depression   NUMBNESS/ TINGLING/  BACK PAIN/ DIFFICULTY SLEEPING/ DEPRESSION  Patient reports with ongoing numbness and tingling in bilateral legs. Notes upcoming [place] LE in April per vascular recommendations. Following visit with vascular specialist, she was recommended to wear compression hose throughout the day and remove them before bed and to continue to elevate bilateral legs. Notes ongoing swelling in bilateral legs. States she continues to have more frequent numbness, tingling and pain in right leg.Taking Lyrica to 25 mg in the morning, 25 mg in the afternoon and 100 mg at night. He is tolerating this well. Feels this is helpful with symptoms. States continuing to see physiatry for injections in spine. Notes reducing salt intake and getting daily exercise. Notes she spoke with nutritionist and is working on weight loss. Notes ongoing difficulty falling asleep and staying asleep. Reports getting 5 hours of sleep at night. States occasional drowsiness during the day. Notes ongoing appointment with therapist. Reports current mood is stable but ongoing depression. Endorses fatigue and low energy. No recent falls but feels that balance is not doing well. Needs new prescription for cane.  DATA SUMMARY: 12/05/2021 EMG LOWERS  Impression: Abnormal study. 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   05/17/2021 MRI LUMBAR SPINE WO CONTRAST  IMPRESSION:  Lower lumbar degenerative changes as detailed above. Facet  arthropathy has progressed. No new or progressive stenosis.   VISIT SUMMARIES:  MEDICATIONS Outpatient Medications Marked as Taking for the 02/20/22 encounter (Office Visit) with Gelene Mink, PA  Medication Sig  albuterol 90 mcg/actuation inhaler Inhale 2 inhalations into the lungs every 4 (four) hours as needed for Wheezing or Shortness of Breath  ARIPiprazole (ABILIFY) 10 MG tablet Take 10 mg by mouth once daily  busPIRone (BUSPAR) 10 MG tablet  Take 10 mg by mouth 3 (three) times daily  diazePAM (VALIUM) 5 MG tablet 1-2 30 minutes before ESI  methocarbamoL (ROBAXIN) 500 MG tablet Take 500 mg by mouth 4 (four) times daily  pregabalin (LYRICA) 25 MG capsule Take 25 mg in the morning 25 mg in the afternoon and 100 mg at night  risperiDONE (RISPERDAL) 1 MG tablet Take 2 mg by mouth nightly   ALLERGIES  Allergies  Allergen Reactions  Pork/Porcine Containing Products Hives   EXAM   Vitals:  02/20/22 0902  BP: 108/77  Pulse: 72  Weight: (!) 118.8 kg (262 lb)  PainSc: 7  PainLoc: Leg   Body mass index is 39.84 kg/m.  GENERAL: Pleasant female, no distress. Normocephalic and atraumatic.  Baseline neurological exam below was obtained at prior office visit. Changes from today's visit appear in bold.   EYES: PERRLA EOM's intact  MUSCULOSKELETAL: Bulk - Normal Tone - Normal Pronator Drift - Absent bilaterally. Ambulation - Gait and station is slow, antalgic, limping.  Romberg - absent  R/L 5/5 Shoulder abduction (deltoid/supraspinatus, axillary/suprascapular n, C5) 5/5 Elbow flexion (biceps brachii, musculoskeletal n, C5-6) 5/5 Elbow extension (triceps, radial n, C7) 5/5 Finger adduction (interossei, ulnar n, T1)  4/5 Hip flexion (iliopsoas, L1/L2) 4/5 Knee flexion (hamstrings, sciatic n, L5/S1)  4/5 Knee extension (quadriceps, femoral n, L3/4) 5/5 Ankle dorsiflexion (tibialis anterior, deep fibular n, L4/5) 5/5 Ankle plantarflexion (gastroc, tibial n, S1)  Exam limited by pain.   NEUROLOGICAL: MENTAL STATUS: Patient is oriented to person, place and time.  Short-term memory is intact Long-term memory is intact.  Attention span and concentration are intact.  Naming and repetition are intact. Comprehension is intact.  Expressive speech is intact.  Patient's fund of knowledge is within normal limits for educational level.  CRANIAL NERVES: Visual acuity and visual fields are intact  Extraocular muscles are  intact  Facial sensation is intact bilaterally  Facial strength is intact bilaterally  Hearing is intact bilaterally  Palate elevates midline, normal phonation  Shoulder shrug strength is intact  Tongue protrudes midline   SENSATION: Pain and temperature (spinothalamic tracts) is normal. Position and vibration (dorsal columns) is normal.  COORDINATION/CEREBELLAR: Finger to nose testing is intact.  REFLEXES:  Negative Hoffman's sign bilaterally.   PAST MEDICAL HISTORY Past Medical History:  Diagnosis Date  Asthma, unspecified asthma severity, unspecified whether complicated, unspecified whether persistent  Chest pain  Depression  ILD (interstitial lung disease) (CMS-HCC)  Major depressive disorder  Sickle cell trait (CMS-HCC)   PAST SURGICAL HISTORY Past Surgical History:  Procedure Laterality Date  dental surgery  LAPAROSCOPIC TUBAL LIGATION   PAIN:  Are you having pain? Yes: NPRS scale: 8/10 Pain location: R upper thigh Pain description: persistent Aggravating factors: standing/ walking Relieving factors: recliner/ medications  PRECAUTIONS: None  WEIGHT BEARING RESTRICTIONS: No  FALLS:  Has patient fallen in last 6 months? No  LIVING ENVIRONMENT: Lives with: lives with their family and lives with an adult companion Lives in: House/apartment Stairs: No Has following equipment at home: Single point cane, Tour manager, and Grab bars  OCCUPATION: Disability  PLOF: Independent  PATIENT GOALS: Improve walking/ safety.  Decrease low back pain.    NEXT MD VISIT: 06/08/22  OBJECTIVE:   DIAGNOSTIC FINDINGS:  CLINICAL DATA:  Chronic low back pain, unspecified laterality; right lumbar radiculopathy; spinal stenosis of lumbosacral region   EXAM: MRI LUMBAR SPINE WITHOUT CONTRAST   TECHNIQUE: Multiplanar, multisequence MR imaging of the lumbar spine was performed. No intravenous contrast was administered.   COMPARISON:  03/04/2018    FINDINGS: Segmentation:  Standard.   Alignment: Stable. Mild levocurvature. Trace anterolisthesis at L4-L5.   Vertebrae: Vertebral body heights are maintained. Mild degenerative marrow edema at L5-S1 eccentric to the left.   Conus medullaris and cauda equina: Conus extends to the L2-L3 level. Conus and cauda equina appear normal.   Paraspinal and other soft tissues: Unremarkable.   Disc levels:  L1-L2:  No canal or foraminal stenosis.   L2-L3:  No canal or foraminal stenosis.   L3-L4:  Minor facet arthropathy.  No canal or foraminal stenosis.   L4-L5: Disc desiccation. Anterolisthesis with uncovering of disc. Right foraminal protrusion. Moderate facet arthropathy with ligamentum flavum infolding. No canal or left foraminal stenosis. Minor right foraminal stenosis. Disc is in proximity to but does not definitely compress the exiting L4 nerve root.   L5-S1: Disc desiccation. Mild right and moderate left facet arthropathy. No canal or foraminal stenosis.   IMPRESSION: Lower lumbar degenerative changes as detailed above. Facet arthropathy has progressed. No new or progressive stenosis.     Electronically Signed   By: Guadlupe Spanish M.D.   On: 05/17/2021 13:59  PATIENT SURVEYS:  FOTO initial 45/ goal 54  SCREENING FOR RED FLAGS: Bowel or bladder incontinence: No Spinal tumors: No Cauda equina syndrome: No Compression fracture: No Abdominal aneurysm: No  COGNITION: Overall cognitive status: Within functional limits for tasks assessed     SENSATION: WFL  MUSCLE LENGTH: Hamstrings: Right 60 deg; Left 60 deg  (severe pain reported) Maisie Fus test: NT  (pain getting into position)  POSTURE: rounded shoulders, forward head, and flexed trunk   PALPATION: Generalized lumbar hypomobility/ tenderness with palpation.  LUMBAR ROM:   Stands in flexed posture (25 deg.)- pain limited with correction.  Limited/ poor standing tolerance and required 2 seated rest breaks  during lumbar testing.   AROM eval  Flexion 25% limited   Extension 100% limited (pain)  Right lateral flexion 50% limited (pain)  Left lateral flexion 50% limited (pain)  Right rotation 50% limited (pain)  Left rotation 50% limited (pain)   (Blank rows = not tested)  LOWER EXTREMITY ROM:      B LE AROM WFL except hip extension (pain)  LOWER EXTREMITY MMT:    MMT Right eval Left eval  Hip flexion 3+ (pain) 3+  Hip extension Unable to test Unable to test  Hip abduction 3 3+  Hip adduction 4 4  Hip internal rotation 4 4  Hip external rotation 4 4  Knee flexion 4 (LBP) 4  Knee extension 4 (LBP) 4  Ankle dorsiflexion    Ankle plantarflexion    Ankle inversion    Ankle eversion     (Blank rows = not tested)  LUMBAR SPECIAL TESTS:  Straight leg raise test: Positive and FABER test: Positive  FUNCTIONAL TESTS:  5 times sit to stand: TBD Berg Balance Scale: TBD  GAIT: Distance walked: in clinic Assistive device utilized: Single point cane  PT discussed use of rollator/ demonstrates Level of assistance: Modified independence Comments: Marked antalgic gait with limited hip/knee flexion and heavy B UE assist with SPC or rollator.  Poor endurance.  R LE muscle weakness/ fatigue reported.   TODAY'S TREATMENT:  DATE: 07/18/2022  Subjective: Patient frustrated about transportation issues and financial issues. Has been unable to afford rollator out of pocket at this time due to car trouble. Reports 7/10 back pain on arrival.  Physical therapy treatment session today consisted of completing assessment of goals and administration of testing as demonstrated and documented in flow sheet, treatment, and goals section of this note. Addition treatments may be found below.   There.ex:  Nustep L4 5 min. B UE/LE for LE strengthening and gentle lumbar mobility-  discussed use of rollator for improved safety/ current HEP.    Seated LAQ x 20 each LE with 3# AW Lateral stepping in // bars with 3# AW x 2 laps  Fwd/bwd walking in // bars with 3# AW x 3 laps   PATIENT EDUCATION:  Education details: Use of rollator/ upright posture Person educated: Patient Education method: Medical illustrator Education comprehension: verbalized understanding, returned demonstration, and needs further education  HOME EXERCISE PROGRAM: Access Code: [email or web address] URL: [email or web address] Date: 05/28/2022 Prepared by: Dorene Grebe  Exercises - Seated Long Arc Quad  - 2 x daily - 7 x weekly - 1 sets - 20 reps - Seated March  - 2 x daily - 7 x weekly - 1 sets - 20 reps - Seated Heel Toe Raises  - 2 x daily - 7 x weekly - 1 sets - 20 reps - Side Stepping with Counter Support  - 2 x daily - 7 x weekly - 1 sets - 20 reps - Sit to Stand with Counter Support  - 2 x daily - 7 x weekly - 1 sets - 10 reps  ASSESSMENT:  CLINICAL IMPRESSION:    Patient arrives to therapy session frustrated due to transportation issues. Goals were reassessed with patient demonstrating high fall risk on Berg Balance Test and continues to have difficulty standing for >10 minutes without increase in pain. Patient's condition has the potential to improve in response to therapy. Maximum improvement is yet to be obtained. The anticipated improvement is attainable and reasonable in a generally predictable time.Patient will continue to benefit from skilled PT interventions to address remaining deficits to improve mobility with decreasing symptoms.  OBJECTIVE IMPAIRMENTS: Abnormal gait, decreased activity tolerance, decreased balance, decreased coordination, decreased endurance, decreased knowledge of use of DME, decreased mobility, difficulty walking, decreased ROM, decreased strength, decreased safety awareness, hypomobility, impaired flexibility, improper body mechanics, postural  dysfunction, and pain.   ACTIVITY LIMITATIONS: carrying, lifting, bending, sitting, standing, squatting, sleeping, stairs, transfers, bed mobility, bathing, toileting, dressing, and locomotion level  PARTICIPATION LIMITATIONS: meal prep, cleaning, laundry, driving, shopping, and community activity  PERSONAL FACTORS: Fitness and Past/current experiences are also affecting patient's functional outcome.   REHAB POTENTIAL: Good  CLINICAL DECISION MAKING: Unstable/unpredictable  EVALUATION COMPLEXITY: High   GOALS: Goals reviewed with patient? Yes  SHORT TERM GOALS: Target date: 06/12/22  Pt. Will be able to tolerate standing 10 minutes with no increase c/o low back pain to improve household tasks/ ADL.   Baseline:  Limited standing tolerance (>8/10 pain in back/ R upper thigh); 7/10: unable to tolerate standing x 10 minutes without increase in back pain (10/10) Goal status: IN PROGRESS  LONG TERM GOALS: Target date: 07/10/22  Pt. Will increase FOTO to 54 to improve pain-free mobility.   Baseline: initial 45; 7/10: 23/100 Goal status: IN PROGRESS  2.  Pt. Will complete Berg balance test and score >48/50 to decrease fall risk/ improve safety with daily activity/ walking.  Baseline: 7/10: 31/50 Goal status:  IN PROGRESS  3.  Pt. Will report <5/10 low back/ R upper thigh pain while walking.  Baseline: >8/10 pain; 7/10: 7/10 pain with ambulation Goal status: IN PROGRESS  4.  Pt. Able to ambulate with use of SPC and consistent 2-point gait pattern for 10 minutes with no increase c/o back pain.   Baseline: limited endurance/ flexed posture/ antalgic gait. 7/10: unable to ambulate >10 minutes without pain increasing to 10/10  Goal status: IN PROGRESS  PLAN:  PT FREQUENCY: 2x/week  PT DURATION: 8 weeks  PLANNED INTERVENTIONS: Therapeutic exercises, Therapeutic activity, Neuromuscular re-education, Balance training, Gait training, Patient/Family education, Self Care, Joint mobilization,  Stair training, Aquatic Therapy, Cryotherapy, Moist heat, Manual therapy, and Re-evaluation.  PLAN FOR NEXT SESSION: Supine lumbar/LE ROM and stretches.  Check on rollator timeline.   Maylon Peppers, PT, DPT Physical Therapist - [facility]   07/18/2022, 10:38 AM

## 2022-07-23 ENCOUNTER — Ambulatory Visit: Payer: Medicaid Other | Admitting: Physical Therapy

## 2022-07-25 ENCOUNTER — Encounter: Payer: Self-pay | Admitting: Physical Therapy

## 2022-07-25 ENCOUNTER — Ambulatory Visit: Payer: Medicaid Other | Admitting: Physical Therapy

## 2022-07-25 DIAGNOSIS — M6281 Muscle weakness (generalized): Secondary | ICD-10-CM

## 2022-07-25 DIAGNOSIS — R269 Unspecified abnormalities of gait and mobility: Secondary | ICD-10-CM | POA: Diagnosis not present

## 2022-07-25 DIAGNOSIS — M5459 Other low back pain: Secondary | ICD-10-CM

## 2022-07-25 DIAGNOSIS — R293 Abnormal posture: Secondary | ICD-10-CM

## 2022-07-25 NOTE — Therapy (Signed)
OUTPATIENT PHYSICAL THERAPY THORACOLUMBAR TREATMENT/RECERITIFCATION  Patient Name: Lindsey Chavez MRN: [id number] DOB:[birth year], 52 y.o., female Today's Date: 07/25/2022  END OF SESSION:  PT End of Session - 07/25/22 0836     Visit Number 13    Number of Visits 25    Date for PT Re-Evaluation 08/21/22    PT Start Time 0839    PT Stop Time 0944    PT Time Calculation (min) 65 min              Past Medical History:  Diagnosis Date   Asthma    Depressive disorder    Past Surgical History:  Procedure Laterality Date   MOUTH SURGERY     TUBAL LIGATION     Patient Active Problem List   Diagnosis Date Noted   Lymphedema 12/19/2017   Pain in both lower extremities 08/19/2017   Bilateral lower extremity edema 08/19/2017    PCP: Lindsey Morgan, MD  REFERRING PROVIDER: Morene Crocker, MD  REFERRING DIAG: Other abnormalities of gait and mobility    Chronic low back pain  Rationale for Evaluation and Treatment: Rehabilitation  THERAPY DIAG:  Gait difficulty  Other low back pain  Abnormal posture  Muscle weakness (generalized)  ONSET DATE: Chronic  SUBJECTIVE:                                                                                                                                                                                           SUBJECTIVE STATEMENT: Pt. Reports chronic h/o low back pain due to scoliosis and bulging discs.  Pt. Has not worked since 2022 due to back pain and receives disability.  Pt. Reports no falls and no previous back surgeries.  Pt. Unable to drive due to back pain.  Pt. Takes Lyrica for pain and sleeps in recliner due to increase pain in bed.  Pt. Lives with 3 sons and requires SPC/ use of grab bar for safety.  Injection with Dr. Mariah Chavez 6/24  PERTINENT HISTORY:  REFERRING PHYSICIAN: Aycock, Ngwe Chavez* PRIMARY CARE PHYSICIAN: Aycock, Ngwe Achirimofor, MD  IMPRESSION/PLAN  Lindsey Chavez is a 52 y.o. female  presenting for evaluation of  NUMBNESS/ TINGLING/ BACK PAIN/ DIFFICULTY SLEEPING/ DEPRESSION  - Unchanged. - Patient with PMH of HTN, lymphedema, lumbar radiculopathy, with ongoing neuropathy pain. Taking Lyrica 25 mg twice daily and 100 mg at night -Labs from last visit showed A1c is in prediabetic range. Alk phos also slightly elevated. Follow up with PCP. -Reviewed note from vascular recommending compression hose, elevating lower extremities, consider lymph follow-up, recommended ultrasound lower extremities. - Provided prescription for 3 point cane -Referral to gait  and balance physical therapy. -Change Lyrica to 25 mg in the morning, 25 mg in the afternoon and 100 mg at night.  -Continue to stay physically active. -Can start vitamin D 1000 units once daily. -Referral for sleep study to evaluate daytime fatigue. -Continue following with vascular. Continue wearing compression hose and elevating legs. -Alpha-lipoic acid is a naturally occurring, biological antioxidant, which might help with many chronic diseases, mostly diabetic peripheral neuropathy but can help other types of neuropathies as well. Usually given 600 mg/day, may take 3-5 wks before some symptomatic relief. Diabetic patients may need to reduce their diabetic meds. It may exacerbate thiamine deficiency in pt's with chronic malnutrition e.g. alcoholism  Follow up in 2-72mo  Medications previously tried:  CHIEF COMPLAINT & HPI  Lindsey Chavez is a 52 y.o. female presenting for evaluation of: Chief Complaint  Patient presents with  Numbness  Tingling  Back Pain  Sleeping Problem  Depression   NUMBNESS/ TINGLING/ BACK PAIN/ DIFFICULTY SLEEPING/ DEPRESSION  Patient reports with ongoing numbness and tingling in bilateral legs. Notes upcoming [place] LE in April per vascular recommendations. Following visit with vascular specialist, she was recommended to wear compression hose throughout the day and remove them before bed and to  continue to elevate bilateral legs. Notes ongoing swelling in bilateral legs. States she continues to have more frequent numbness, tingling and pain in right leg.Taking Lyrica to 25 mg in the morning, 25 mg in the afternoon and 100 mg at night. He is tolerating this well. Feels this is helpful with symptoms. States continuing to see physiatry for injections in spine. Notes reducing salt intake and getting daily exercise. Notes she spoke with nutritionist and is working on weight loss. Notes ongoing difficulty falling asleep and staying asleep. Reports getting 5 hours of sleep at night. States occasional drowsiness during the day. Notes ongoing appointment with therapist. Reports current mood is stable but ongoing depression. Endorses fatigue and low energy. No recent falls but feels that balance is not doing well. Needs new prescription for cane.  DATA SUMMARY: 12/05/2021 EMG LOWERS  Impression: Abnormal study. 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   05/17/2021 MRI LUMBAR SPINE WO CONTRAST  IMPRESSION:  Lower lumbar degenerative changes as detailed above. Facet  arthropathy has progressed. No new or progressive stenosis.   VISIT SUMMARIES:  MEDICATIONS Outpatient Medications Marked as Taking for the 02/20/22 encounter (Office Visit) with Gelene Mink, PA  Medication Sig  albuterol 90 mcg/actuation inhaler Inhale 2 inhalations into the lungs every 4 (four) hours as needed for Wheezing or Shortness of Breath  ARIPiprazole (ABILIFY) 10 MG tablet Take 10 mg by mouth once daily  busPIRone (BUSPAR) 10 MG tablet Take 10 mg by mouth 3 (three) times daily  diazePAM (VALIUM) 5 MG tablet 1-2 30 minutes before ESI  methocarbamoL (ROBAXIN) 500 MG tablet Take 500 mg by mouth 4 (four) times daily  pregabalin (LYRICA) 25 MG capsule Take 25 mg in the morning 25 mg in the afternoon and 100 mg at night  risperiDONE (RISPERDAL) 1  MG tablet Take 2 mg by mouth nightly   ALLERGIES Allergies  Allergen Reactions  Pork/Porcine Containing Products Hives   EXAM   Vitals:  02/20/22 0902  BP: 108/77  Pulse: 72  Weight: (!) 118.8 kg (262 lb)  PainSc: 7  PainLoc: Leg   Body mass index is 39.84 kg/m.  GENERAL: Pleasant female, no distress. Normocephalic and atraumatic.  Baseline neurological exam  below was obtained at prior office visit. Changes from today's visit appear in bold.   EYES: PERRLA EOM's intact  MUSCULOSKELETAL: Bulk - Normal Tone - Normal Pronator Drift - Absent bilaterally. Ambulation - Gait and station is slow, antalgic, limping.  Romberg - absent  R/L 5/5 Shoulder abduction (deltoid/supraspinatus, axillary/suprascapular n, C5) 5/5 Elbow flexion (biceps brachii, musculoskeletal n, C5-6) 5/5 Elbow extension (triceps, radial n, C7) 5/5 Finger adduction (interossei, ulnar n, T1)  4/5 Hip flexion (iliopsoas, L1/L2) 4/5 Knee flexion (hamstrings, sciatic n, L5/S1)  4/5 Knee extension (quadriceps, femoral n, L3/4) 5/5 Ankle dorsiflexion (tibialis anterior, deep fibular n, L4/5) 5/5 Ankle plantarflexion (gastroc, tibial n, S1)  Exam limited by pain.   NEUROLOGICAL: MENTAL STATUS: Patient is oriented to person, place and time.  Short-term memory is intact Long-term memory is intact.  Attention span and concentration are intact.  Naming and repetition are intact. Comprehension is intact.  Expressive speech is intact.  Patient's fund of knowledge is within normal limits for educational level.  CRANIAL NERVES: Visual acuity and visual fields are intact  Extraocular muscles are intact  Facial sensation is intact bilaterally  Facial strength is intact bilaterally  Hearing is intact bilaterally  Palate elevates midline, normal phonation  Shoulder shrug strength is intact  Tongue protrudes midline   SENSATION: Pain and temperature (spinothalamic tracts) is normal. Position and  vibration (dorsal columns) is normal.  COORDINATION/CEREBELLAR: Finger to nose testing is intact.  REFLEXES:  Negative Hoffman's sign bilaterally.   PAST MEDICAL HISTORY Past Medical History:  Diagnosis Date  Asthma, unspecified asthma severity, unspecified whether complicated, unspecified whether persistent  Chest pain  Depression  ILD (interstitial lung disease) (CMS-HCC)  Major depressive disorder  Sickle cell trait (CMS-HCC)   PAST SURGICAL HISTORY Past Surgical History:  Procedure Laterality Date  dental surgery  LAPAROSCOPIC TUBAL LIGATION   PAIN:  Are you having pain? Yes: NPRS scale: 8/10 Pain location: R upper thigh Pain description: persistent Aggravating factors: standing/ walking Relieving factors: recliner/ medications  PRECAUTIONS: None  WEIGHT BEARING RESTRICTIONS: No  FALLS:  Has patient fallen in last 6 months? No  LIVING ENVIRONMENT: Lives with: lives with their family and lives with an adult companion Lives in: House/apartment Stairs: No Has following equipment at home: Single point cane, Tour manager, and Grab bars  OCCUPATION: Disability  PLOF: Independent  PATIENT GOALS: Improve walking/ safety.  Decrease low back pain.    NEXT MD VISIT: 06/08/22  OBJECTIVE:   DIAGNOSTIC FINDINGS:  CLINICAL DATA:  Chronic low back pain, unspecified laterality; right lumbar radiculopathy; spinal stenosis of lumbosacral region   EXAM: MRI LUMBAR SPINE WITHOUT CONTRAST   TECHNIQUE: Multiplanar, multisequence MR imaging of the lumbar spine was performed. No intravenous contrast was administered.   COMPARISON:  03/04/2018   FINDINGS: Segmentation:  Standard.   Alignment: Stable. Mild levocurvature. Trace anterolisthesis at L4-L5.   Vertebrae: Vertebral body heights are maintained. Mild degenerative marrow edema at L5-S1 eccentric to the left.   Conus medullaris and cauda equina: Conus extends to the L2-L3 level. Conus and cauda equina  appear normal.   Paraspinal and other soft tissues: Unremarkable.   Disc levels:   L1-L2:  No canal or foraminal stenosis.   L2-L3:  No canal or foraminal stenosis.   L3-L4:  Minor facet arthropathy.  No canal or foraminal stenosis.   L4-L5: Disc desiccation. Anterolisthesis with uncovering of disc. Right foraminal protrusion. Moderate facet arthropathy with ligamentum flavum infolding. No canal or left foraminal stenosis. Minor right  foraminal stenosis. Disc is in proximity to but does not definitely compress the exiting L4 nerve root.   L5-S1: Disc desiccation. Mild right and moderate left facet arthropathy. No canal or foraminal stenosis.   IMPRESSION: Lower lumbar degenerative changes as detailed above. Facet arthropathy has progressed. No new or progressive stenosis.     Electronically Signed   By: Guadlupe Spanish M.D.   On: 05/17/2021 13:59  PATIENT SURVEYS:  FOTO initial 45/ goal 54  SCREENING FOR RED FLAGS: Bowel or bladder incontinence: No Spinal tumors: No Cauda equina syndrome: No Compression fracture: No Abdominal aneurysm: No  COGNITION: Overall cognitive status: Within functional limits for tasks assessed     SENSATION: WFL  MUSCLE LENGTH: Hamstrings: Right 60 deg; Left 60 deg  (severe pain reported) Maisie Fus test: NT  (pain getting into position)  POSTURE: rounded shoulders, forward head, and flexed trunk   PALPATION: Generalized lumbar hypomobility/ tenderness with palpation.  LUMBAR ROM:   Stands in flexed posture (25 deg.)- pain limited with correction.  Limited/ poor standing tolerance and required 2 seated rest breaks during lumbar testing.   AROM eval  Flexion 25% limited   Extension 100% limited (pain)  Right lateral flexion 50% limited (pain)  Left lateral flexion 50% limited (pain)  Right rotation 50% limited (pain)  Left rotation 50% limited (pain)   (Blank rows = not tested)  LOWER EXTREMITY ROM:      B LE AROM WFL except  hip extension (pain)  LOWER EXTREMITY MMT:    MMT Right eval Left eval  Hip flexion 3+ (pain) 3+  Hip extension Unable to test Unable to test  Hip abduction 3 3+  Hip adduction 4 4  Hip internal rotation 4 4  Hip external rotation 4 4  Knee flexion 4 (LBP) 4  Knee extension 4 (LBP) 4  Ankle dorsiflexion    Ankle plantarflexion    Ankle inversion    Ankle eversion     (Blank rows = not tested)  LUMBAR SPECIAL TESTS:  Straight leg raise test: Positive and FABER test: Positive  FUNCTIONAL TESTS:  5 times sit to stand: TBD Berg Balance Scale: TBD  GAIT: Distance walked: in clinic Assistive device utilized: Single point cane  PT discussed use of rollator/ demonstrates Level of assistance: Modified independence Comments: Marked antalgic gait with limited hip/knee flexion and heavy B UE assist with SPC or rollator.  Poor endurance.  R LE muscle weakness/ fatigue reported.   TODAY'S TREATMENT:                                                                                                                              DATE: 07/25/2022  Subjective: Patient remains frustrated about transportation issues and financial issues.  Pt. Still waiting to hear from medical supply store about rollator (pt. Entered PT with use of SPC).  Pt. reports 6/10 back pain on arrival.  Physical therapy treatment session today consisted of completing assessment  of goals and administration of testing as demonstrated and documented in flow sheet, treatment, and goals section of this note. Addition treatments may be found below.   There.ex:  Nustep L5 7 min. B UE/LE for LE strengthening and gentle lumbar mobility- discussed use of rollator for improved safety/ current HEP.  Discussed weekend activities.  O2 sat. 98%/ HR 77 bpm.    Seated LAQ/ marching x 20 each LE with 3# AW.  Standing hamstring curls 20x (3# AW).   Lateral stepping in // bars with 3# AW x 2 laps  Fwd/bwd walking in // bars with 3# AW x 2  laps   Walking in hallway/ standing tolerance reassessment in //-bars.    Neuro.mm.:  Berg: 31/56.  Extra time and rest breaks required.   5xSTS: 24.56 seconds    PATIENT EDUCATION:  Education details: Use of rollator/ upright posture Person educated: Patient Education method: Medical illustrator Education comprehension: verbalized understanding, returned demonstration, and needs further education  HOME EXERCISE PROGRAM: Access Code: [email or web address] URL: [email or web address] Date: 05/28/2022 Prepared by: Dorene Grebe  Exercises - Seated Long Arc Quad  - 2 x daily - 7 x weekly - 1 sets - 20 reps - Seated March  - 2 x daily - 7 x weekly - 1 sets - 20 reps - Seated Heel Toe Raises  - 2 x daily - 7 x weekly - 1 sets - 20 reps - Side Stepping with Counter Support  - 2 x daily - 7 x weekly - 1 sets - 20 reps - Sit to Stand with Counter Support  - 2 x daily - 7 x weekly - 1 sets - 10 reps  ASSESSMENT:  CLINICAL IMPRESSION:    Patient arrives to therapy session frustrated due to transportation issues. Goals were reassessed with patient demonstrating high fall risk on Berg Balance Test and continues to have difficulty standing for >10 minutes without increase in pain. Patient's condition has the potential to improve in response to therapy. Maximum improvement is yet to be obtained. The anticipated improvement is attainable and reasonable in a generally predictable time.Patient will continue to benefit from skilled PT interventions to address remaining deficits to improve mobility with decreasing symptoms.  OBJECTIVE IMPAIRMENTS: Abnormal gait, decreased activity tolerance, decreased balance, decreased coordination, decreased endurance, decreased knowledge of use of DME, decreased mobility, difficulty walking, decreased ROM, decreased strength, decreased safety awareness, hypomobility, impaired flexibility, improper body mechanics, postural dysfunction, and pain.    ACTIVITY LIMITATIONS: carrying, lifting, bending, sitting, standing, squatting, sleeping, stairs, transfers, bed mobility, bathing, toileting, dressing, and locomotion level  PARTICIPATION LIMITATIONS: meal prep, cleaning, laundry, driving, shopping, and community activity  PERSONAL FACTORS: Fitness and Past/current experiences are also affecting patient's functional outcome.   REHAB POTENTIAL: Good  CLINICAL DECISION MAKING: Unstable/unpredictable  EVALUATION COMPLEXITY: High   GOALS: Goals reviewed with patient? Yes  SHORT TERM GOALS: Target date: 07/31/22  Pt. Will be able to tolerate standing 10 minutes with no increase c/o low back pain to improve household tasks/ ADL.   Baseline:  Limited standing tolerance (>8/10 pain in back/ R upper thigh); 7/10: unable to tolerate standing x 10 minutes without increase in back pain (10/10) Goal status: Partially met  LONG TERM GOALS: Target date: 08/21/22  Pt. Will increase FOTO to 54 to improve pain-free mobility.   Baseline: initial 45; 7/10: 23/100 Goal status: IN PROGRESS  2.  Pt. Will complete Berg balance test and score >48/56 to decrease fall risk/ improve safety with daily activity/  walking.  Baseline: 7/10: 31/56.   Goal status: IN PROGRESS  3.  Pt. Will report <5/10 low back/ R upper thigh pain while walking.  Baseline: >8/10 pain; 7/10: 7/10 pain with ambulation Goal status: IN PROGRESS  4.  Pt. Able to ambulate with use of SPC and consistent 2-point gait pattern for 10 minutes with no increase c/o back pain.   Baseline: limited endurance/ flexed posture/ antalgic gait. 7/10: unable to ambulate >10 minutes without pain increasing to 10/10  Goal status: IN PROGRESS  PLAN:  PT FREQUENCY: 2x/week  PT DURATION: 6 weeks  PLANNED INTERVENTIONS: Therapeutic exercises, Therapeutic activity, Neuromuscular re-education, Balance training, Gait training, Patient/Family education, Self Care, Joint mobilization, Stair training,  Aquatic Therapy, Cryotherapy, Moist heat, Manual therapy, and Re-evaluation.  PLAN FOR NEXT SESSION:  PT will call medical supply store about rollator?   [name], PT, DPT # [phone] Physical Therapist - [facility]   07/25/2022, 10:37 AM

## 2022-07-30 ENCOUNTER — Ambulatory Visit: Payer: Medicaid Other | Admitting: Physical Therapy

## 2022-07-30 DIAGNOSIS — R269 Unspecified abnormalities of gait and mobility: Secondary | ICD-10-CM | POA: Diagnosis not present

## 2022-07-30 DIAGNOSIS — M5459 Other low back pain: Secondary | ICD-10-CM

## 2022-07-30 DIAGNOSIS — M6281 Muscle weakness (generalized): Secondary | ICD-10-CM

## 2022-07-30 DIAGNOSIS — R293 Abnormal posture: Secondary | ICD-10-CM

## 2022-08-01 ENCOUNTER — Ambulatory Visit: Payer: Medicaid Other

## 2022-08-01 ENCOUNTER — Encounter: Payer: Self-pay | Admitting: Physical Therapy

## 2022-08-01 DIAGNOSIS — M5459 Other low back pain: Secondary | ICD-10-CM

## 2022-08-01 DIAGNOSIS — R269 Unspecified abnormalities of gait and mobility: Secondary | ICD-10-CM

## 2022-08-01 NOTE — Therapy (Deleted)
OUTPATIENT PHYSICAL THERAPY THORACOLUMBAR TREATMENT  Patient Name: Lindsey Chavez MRN: [id number] DOB:1970/06/04, 52 y.o., female Today's Date: 07/30/2022  END OF SESSION:  PT End of Session - 08/01/22 0734     Visit Number 14    Number of Visits 25    Date for PT Re-Evaluation 08/21/22    PT Start Time 0848    PT Stop Time 0940    PT Time Calculation (min) 52 min              Past Medical History:  Diagnosis Date   Asthma    Depressive disorder    Past Surgical History:  Procedure Laterality Date   MOUTH SURGERY     TUBAL LIGATION     Patient Active Problem List   Diagnosis Date Noted   Lymphedema 12/19/2017   Pain in both lower extremities 08/19/2017   Bilateral lower extremity edema 08/19/2017    PCP: Emogene Morgan, MD  REFERRING PROVIDER: Morene Crocker, MD  REFERRING DIAG: Other abnormalities of gait and mobility    Chronic low back pain  Rationale for Evaluation and Treatment: Rehabilitation  THERAPY DIAG:  Gait difficulty  Other low back pain  Abnormal posture  Muscle weakness (generalized)  ONSET DATE: Chronic  SUBJECTIVE:                                                                                                                                                                                           SUBJECTIVE STATEMENT: Pt. Reports chronic h/o low back pain due to scoliosis and bulging discs.  Pt. Has not worked since 2022 due to back pain and receives disability.  Pt. Reports no falls and no previous back surgeries.  Pt. Unable to drive due to back pain.  Pt. Takes Lyrica for pain and sleeps in recliner due to increase pain in bed.  Pt. Lives with 3 sons and requires SPC/ use of grab bar for safety.  Injection with Dr. Mariah Milling 6/24  PERTINENT HISTORY:  REFERRING PHYSICIAN: Aycock, Ngwe Chavez* PRIMARY CARE PHYSICIAN: Aycock, Ngwe Achirimofor, MD  IMPRESSION/PLAN  Lindsey Chavez is a 52 y.o. female presenting for evaluation  of  NUMBNESS/ TINGLING/ BACK PAIN/ DIFFICULTY SLEEPING/ DEPRESSION  - Unchanged. - Patient with PMH of HTN, lymphedema, lumbar radiculopathy, with ongoing neuropathy pain. Taking Lyrica 25 mg twice daily and 100 mg at night -Labs from last visit showed A1c is in prediabetic range. Alk phos also slightly elevated. Follow up with PCP. -Reviewed note from vascular recommending compression hose, elevating lower extremities, consider lymph follow-up, recommended ultrasound lower extremities. - Provided prescription for 3 point cane -Referral to gait  and balance physical therapy. -Change Lyrica to 25 mg in the morning, 25 mg in the afternoon and 100 mg at night.  -Continue to stay physically active. -Can start vitamin D 1000 units once daily. -Referral for sleep study to evaluate daytime fatigue. -Continue following with vascular. Continue wearing compression hose and elevating legs. -Alpha-lipoic acid is a naturally occurring, biological antioxidant, which might help with many chronic diseases, mostly diabetic peripheral neuropathy but can help other types of neuropathies as well. Usually given 600 mg/day, may take 3-5 wks before some symptomatic relief. Diabetic patients may need to reduce their diabetic meds. It may exacerbate thiamine deficiency in pt's with chronic malnutrition e.g. alcoholism  Follow up in 2-30mo  Medications previously tried:  CHIEF COMPLAINT & HPI  Lindsey Chavez is a 52 y.o. female presenting for evaluation of: Chief Complaint  Patient presents with  Numbness  Tingling  Back Pain  Sleeping Problem  Depression   NUMBNESS/ TINGLING/ BACK PAIN/ DIFFICULTY SLEEPING/ DEPRESSION  Patient reports with ongoing numbness and tingling in bilateral legs. Notes upcoming [place] LE in April per vascular recommendations. Following visit with vascular specialist, she was recommended to wear compression hose throughout the day and remove them before bed and to continue to elevate bilateral  legs. Notes ongoing swelling in bilateral legs. States she continues to have more frequent numbness, tingling and pain in right leg.Taking Lyrica to 25 mg in the morning, 25 mg in the afternoon and 100 mg at night. He is tolerating this well. Feels this is helpful with symptoms. States continuing to see physiatry for injections in spine. Notes reducing salt intake and getting daily exercise. Notes she spoke with nutritionist and is working on weight loss. Notes ongoing difficulty falling asleep and staying asleep. Reports getting 5 hours of sleep at night. States occasional drowsiness during the day. Notes ongoing appointment with therapist. Reports current mood is stable but ongoing depression. Endorses fatigue and low energy. No recent falls but feels that balance is not doing well. Needs new prescription for cane.  DATA SUMMARY: 12/05/2021 EMG LOWERS  Impression: Abnormal study. 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   05/17/2021 MRI LUMBAR SPINE WO CONTRAST  IMPRESSION:  Lower lumbar degenerative changes as detailed above. Facet  arthropathy has progressed. No new or progressive stenosis.   VISIT SUMMARIES:  MEDICATIONS Outpatient Medications Marked as Taking for the 02/20/22 encounter (Office Visit) with Gelene Mink, PA  Medication Sig  albuterol 90 mcg/actuation inhaler Inhale 2 inhalations into the lungs every 4 (four) hours as needed for Wheezing or Shortness of Breath  ARIPiprazole (ABILIFY) 10 MG tablet Take 10 mg by mouth once daily  busPIRone (BUSPAR) 10 MG tablet Take 10 mg by mouth 3 (three) times daily  diazePAM (VALIUM) 5 MG tablet 1-2 30 minutes before ESI  methocarbamoL (ROBAXIN) 500 MG tablet Take 500 mg by mouth 4 (four) times daily  pregabalin (LYRICA) 25 MG capsule Take 25 mg in the morning 25 mg in the afternoon and 100 mg at night  risperiDONE (RISPERDAL) 1 MG tablet Take 2 mg by mouth  nightly   ALLERGIES Allergies  Allergen Reactions  Pork/Porcine Containing Products Hives   EXAM   Vitals:  02/20/22 0902  BP: 108/77  Pulse: 72  Weight: (!) 118.8 kg (262 lb)  PainSc: 7  PainLoc: Leg   Body mass index is 39.84 kg/m.  GENERAL: Pleasant female, no distress. Normocephalic and atraumatic.  Baseline neurological exam  below was obtained at prior office visit. Changes from today's visit appear in bold.   EYES: PERRLA EOM's intact  MUSCULOSKELETAL: Bulk - Normal Tone - Normal Pronator Drift - Absent bilaterally. Ambulation - Gait and station is slow, antalgic, limping.  Romberg - absent  R/L 5/5 Shoulder abduction (deltoid/supraspinatus, axillary/suprascapular n, C5) 5/5 Elbow flexion (biceps brachii, musculoskeletal n, C5-6) 5/5 Elbow extension (triceps, radial n, C7) 5/5 Finger adduction (interossei, ulnar n, T1)  4/5 Hip flexion (iliopsoas, L1/L2) 4/5 Knee flexion (hamstrings, sciatic n, L5/S1)  4/5 Knee extension (quadriceps, femoral n, L3/4) 5/5 Ankle dorsiflexion (tibialis anterior, deep fibular n, L4/5) 5/5 Ankle plantarflexion (gastroc, tibial n, S1)  Exam limited by pain.   NEUROLOGICAL: MENTAL STATUS: Patient is oriented to person, place and time.  Short-term memory is intact Long-term memory is intact.  Attention span and concentration are intact.  Naming and repetition are intact. Comprehension is intact.  Expressive speech is intact.  Patient's fund of knowledge is within normal limits for educational level.  CRANIAL NERVES: Visual acuity and visual fields are intact  Extraocular muscles are intact  Facial sensation is intact bilaterally  Facial strength is intact bilaterally  Hearing is intact bilaterally  Palate elevates midline, normal phonation  Shoulder shrug strength is intact  Tongue protrudes midline   SENSATION: Pain and temperature (spinothalamic tracts) is normal. Position and vibration (dorsal columns) is  normal.  COORDINATION/CEREBELLAR: Finger to nose testing is intact.  REFLEXES:  Negative Hoffman's sign bilaterally.   PAST MEDICAL HISTORY Past Medical History:  Diagnosis Date  Asthma, unspecified asthma severity, unspecified whether complicated, unspecified whether persistent  Chest pain  Depression  ILD (interstitial lung disease) (CMS-HCC)  Major depressive disorder  Sickle cell trait (CMS-HCC)   PAST SURGICAL HISTORY Past Surgical History:  Procedure Laterality Date  dental surgery  LAPAROSCOPIC TUBAL LIGATION   PAIN:  Are you having pain? Yes: NPRS scale: 8/10 Pain location: R upper thigh Pain description: persistent Aggravating factors: standing/ walking Relieving factors: recliner/ medications  PRECAUTIONS: None  WEIGHT BEARING RESTRICTIONS: No  FALLS:  Has patient fallen in last 6 months? No  LIVING ENVIRONMENT: Lives with: lives with their family and lives with an adult companion Lives in: House/apartment Stairs: No Has following equipment at home: Single point cane, Tour manager, and Grab bars  OCCUPATION: Disability  PLOF: Independent  PATIENT GOALS: Improve walking/ safety.  Decrease low back pain.    NEXT MD VISIT: 06/08/22  OBJECTIVE:   DIAGNOSTIC FINDINGS:  CLINICAL DATA:  Chronic low back pain, unspecified laterality; right lumbar radiculopathy; spinal stenosis of lumbosacral region   EXAM: MRI LUMBAR SPINE WITHOUT CONTRAST   TECHNIQUE: Multiplanar, multisequence MR imaging of the lumbar spine was performed. No intravenous contrast was administered.   COMPARISON:  03/04/2018   FINDINGS: Segmentation:  Standard.   Alignment: Stable. Mild levocurvature. Trace anterolisthesis at L4-L5.   Vertebrae: Vertebral body heights are maintained. Mild degenerative marrow edema at L5-S1 eccentric to the left.   Conus medullaris and cauda equina: Conus extends to the L2-L3 level. Conus and cauda equina appear normal.   Paraspinal and  other soft tissues: Unremarkable.   Disc levels:   L1-L2:  No canal or foraminal stenosis.   L2-L3:  No canal or foraminal stenosis.   L3-L4:  Minor facet arthropathy.  No canal or foraminal stenosis.   L4-L5: Disc desiccation. Anterolisthesis with uncovering of disc. Right foraminal protrusion. Moderate facet arthropathy with ligamentum flavum infolding. No canal or left foraminal stenosis. Minor right  foraminal stenosis. Disc is in proximity to but does not definitely compress the exiting L4 nerve root.   L5-S1: Disc desiccation. Mild right and moderate left facet arthropathy. No canal or foraminal stenosis.   IMPRESSION: Lower lumbar degenerative changes as detailed above. Facet arthropathy has progressed. No new or progressive stenosis.     Electronically Signed   By: Guadlupe Spanish M.D.   On: 05/17/2021 13:59  PATIENT SURVEYS:  FOTO initial 45/ goal 54  SCREENING FOR RED FLAGS: Bowel or bladder incontinence: No Spinal tumors: No Cauda equina syndrome: No Compression fracture: No Abdominal aneurysm: No  COGNITION: Overall cognitive status: Within functional limits for tasks assessed     SENSATION: WFL  MUSCLE LENGTH: Hamstrings: Right 60 deg; Left 60 deg  (severe pain reported) Maisie Fus test: NT  (pain getting into position)  POSTURE: rounded shoulders, forward head, and flexed trunk   PALPATION: Generalized lumbar hypomobility/ tenderness with palpation.  LUMBAR ROM:   Stands in flexed posture (25 deg.)- pain limited with correction.  Limited/ poor standing tolerance and required 2 seated rest breaks during lumbar testing.   AROM eval  Flexion 25% limited   Extension 100% limited (pain)  Right lateral flexion 50% limited (pain)  Left lateral flexion 50% limited (pain)  Right rotation 50% limited (pain)  Left rotation 50% limited (pain)   (Blank rows = not tested)  LOWER EXTREMITY ROM:      B LE AROM WFL except hip extension (pain)  LOWER  EXTREMITY MMT:    MMT Right eval Left eval  Hip flexion 3+ (pain) 3+  Hip extension Unable to test Unable to test  Hip abduction 3 3+  Hip adduction 4 4  Hip internal rotation 4 4  Hip external rotation 4 4  Knee flexion 4 (LBP) 4  Knee extension 4 (LBP) 4  Ankle dorsiflexion    Ankle plantarflexion    Ankle inversion    Ankle eversion     (Blank rows = not tested)  LUMBAR SPECIAL TESTS:  Straight leg raise test: Positive and FABER test: Positive  FUNCTIONAL TESTS:  5 times sit to stand: TBD Berg Balance Scale: TBD  GAIT: Distance walked: in clinic Assistive device utilized: Single point cane  PT discussed use of rollator/ demonstrates Level of assistance: Modified independence Comments: Marked antalgic gait with limited hip/knee flexion and heavy B UE assist with SPC or rollator.  Poor endurance.  R LE muscle weakness/ fatigue reported.   7/22:  5xSTS: 24.56 seconds  TODAY'S TREATMENT:                                                                                                                              DATE: 07/30/2022  Subjective: Pt. reports 6/10 back pain prior to tx. Session.  Pt. Continues to use SPC and waiting to hear back from medical supply store about rollator.  No falls.     There.ex:  Nustep L5 10 min. B  UE/LE for LE strengthening and gentle lumbar mobility- discussed use of rollator for improved safety/ current HEP.  Discussed weekend activities.    Seated LAQ/ marching x 20 each LE with 3# AW.  Standing hamstring curls/ heel raises 20x (3# AW) in //-bars (mirror feedback).   Lateral stepping in // bars with 3# AW x 2 laps  Fwd/bwd walking in // bars with 3# AW x 2 laps   Walking partial lunges in //-bars 3 laps.  UE assist required.  Cuing to correct upright posture.    Walking in hallway/ standing tolerance reassessment in //-bars.    Neuro.mm.:  Walking in //-bars with 6" hurdles.  Working on consistent hip flexion/ step pattern/ heel strike.   Cuing to decrease hip circumduction.    Ascend stairs with recip. Pattern/ descending with step to pattern 3x.    Walking in hallway with rollator and moderate cuing for posture correction/ proper step pattern (1 lap in gym/ hallway).  Fatigue reported.     PATIENT EDUCATION:  Education details: Use of rollator/ upright posture Person educated: Patient Education method: Medical illustrator Education comprehension: verbalized understanding, returned demonstration, and needs further education  HOME EXERCISE PROGRAM: Access Code: [email or web address] URL: [email or web address] Date: 05/28/2022 Prepared by: Dorene Grebe  Exercises - Seated Long Arc Quad  - 2 x daily - 7 x weekly - 1 sets - 20 reps - Seated March  - 2 x daily - 7 x weekly - 1 sets - 20 reps - Seated Heel Toe Raises  - 2 x daily - 7 x weekly - 1 sets - 20 reps - Side Stepping with Counter Support  - 2 x daily - 7 x weekly - 1 sets - 20 reps - Sit to Stand with Counter Support  - 2 x daily - 7 x weekly - 1 sets - 10 reps  ASSESSMENT:  CLINICAL IMPRESSION:    Pt. Works had during tx. Session and focused on LE strengthening/ standing tolerance.  No LOB during tx. Session and pt. Benefits from use of rollator for safety/ decrease fall risk.  Patient's condition has the potential to improve in response to therapy. Maximum improvement is yet to be obtained. The anticipated improvement is attainable and reasonable in a generally predictable time.Patient will continue to benefit from skilled PT interventions to address remaining deficits to improve mobility with decreasing symptoms.  OBJECTIVE IMPAIRMENTS: Abnormal gait, decreased activity tolerance, decreased balance, decreased coordination, decreased endurance, decreased knowledge of use of DME, decreased mobility, difficulty walking, decreased ROM, decreased strength, decreased safety awareness, hypomobility, impaired flexibility, improper body mechanics, postural  dysfunction, and pain.   ACTIVITY LIMITATIONS: carrying, lifting, bending, sitting, standing, squatting, sleeping, stairs, transfers, bed mobility, bathing, toileting, dressing, and locomotion level  PARTICIPATION LIMITATIONS: meal prep, cleaning, laundry, driving, shopping, and community activity  PERSONAL FACTORS: Fitness and Past/current experiences are also affecting patient's functional outcome.   REHAB POTENTIAL: Good  CLINICAL DECISION MAKING: Unstable/unpredictable  EVALUATION COMPLEXITY: High   GOALS: Goals reviewed with patient? Yes  SHORT TERM GOALS: Target date: 07/31/22  Pt. Will be able to tolerate standing 10 minutes with no increase c/o low back pain to improve household tasks/ ADL.   Baseline:  Limited standing tolerance (>8/10 pain in back/ R upper thigh); 7/10: unable to tolerate standing x 10 minutes without increase in back pain (10/10) Goal status: Partially met  LONG TERM GOALS: Target date: 08/21/22  Pt. Will increase FOTO to 54 to improve pain-free mobility.   Baseline: initial  45; 7/10: 23/100 Goal status: IN PROGRESS  2.  Pt. Will complete Berg balance test and score >48/56 to decrease fall risk/ improve safety with daily activity/ walking.  Baseline: 7/10: 31/56.   Goal status: IN PROGRESS  3.  Pt. Will report <5/10 low back/ R upper thigh pain while walking.  Baseline: >8/10 pain; 7/10: 7/10 pain with ambulation Goal status: IN PROGRESS  4.  Pt. Able to ambulate with use of SPC and consistent 2-point gait pattern for 10 minutes with no increase c/o back pain.   Baseline: limited endurance/ flexed posture/ antalgic gait. 7/10: unable to ambulate >10 minutes without pain increasing to 10/10  Goal status: IN PROGRESS  PLAN:  PT FREQUENCY: 2x/week  PT DURATION: 6 weeks  PLANNED INTERVENTIONS: Therapeutic exercises, Therapeutic activity, Neuromuscular re-education, Balance training, Gait training, Patient/Family education, Self Care, Joint  mobilization, Stair training, Aquatic Therapy, Cryotherapy, Moist heat, Manual therapy, and Re-evaluation.  PLAN FOR NEXT SESSION:   Strength training/ progression.  Standing tolerance   [name], PT, DPT # [phone] Physical Therapist - [facility]   08/01/2022, 7:36 AM

## 2022-08-01 NOTE — Therapy (Signed)
OUTPATIENT PHYSICAL THERAPY THORACOLUMBAR TREATMENT  Patient Name: Lindsey Chavez MRN: [id number] DOB:[birth year], 52 y.o., female Today's Date: 08/01/2022  END OF SESSION:  PT End of Session - 08/01/22 0850     Visit Number 15    Number of Visits 25    Date for PT Re-Evaluation 08/21/22    Authorization Type [place] [employer]- [employer]    Authorization Time Period --    PT Start Time [phone]    PT Stop Time 0930    PT Time Calculation (min) 41 min    Activity Tolerance Patient tolerated treatment well;Patient limited by fatigue    Behavior During Therapy WFL for tasks assessed/performed            Past Medical History:  Diagnosis Date   Asthma    Depressive disorder    Past Surgical History:  Procedure Laterality Date   MOUTH SURGERY     TUBAL LIGATION     Patient Active Problem List   Diagnosis Date Noted   Lymphedema 12/19/2017   Pain in both lower extremities 08/19/2017   Bilateral lower extremity edema 08/19/2017    PCP: Emogene Morgan, MD  REFERRING PROVIDER: Morene Crocker, MD  REFERRING DIAG: Other abnormalities of gait and mobility    Chronic low back pain  Rationale for Evaluation and Treatment: Rehabilitation  THERAPY DIAG:  Gait difficulty  Other low back pain  ONSET DATE: Chronic  SUBJECTIVE:                                                                                                                                                                                           SUBJECTIVE STATEMENT: Pt. Reports chronic h/o low back pain due to scoliosis and bulging discs.  Pt. Has not worked since 2022 due to back pain and receives disability.  Pt. Reports no falls and no previous back surgeries.  Pt. Unable to drive due to back pain.  Pt. Takes Lyrica for pain and sleeps in recliner due to increase pain in bed.  Pt. Lives with 3 sons and requires SPC/ use of grab bar for safety.  Injection with Dr. Mariah Milling 6/24  PERTINENT HISTORY:   REFERRING PHYSICIAN: Aycock, Ngwe Achirimofo* PRIMARY CARE PHYSICIAN: Aycock, Ngwe Achirimofor, MD  IMPRESSION/PLAN  Lindsey Chavez is a 52 y.o. female presenting for evaluation of  NUMBNESS/ TINGLING/ BACK PAIN/ DIFFICULTY SLEEPING/ DEPRESSION  - Unchanged. - Patient with PMH of HTN, lymphedema, lumbar radiculopathy, with ongoing neuropathy pain. Taking Lyrica 25 mg twice daily and 100 mg at night -Labs from last visit showed A1c is in prediabetic range. Alk phos also slightly elevated. Follow up  with PCP. -Reviewed note from vascular recommending compression hose, elevating lower extremities, consider lymph follow-up, recommended ultrasound lower extremities. - Provided prescription for 3 point cane -Referral to gait and balance physical therapy. -Change Lyrica to 25 mg in the morning, 25 mg in the afternoon and 100 mg at night.  -Continue to stay physically active. -Can start vitamin D 1000 units once daily. -Referral for sleep study to evaluate daytime fatigue. -Continue following with vascular. Continue wearing compression hose and elevating legs. -Alpha-lipoic acid is a naturally occurring, biological antioxidant, which might help with many chronic diseases, mostly diabetic peripheral neuropathy but can help other types of neuropathies as well. Usually given 600 mg/day, may take 3-5 wks before some symptomatic relief. Diabetic patients may need to reduce their diabetic meds. It may exacerbate thiamine deficiency in pt's with chronic malnutrition e.g. alcoholism  Follow up in 2-64mo  Medications previously tried:  CHIEF COMPLAINT & HPI  Lindsey Chavez is a 52 y.o. female presenting for evaluation of: Chief Complaint  Patient presents with  Numbness  Tingling  Back Pain  Sleeping Problem  Depression   NUMBNESS/ TINGLING/ BACK PAIN/ DIFFICULTY SLEEPING/ DEPRESSION  Patient reports with ongoing numbness and tingling in bilateral legs. Notes upcoming [place] LE in April per vascular  recommendations. Following visit with vascular specialist, she was recommended to wear compression hose throughout the day and remove them before bed and to continue to elevate bilateral legs. Notes ongoing swelling in bilateral legs. States she continues to have more frequent numbness, tingling and pain in right leg.Taking Lyrica to 25 mg in the morning, 25 mg in the afternoon and 100 mg at night. He is tolerating this well. Feels this is helpful with symptoms. States continuing to see physiatry for injections in spine. Notes reducing salt intake and getting daily exercise. Notes she spoke with nutritionist and is working on weight loss. Notes ongoing difficulty falling asleep and staying asleep. Reports getting 5 hours of sleep at night. States occasional drowsiness during the day. Notes ongoing appointment with therapist. Reports current mood is stable but ongoing depression. Endorses fatigue and low energy. No recent falls but feels that balance is not doing well. Needs new prescription for cane.  DATA SUMMARY: 12/05/2021 EMG LOWERS  Impression: Abnormal study. 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   05/17/2021 MRI LUMBAR SPINE WO CONTRAST  IMPRESSION:  Lower lumbar degenerative changes as detailed above. Facet  arthropathy has progressed. No new or progressive stenosis.   VISIT SUMMARIES:  MEDICATIONS Outpatient Medications Marked as Taking for the 02/20/22 encounter (Office Visit) with Gelene Mink, PA  Medication Sig  albuterol 90 mcg/actuation inhaler Inhale 2 inhalations into the lungs every 4 (four) hours as needed for Wheezing or Shortness of Breath  ARIPiprazole (ABILIFY) 10 MG tablet Take 10 mg by mouth once daily  busPIRone (BUSPAR) 10 MG tablet Take 10 mg by mouth 3 (three) times daily  diazePAM (VALIUM) 5 MG tablet 1-2 30 minutes before ESI  methocarbamoL (ROBAXIN) 500 MG tablet Take 500 mg by  mouth 4 (four) times daily  pregabalin (LYRICA) 25 MG capsule Take 25 mg in the morning 25 mg in the afternoon and 100 mg at night  risperiDONE (RISPERDAL) 1 MG tablet Take 2 mg by mouth nightly   ALLERGIES Allergies  Allergen Reactions  Pork/Porcine Containing Products Hives   EXAM   Vitals:  02/20/22 0902  BP: 108/77  Pulse: 72  Weight: (!) 118.8 kg (  262 lb)  PainSc: 7  PainLoc: Leg   Body mass index is 39.84 kg/m.  GENERAL: Pleasant female, no distress. Normocephalic and atraumatic.  Baseline neurological exam below was obtained at prior office visit. Changes from today's visit appear in bold.   EYES: PERRLA EOM's intact  MUSCULOSKELETAL: Bulk - Normal Tone - Normal Pronator Drift - Absent bilaterally. Ambulation - Gait and station is slow, antalgic, limping.  Romberg - absent  R/L 5/5 Shoulder abduction (deltoid/supraspinatus, axillary/suprascapular n, C5) 5/5 Elbow flexion (biceps brachii, musculoskeletal n, C5-6) 5/5 Elbow extension (triceps, radial n, C7) 5/5 Finger adduction (interossei, ulnar n, T1)  4/5 Hip flexion (iliopsoas, L1/L2) 4/5 Knee flexion (hamstrings, sciatic n, L5/S1)  4/5 Knee extension (quadriceps, femoral n, L3/4) 5/5 Ankle dorsiflexion (tibialis anterior, deep fibular n, L4/5) 5/5 Ankle plantarflexion (gastroc, tibial n, S1)  Exam limited by pain.   NEUROLOGICAL: MENTAL STATUS: Patient is oriented to person, place and time.  Short-term memory is intact Long-term memory is intact.  Attention span and concentration are intact.  Naming and repetition are intact. Comprehension is intact.  Expressive speech is intact.  Patient's fund of knowledge is within normal limits for educational level.  CRANIAL NERVES: Visual acuity and visual fields are intact  Extraocular muscles are intact  Facial sensation is intact bilaterally  Facial strength is intact bilaterally  Hearing is intact bilaterally  Palate elevates midline, normal  phonation  Shoulder shrug strength is intact  Tongue protrudes midline   SENSATION: Pain and temperature (spinothalamic tracts) is normal. Position and vibration (dorsal columns) is normal.  COORDINATION/CEREBELLAR: Finger to nose testing is intact.  REFLEXES:  Negative Hoffman's sign bilaterally.   PAST MEDICAL HISTORY Past Medical History:  Diagnosis Date  Asthma, unspecified asthma severity, unspecified whether complicated, unspecified whether persistent  Chest pain  Depression  ILD (interstitial lung disease) (CMS-HCC)  Major depressive disorder  Sickle cell trait (CMS-HCC)   PAST SURGICAL HISTORY Past Surgical History:  Procedure Laterality Date  dental surgery  LAPAROSCOPIC TUBAL LIGATION   PAIN:  Are you having pain? Yes: NPRS scale: 8/10 Pain location: R upper thigh Pain description: persistent Aggravating factors: standing/ walking Relieving factors: recliner/ medications  PRECAUTIONS: None  WEIGHT BEARING RESTRICTIONS: No  FALLS: Has patient fallen in last 6 months? No  LIVING ENVIRONMENT: Lives with: lives with their family and lives with an adult companion Lives in: House/apartment Stairs: No Has following equipment at home: Single point cane, Tour manager, and Grab bars  OCCUPATION: Disability  PLOF: Independent  PATIENT GOALS: Improve walking/ safety.  Decrease low back pain.    NEXT MD VISIT: 06/08/22  OBJECTIVE:   DIAGNOSTIC FINDINGS:  CLINICAL DATA:  Chronic low back pain, unspecified laterality; right lumbar radiculopathy; spinal stenosis of lumbosacral region   EXAM: MRI LUMBAR SPINE WITHOUT CONTRAST   TECHNIQUE: Multiplanar, multisequence MR imaging of the lumbar spine was performed. No intravenous contrast was administered.   COMPARISON:  03/04/2018   FINDINGS: Segmentation:  Standard.   Alignment: Stable. Mild levocurvature. Trace anterolisthesis at L4-L5.   Vertebrae: Vertebral body heights are maintained. Mild  degenerative marrow edema at L5-S1 eccentric to the left.   Conus medullaris and cauda equina: Conus extends to the L2-L3 level. Conus and cauda equina appear normal.   Paraspinal and other soft tissues: Unremarkable.   Disc levels: L1-L2:  No canal or foraminal stenosis. L2-L3:  No canal or foraminal stenosis. L3-L4:  Minor facet arthropathy.  No canal or foraminal stenosis. L4-L5: Disc desiccation. Anterolisthesis with uncovering  of disc. Right foraminal protrusion. Moderate facet arthropathy withligamentum flavum infolding. No canal or left foraminal stenosis.Minor right foraminal stenosis. Disc is in proximity to but does not definitely compress the exiting L4 nerve root.L5-S1: Disc desiccation. Mild right and moderate left facetarthropathy. No canal or foraminal stenosis. IMPRESSION:Lower lumbar degenerative changes as detailed above. Facet arthropathy has progressed. No new or progressive stenosis.  PATIENT SURVEYS:  FOTO initial 45/ goal 75  SCREENING FOR RED FLAGS: Bowel or bladder incontinence: No Spinal tumors: No Cauda equina syndrome: No Compression fracture: No Abdominal aneurysm: No  COGNITION: Overall cognitive status: Within functional limits for tasks assessed     SENSATION: WFL  MUSCLE LENGTH: Hamstrings: Right 60 deg; Left 60 deg  (severe pain reported) Maisie Fus test: NT  (pain getting into position)  POSTURE: rounded shoulders, forward head, and flexed trunk   PALPATION: Generalized lumbar hypomobility/ tenderness with palpation.  LUMBAR ROM:   Stands in flexed posture (25 deg.)- pain limited with correction.  Limited/ poor standing tolerance and required 2 seated rest breaks during lumbar testing.   AROM eval  Flexion 25% limited   Extension 100% limited (pain)  Right lateral flexion 50% limited (pain)  Left lateral flexion 50% limited (pain)  Right rotation 50% limited (pain)  Left rotation 50% limited (pain)   (Blank rows = not tested)  LOWER  EXTREMITY ROM:      B LE AROM WFL except hip extension (pain)  LOWER EXTREMITY MMT:    MMT Right eval Left eval  Hip flexion 3+ (pain) 3+  Hip extension Unable to test Unable to test  Hip abduction 3 3+  Hip adduction 4 4  Hip internal rotation 4 4  Hip external rotation 4 4  Knee flexion 4 (LBP) 4  Knee extension 4 (LBP) 4  Ankle dorsiflexion    Ankle plantarflexion    Ankle inversion    Ankle eversion     (Blank rows = not tested)  LUMBAR SPECIAL TESTS:  Straight leg raise test: Positive and FABER test: Positive  FUNCTIONAL TESTS:  5 times sit to stand: TBD Berg Balance Scale: TBD  GAIT: Distance walked: in clinic Assistive device utilized: Single point cane  PT discussed use of rollator/ demonstrates Level of assistance: Modified independence Comments: Marked antalgic gait with limited hip/knee flexion and heavy B UE assist with SPC or rollator.  Poor endurance.  R LE muscle weakness/ fatigue reported.    TODAY'S TREATMENT:                                                                                                                              DATE: 08/01/2022  Subjective: Pt still waiting to hear from medical supply store about rollator.  Pt. reports 7/10 back pain on arrival. Otherwise no specific questions or concerns.    PAIN: 7/10 low back pain;   Ther-ex  Nustep L1-4 x 10 min. B UE/LE for LE strengthening and gentle lumbar mobility during  interval history;  Seated alternating LAQ x 15 BLE with 3# ankle weights (AW);   Lateral stepping in // bars with 3# AW x 6 lengths;  Fwd/bwd walking in // bars with 3# AW x 6 lengths;  Seated clams with green tband 2 x 15; Seated adductor ball squeeze 2 x 15; Walking partial lunges in //-bars 4 lengths.  UE assist required.  Cuing to correct upright posture.   Walking in hallway to work on endurance. Pt requires 1 seated rest break over approximately 200'.      PATIENT EDUCATION:  Education details: Use of  rollator/ upright posture Person educated: Patient Education method: Medical illustrator Education comprehension: verbalized understanding, returned demonstration, and needs further education  HOME EXERCISE PROGRAM: Access Code: [email or web address] URL: [email or web address] Date: 05/28/2022 Prepared by: Dorene Grebe  Exercises - Seated Long Arc Quad  - 2 x daily - 7 x weekly - 1 sets - 20 reps - Seated March  - 2 x daily - 7 x weekly - 1 sets - 20 reps - Seated Heel Toe Raises  - 2 x daily - 7 x weekly - 1 sets - 20 reps - Side Stepping with Counter Support  - 2 x daily - 7 x weekly - 1 sets - 20 reps - Sit to Stand with Counter Support  - 2 x daily - 7 x weekly - 1 sets - 10 reps  ASSESSMENT:  CLINICAL IMPRESSION:   Pt demonstrates excellent motivation during session. Focused mostly on strength and endurance training during visit today. She reports intermittent R posterior thigh pain during seated LAQ (pt performed cervical extension during knee extension to avoid excessive sciatic tension). Patient's condition has the potential to improve in response to therapy. Maximum improvement is yet to be obtained. The anticipated improvement is attainable and reasonable in a generally predictable time. Patient will continue to benefit from skilled PT interventions to address remaining deficits to improve mobility with decreasing symptoms.  OBJECTIVE IMPAIRMENTS: Abnormal gait, decreased activity tolerance, decreased balance, decreased coordination, decreased endurance, decreased knowledge of use of DME, decreased mobility, difficulty walking, decreased ROM, decreased strength, decreased safety awareness, hypomobility, impaired flexibility, improper body mechanics, postural dysfunction, and pain.   ACTIVITY LIMITATIONS: carrying, lifting, bending, sitting, standing, squatting, sleeping, stairs, transfers, bed mobility, bathing, toileting, dressing, and locomotion level  PARTICIPATION  LIMITATIONS: meal prep, cleaning, laundry, driving, shopping, and community activity  PERSONAL FACTORS: Fitness and Past/current experiences are also affecting patient's functional outcome.   REHAB POTENTIAL: Good  CLINICAL DECISION MAKING: Unstable/unpredictable  EVALUATION COMPLEXITY: High   GOALS: Goals reviewed with patient? Yes  SHORT TERM GOALS: Target date: 07/31/22  Pt. Will be able to tolerate standing 10 minutes with no increase c/o low back pain to improve household tasks/ ADL.   Baseline:  Limited standing tolerance (>8/10 pain in back/ R upper thigh); 7/10: unable to tolerate standing x 10 minutes without increase in back pain (10/10) Goal status: Partially met  LONG TERM GOALS: Target date: 08/21/22  Pt. Will increase FOTO to 54 to improve pain-free mobility.   Baseline: initial 45; 7/10: 23/100 Goal status: IN PROGRESS  2.  Pt. Will complete Berg balance test and score >48/56 to decrease fall risk/ improve safety with daily activity/ walking.  Baseline: 7/10: 31/56.   Goal status: IN PROGRESS  3.  Pt. Will report <5/10 low back/ R upper thigh pain while walking.  Baseline: >8/10 pain; 7/10: 7/10 pain with ambulation Goal status: IN PROGRESS  4.  Pt.  Able to ambulate with use of SPC and consistent 2-point gait pattern for 10 minutes with no increase c/o back pain.   Baseline: limited endurance/ flexed posture/ antalgic gait. 7/10: unable to ambulate >10 minutes without pain increasing to 10/10  Goal status: IN PROGRESS  PLAN:  PT FREQUENCY: 2x/week  PT DURATION: 6 weeks  PLANNED INTERVENTIONS: Therapeutic exercises, Therapeutic activity, Neuromuscular re-education, Balance training, Gait training, Patient/Family education, Self Care, Joint mobilization, Stair training, Aquatic Therapy, Cryotherapy, Moist heat, Manual therapy, and Re-evaluation.  PLAN FOR NEXT SESSION:  PT will call medical supply store about rollator?   Lynnea Maizes PT, DPT, GCS   Physical Therapist - [place]   08/01/2022, 10:53 AM

## 2022-08-01 NOTE — Therapy (Signed)
Lindsey Chavez, PT Physical Therapist Specialty: Physical Therapy   Therapy Deleted   Encounter Date: 07/30/2022   Deleted by Lindsey Chavez, PT 08/01/2022  8:19 AM     Expand All Collapse All[] Expand All by Default   OUTPATIENT PHYSICAL THERAPY THORACOLUMBAR TREATMENT   Patient Name: Lindsey Chavez MRN: [id number] DOB:1970-06-21, 52 y.o., female Today's Date: 07/30/2022   END OF SESSION:   PT End of Session - 08/01/22 0734       Visit Number 14     Number of Visits 25     Date for PT Re-Evaluation 08/21/22     PT Start Time 0848     PT Stop Time 0940     PT Time Calculation (min) 52 min                         Past Medical History:  Diagnosis Date   Asthma     Depressive disorder               Past Surgical History:  Procedure Laterality Date   MOUTH SURGERY       TUBAL LIGATION                Patient Active Problem List    Diagnosis Date Noted   Lymphedema 12/19/2017   Pain in both lower extremities 08/19/2017   Bilateral lower extremity edema 08/19/2017      PCP: Lindsey Morgan, MD   REFERRING PROVIDER: Morene Crocker, MD   REFERRING DIAG: Other abnormalities of gait and mobility                                  Chronic low back pain   Rationale for Evaluation and Treatment: Rehabilitation   THERAPY DIAG:  Gait difficulty   Other low back pain   Abnormal posture   Muscle weakness (generalized)   ONSET DATE: Chronic   SUBJECTIVE:                                                                                                                                                                                            SUBJECTIVE STATEMENT: Pt. Reports chronic h/o low back pain due to scoliosis and bulging discs.  Pt. Has not worked since 2022 due to back pain and receives disability.  Pt. Reports no falls and no previous back surgeries.  Pt. Unable to drive due to back pain.  Pt. Takes Lyrica for pain and sleeps in recliner  due to increase pain  in bed.  Pt. Lives with 3 sons and requires SPC/ use of grab bar for safety.  Injection with Lindsey Chavez 6/24   PERTINENT HISTORY:  REFERRING PHYSICIAN: Aycock, Ngwe Chavez* PRIMARY CARE PHYSICIAN: Aycock, Ngwe Achirimofor, MD  IMPRESSION/PLAN  Lindsey Chavez is a 52 y.o. female presenting for evaluation of  NUMBNESS/ TINGLING/ BACK PAIN/ DIFFICULTY SLEEPING/ DEPRESSION  - Unchanged. - Patient with PMH of HTN, lymphedema, lumbar radiculopathy, with ongoing neuropathy pain. Taking Lyrica 25 mg twice daily and 100 mg at night -Labs from last visit showed A1c is in prediabetic range. Alk phos also slightly elevated. Follow up with PCP. -Reviewed note from vascular recommending compression hose, elevating lower extremities, consider lymph follow-up, recommended ultrasound lower extremities. - Provided prescription for 3 point cane -Referral to gait and balance physical therapy. -Change Lyrica to 25 mg in the morning, 25 mg in the afternoon and 100 mg at night.  -Continue to stay physically active. -Can start vitamin D 1000 units once daily. -Referral for sleep study to evaluate daytime fatigue. -Continue following with vascular. Continue wearing compression hose and elevating legs. -Alpha-lipoic acid is a naturally occurring, biological antioxidant, which might help with many chronic diseases, mostly diabetic peripheral neuropathy but can help other types of neuropathies as well. Usually given 600 mg/day, may take 3-5 wks before some symptomatic relief. Diabetic patients may need to reduce their diabetic meds. It may exacerbate thiamine deficiency in pt's with chronic malnutrition e.g. alcoholism  Follow up in 2-69mo  Medications previously tried:  CHIEF COMPLAINT & HPI  Lindsey Chavez is a 52 y.o. female presenting for evaluation of: Chief Complaint  Patient presents with  Numbness  Tingling  Back Pain  Sleeping Problem  Depression   NUMBNESS/ TINGLING/ BACK  PAIN/ DIFFICULTY SLEEPING/ DEPRESSION  Patient reports with ongoing numbness and tingling in bilateral legs. Notes upcoming [place] LE in April per vascular recommendations. Following visit with vascular specialist, she was recommended to wear compression hose throughout the day and remove them before bed and to continue to elevate bilateral legs. Notes ongoing swelling in bilateral legs. States she continues to have more frequent numbness, tingling and pain in right leg.Taking Lyrica to 25 mg in the morning, 25 mg in the afternoon and 100 mg at night. He is tolerating this well. Feels this is helpful with symptoms. States continuing to see physiatry for injections in spine. Notes reducing salt intake and getting daily exercise. Notes she spoke with nutritionist and is working on weight loss. Notes ongoing difficulty falling asleep and staying asleep. Reports getting 5 hours of sleep at night. States occasional drowsiness during the day. Notes ongoing appointment with therapist. Reports current mood is stable but ongoing depression. Endorses fatigue and low energy. No recent falls but feels that balance is not doing well. Needs new prescription for cane.  DATA SUMMARY: 12/05/2021 EMG LOWERS  Impression: Abnormal study. 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   05/17/2021 MRI LUMBAR SPINE WO CONTRAST  IMPRESSION:  Lower lumbar degenerative changes as detailed above. Facet  arthropathy has progressed. No new or progressive stenosis.   VISIT SUMMARIES:  MEDICATIONS Outpatient Medications Marked as Taking for the 02/20/22 encounter (Office Visit) with Gelene Mink, PA  Medication Sig  albuterol 90 mcg/actuation inhaler Inhale 2 inhalations into the lungs every 4 (four) hours as needed for Wheezing or Shortness of Breath  ARIPiprazole (ABILIFY) 10 MG tablet Take 10 mg by mouth once daily  busPIRone (  BUSPAR) 10 MG tablet Take  10 mg by mouth 3 (three) times daily  diazePAM (VALIUM) 5 MG tablet 1-2 30 minutes before ESI  methocarbamoL (ROBAXIN) 500 MG tablet Take 500 mg by mouth 4 (four) times daily  pregabalin (LYRICA) 25 MG capsule Take 25 mg in the morning 25 mg in the afternoon and 100 mg at night  risperiDONE (RISPERDAL) 1 MG tablet Take 2 mg by mouth nightly   ALLERGIES Allergies  Allergen Reactions  Pork/Porcine Containing Products Hives   EXAM   Vitals:  02/20/22 0902  BP: 108/77  Pulse: 72  Weight: (!) 118.8 kg (262 lb)  PainSc: 7  PainLoc: Leg   Body mass index is 39.84 kg/m.  GENERAL: Pleasant female, no distress. Normocephalic and atraumatic.  Baseline neurological exam below was obtained at prior office visit. Changes from today's visit appear in bold.   EYES: PERRLA EOM's intact  MUSCULOSKELETAL: Bulk - Normal Tone - Normal Pronator Drift - Absent bilaterally. Ambulation - Gait and station is slow, antalgic, limping.  Romberg - absent  R/L 5/5 Shoulder abduction (deltoid/supraspinatus, axillary/suprascapular n, C5) 5/5 Elbow flexion (biceps brachii, musculoskeletal n, C5-6) 5/5 Elbow extension (triceps, radial n, C7) 5/5 Finger adduction (interossei, ulnar n, T1)  4/5 Hip flexion (iliopsoas, L1/L2) 4/5 Knee flexion (hamstrings, sciatic n, L5/S1)  4/5 Knee extension (quadriceps, femoral n, L3/4) 5/5 Ankle dorsiflexion (tibialis anterior, deep fibular n, L4/5) 5/5 Ankle plantarflexion (gastroc, tibial n, S1)  Exam limited by pain.   NEUROLOGICAL: MENTAL STATUS: Patient is oriented to person, place and time.  Short-term memory is intact Long-term memory is intact.  Attention span and concentration are intact.  Naming and repetition are intact. Comprehension is intact.  Expressive speech is intact.  Patient's fund of knowledge is within normal limits for educational level.  CRANIAL NERVES: Visual acuity and visual fields are intact  Extraocular muscles are intact   Facial sensation is intact bilaterally  Facial strength is intact bilaterally  Hearing is intact bilaterally  Palate elevates midline, normal phonation  Shoulder shrug strength is intact  Tongue protrudes midline   SENSATION: Pain and temperature (spinothalamic tracts) is normal. Position and vibration (dorsal columns) is normal.  COORDINATION/CEREBELLAR: Finger to nose testing is intact.  REFLEXES:  Negative Hoffman's sign bilaterally.   PAST MEDICAL HISTORY Past Medical History:  Diagnosis Date  Asthma, unspecified asthma severity, unspecified whether complicated, unspecified whether persistent  Chest pain  Depression  ILD (interstitial lung disease) (CMS-HCC)  Major depressive disorder  Sickle cell trait (CMS-HCC)   PAST SURGICAL HISTORY Past Surgical History:  Procedure Laterality Date  dental surgery  LAPAROSCOPIC TUBAL LIGATION    PAIN:  Are you having pain? Yes: NPRS scale: 8/10 Pain location: R upper thigh Pain description: persistent Aggravating factors: standing/ walking Relieving factors: recliner/ medications   PRECAUTIONS: None   WEIGHT BEARING RESTRICTIONS: No   FALLS:  Has patient fallen in last 6 months? No   LIVING ENVIRONMENT: Lives with: lives with their family and lives with an adult companion Lives in: House/apartment Stairs: No Has following equipment at home: Single point cane, Tour manager, and Grab bars   OCCUPATION: Disability   PLOF: Independent   PATIENT GOALS: Improve walking/ safety.  Decrease low back pain.     NEXT MD VISIT: 06/08/22   OBJECTIVE:    DIAGNOSTIC FINDINGS:  CLINICAL DATA:  Chronic low back pain, unspecified laterality; right lumbar radiculopathy; spinal stenosis of lumbosacral region   EXAM: MRI LUMBAR SPINE WITHOUT CONTRAST  TECHNIQUE: Multiplanar, multisequence MR imaging of the lumbar spine was performed. No intravenous contrast was administered.   COMPARISON:  03/04/2018    FINDINGS: Segmentation:  Standard.   Alignment: Stable. Mild levocurvature. Trace anterolisthesis at L4-L5.   Vertebrae: Vertebral body heights are maintained. Mild degenerative marrow edema at L5-S1 eccentric to the left.   Conus medullaris and cauda equina: Conus extends to the L2-L3 level. Conus and cauda equina appear normal.   Paraspinal and other soft tissues: Unremarkable.   Disc levels:   L1-L2:  No canal or foraminal stenosis.   L2-L3:  No canal or foraminal stenosis.   L3-L4:  Minor facet arthropathy.  No canal or foraminal stenosis.   L4-L5: Disc desiccation. Anterolisthesis with uncovering of disc. Right foraminal protrusion. Moderate facet arthropathy with ligamentum flavum infolding. No canal or left foraminal stenosis. Minor right foraminal stenosis. Disc is in proximity to but does not definitely compress the exiting L4 nerve root.   L5-S1: Disc desiccation. Mild right and moderate left facet arthropathy. No canal or foraminal stenosis.   IMPRESSION: Lower lumbar degenerative changes as detailed above. Facet arthropathy has progressed. No new or progressive stenosis.     Electronically Signed   By: Guadlupe Spanish M.D.   On: 05/17/2021 13:59   PATIENT SURVEYS:  FOTO initial 45/ goal 54   SCREENING FOR RED FLAGS: Bowel or bladder incontinence: No Spinal tumors: No Cauda equina syndrome: No Compression fracture: No Abdominal aneurysm: No   COGNITION: Overall cognitive status: Within functional limits for tasks assessed                          SENSATION: WFL   MUSCLE LENGTH: Hamstrings: Right 60 deg; Left 60 deg  (severe pain reported) Maisie Fus test: NT  (pain getting into position)   POSTURE: rounded shoulders, forward head, and flexed trunk    PALPATION: Generalized lumbar hypomobility/ tenderness with palpation.   LUMBAR ROM:    Stands in flexed posture (25 deg.)- pain limited with correction.  Limited/ poor standing tolerance and  required 2 seated rest breaks during lumbar testing.    AROM eval  Flexion 25% limited   Extension 100% limited (pain)  Right lateral flexion 50% limited (pain)  Left lateral flexion 50% limited (pain)  Right rotation 50% limited (pain)  Left rotation 50% limited (pain)   (Blank rows = not tested)   LOWER EXTREMITY ROM:                 B LE AROM WFL except hip extension (pain)   LOWER EXTREMITY MMT:     MMT Right eval Left eval  Hip flexion 3+ (pain) 3+  Hip extension Unable to test Unable to test  Hip abduction 3 3+  Hip adduction 4 4  Hip internal rotation 4 4  Hip external rotation 4 4  Knee flexion 4 (LBP) 4  Knee extension 4 (LBP) 4  Ankle dorsiflexion      Ankle plantarflexion      Ankle inversion      Ankle eversion       (Blank rows = not tested)   LUMBAR SPECIAL TESTS:  Straight leg raise test: Positive and FABER test: Positive   FUNCTIONAL TESTS:  5 times sit to stand: TBD Berg Balance Scale: TBD   GAIT: Distance walked: in clinic Assistive device utilized: Single point cane  PT discussed use of rollator/ demonstrates Level of assistance: Modified independence Comments: Marked antalgic gait with  limited hip/knee flexion and heavy B UE assist with SPC or rollator.  Poor endurance.  R LE muscle weakness/ fatigue reported.    7/22:  5xSTS: 24.56 seconds   TODAY'S TREATMENT:                                                                                                                              DATE: 07/30/2022   Subjective: Pt. reports 6/10 back pain prior to tx. Session.  Pt. Continues to use SPC and waiting to hear back from medical supply store about rollator.  No falls.      There.ex:   Nustep L5 10 min. B UE/LE for LE strengthening and gentle lumbar mobility- discussed use of rollator for improved safety/ current HEP.  Discussed weekend activities.     Seated LAQ/ marching x 20 each LE with 3# AW.  Standing hamstring curls/ heel raises 20x (3#  AW) in //-bars (mirror feedback).   Lateral stepping in // bars with 3# AW x 2 laps  Fwd/bwd walking in // bars with 3# AW x 2 laps    Walking partial lunges in //-bars 3 laps.  UE assist required.  Cuing to correct upright posture.     Walking in hallway/ standing tolerance reassessment in //-bars.     Neuro.mm.:   Walking in //-bars with 6" hurdles.  Working on consistent hip flexion/ step pattern/ heel strike.  Cuing to decrease hip circumduction.     Ascend stairs with recip. Pattern/ descending with step to pattern 3x.     Walking in hallway with rollator and moderate cuing for posture correction/ proper step pattern (1 lap in gym/ hallway).  Fatigue reported.        PATIENT EDUCATION:  Education details: Use of rollator/ upright posture Person educated: Patient Education method: Medical illustrator Education comprehension: verbalized understanding, returned demonstration, and needs further education   HOME EXERCISE PROGRAM: Access Code: [email or web address] URL: [email or web address] Date: 05/28/2022 Prepared by: Dorene Grebe   Exercises - Seated Long Arc Quad  - 2 x daily - 7 x weekly - 1 sets - 20 reps - Seated March  - 2 x daily - 7 x weekly - 1 sets - 20 reps - Seated Heel Toe Raises  - 2 x daily - 7 x weekly - 1 sets - 20 reps - Side Stepping with Counter Support  - 2 x daily - 7 x weekly - 1 sets - 20 reps - Sit to Stand with Counter Support  - 2 x daily - 7 x weekly - 1 sets - 10 reps   ASSESSMENT:   CLINICAL IMPRESSION:              Pt. Works had during tx. Session and focused on LE strengthening/ standing tolerance.  No LOB during tx. Session and pt. Benefits from use of rollator for safety/ decrease fall risk.  Patient's condition has the potential to improve in response to therapy.  Maximum improvement is yet to be obtained. The anticipated improvement is attainable and reasonable in a generally predictable time.Patient will continue to benefit  from skilled PT interventions to address remaining deficits to improve mobility with decreasing symptoms.   OBJECTIVE IMPAIRMENTS: Abnormal gait, decreased activity tolerance, decreased balance, decreased coordination, decreased endurance, decreased knowledge of use of DME, decreased mobility, difficulty walking, decreased ROM, decreased strength, decreased safety awareness, hypomobility, impaired flexibility, improper body mechanics, postural dysfunction, and pain.    ACTIVITY LIMITATIONS: carrying, lifting, bending, sitting, standing, squatting, sleeping, stairs, transfers, bed mobility, bathing, toileting, dressing, and locomotion level   PARTICIPATION LIMITATIONS: meal prep, cleaning, laundry, driving, shopping, and community activity   PERSONAL FACTORS: Fitness and Past/current experiences are also affecting patient's functional outcome.    REHAB POTENTIAL: Good   CLINICAL DECISION MAKING: Unstable/unpredictable   EVALUATION COMPLEXITY: High     GOALS: Goals reviewed with patient? Yes   SHORT TERM GOALS: Target date: 07/31/22   Pt. Will be able to tolerate standing 10 minutes with no increase c/o low back pain to improve household tasks/ ADL.   Baseline:  Limited standing tolerance (>8/10 pain in back/ R upper thigh); 7/10: unable to tolerate standing x 10 minutes without increase in back pain (10/10) Goal status: Partially met   LONG TERM GOALS: Target date: 08/21/22   Pt. Will increase FOTO to 54 to improve pain-free mobility.   Baseline: initial 45; 7/10: 23/100 Goal status: IN PROGRESS   2.  Pt. Will complete Berg balance test and score >48/56 to decrease fall risk/ improve safety with daily activity/ walking.  Baseline: 7/10: 31/56.   Goal status: IN PROGRESS   3.  Pt. Will report <5/10 low back/ R upper thigh pain while walking.  Baseline: >8/10 pain; 7/10: 7/10 pain with ambulation Goal status: IN PROGRESS   4.  Pt. Able to ambulate with use of SPC and consistent  2-point gait pattern for 10 minutes with no increase c/o back pain.   Baseline: limited endurance/ flexed posture/ antalgic gait. 7/10: unable to ambulate >10 minutes without pain increasing to 10/10  Goal status: IN PROGRESS   PLAN:   PT FREQUENCY: 2x/week   PT DURATION: 6 weeks   PLANNED INTERVENTIONS: Therapeutic exercises, Therapeutic activity, Neuromuscular re-education, Balance training, Gait training, Patient/Family education, Self Care, Joint mobilization, Stair training, Aquatic Therapy, Cryotherapy, Moist heat, Manual therapy, and Re-evaluation.   PLAN FOR NEXT SESSION:   Strength training/ progression.  Standing tolerance    Lindsey Chavez, PT, DPT # [phone] Physical Therapist - [facility]   08/01/2022, 7:36 AM

## 2022-08-06 ENCOUNTER — Ambulatory Visit: Payer: Medicaid Other | Admitting: Physical Therapy

## 2022-08-08 ENCOUNTER — Ambulatory Visit: Payer: Medicaid Other | Admitting: Physical Therapy

## 2022-08-08 ENCOUNTER — Encounter: Payer: Self-pay | Admitting: Physical Therapy

## 2022-08-08 DIAGNOSIS — M5459 Other low back pain: Secondary | ICD-10-CM

## 2022-08-08 DIAGNOSIS — R293 Abnormal posture: Secondary | ICD-10-CM

## 2022-08-08 DIAGNOSIS — R269 Unspecified abnormalities of gait and mobility: Secondary | ICD-10-CM | POA: Diagnosis not present

## 2022-08-08 DIAGNOSIS — M6281 Muscle weakness (generalized): Secondary | ICD-10-CM

## 2022-08-08 NOTE — Therapy (Signed)
OUTPATIENT PHYSICAL THERAPY THORACOLUMBAR TREATMENT  Patient Name: Lindsey Chavez MRN: [id number] DOB:13-May-1970, 52 y.o., female Today's Date: 08/08/2022  END OF SESSION:  PT End of Session - 08/08/22 1052     Visit Number 16    Number of Visits 25    Date for PT Re-Evaluation 08/21/22    Authorization Type [place] [employer]- [employer]    PT Start Time 1052    PT Stop Time 1148    PT Time Calculation (min) 56 min    Activity Tolerance Patient tolerated treatment well;Patient limited by fatigue    Behavior During Therapy WFL for tasks assessed/performed            Past Medical History:  Diagnosis Date   Asthma    Depressive disorder    Past Surgical History:  Procedure Laterality Date   MOUTH SURGERY     TUBAL LIGATION     Patient Active Problem List   Diagnosis Date Noted   Lymphedema 12/19/2017   Pain in both lower extremities 08/19/2017   Bilateral lower extremity edema 08/19/2017    PCP: Emogene Morgan, MD  REFERRING PROVIDER: Morene Crocker, MD  REFERRING DIAG: Other abnormalities of gait and mobility    Chronic low back pain  Rationale for Evaluation and Treatment: Rehabilitation  THERAPY DIAG:  Gait difficulty  Other low back pain  Abnormal posture  Muscle weakness (generalized)  ONSET DATE: Chronic  SUBJECTIVE:                                                                                                                                                                                           SUBJECTIVE STATEMENT: Pt. Reports chronic h/o low back pain due to scoliosis and bulging discs.  Pt. Has not worked since 2022 due to back pain and receives disability.  Pt. Reports no falls and no previous back surgeries.  Pt. Unable to drive due to back pain.  Pt. Takes Lyrica for pain and sleeps in recliner due to increase pain in bed.  Pt. Lives with 3 sons and requires SPC/ use of grab bar for safety.  Injection with Dr. Mariah Milling  6/24  PERTINENT HISTORY:  REFERRING PHYSICIAN: Aycock, Ngwe Achirimofo* PRIMARY CARE PHYSICIAN: Aycock, Ngwe Achirimofor, MD  IMPRESSION/PLAN  Lindsey Chavez is a 52 y.o. female presenting for evaluation of  NUMBNESS/ TINGLING/ BACK PAIN/ DIFFICULTY SLEEPING/ DEPRESSION  - Unchanged. - Patient with PMH of HTN, lymphedema, lumbar radiculopathy, with ongoing neuropathy pain. Taking Lyrica 25 mg twice daily and 100 mg at night -Labs from last visit showed A1c is in prediabetic range. Alk phos also slightly elevated. Follow up  with PCP. -Reviewed note from vascular recommending compression hose, elevating lower extremities, consider lymph follow-up, recommended ultrasound lower extremities. - Provided prescription for 3 point cane -Referral to gait and balance physical therapy. -Change Lyrica to 25 mg in the morning, 25 mg in the afternoon and 100 mg at night.  -Continue to stay physically active. -Can start vitamin D 1000 units once daily. -Referral for sleep study to evaluate daytime fatigue. -Continue following with vascular. Continue wearing compression hose and elevating legs. -Alpha-lipoic acid is a naturally occurring, biological antioxidant, which might help with many chronic diseases, mostly diabetic peripheral neuropathy but can help other types of neuropathies as well. Usually given 600 mg/day, may take 3-5 wks before some symptomatic relief. Diabetic patients may need to reduce their diabetic meds. It may exacerbate thiamine deficiency in pt's with chronic malnutrition e.g. alcoholism  Follow up in 2-66mo  Medications previously tried:  CHIEF COMPLAINT & HPI  Lindsey Chavez is a 52 y.o. female presenting for evaluation of: Chief Complaint  Patient presents with  Numbness  Tingling  Back Pain  Sleeping Problem  Depression   NUMBNESS/ TINGLING/ BACK PAIN/ DIFFICULTY SLEEPING/ DEPRESSION  Patient reports with ongoing numbness and tingling in bilateral legs. Notes upcoming [place] LE  in April per vascular recommendations. Following visit with vascular specialist, she was recommended to wear compression hose throughout the day and remove them before bed and to continue to elevate bilateral legs. Notes ongoing swelling in bilateral legs. States she continues to have more frequent numbness, tingling and pain in right leg.Taking Lyrica to 25 mg in the morning, 25 mg in the afternoon and 100 mg at night. He is tolerating this well. Feels this is helpful with symptoms. States continuing to see physiatry for injections in spine. Notes reducing salt intake and getting daily exercise. Notes she spoke with nutritionist and is working on weight loss. Notes ongoing difficulty falling asleep and staying asleep. Reports getting 5 hours of sleep at night. States occasional drowsiness during the day. Notes ongoing appointment with therapist. Reports current mood is stable but ongoing depression. Endorses fatigue and low energy. No recent falls but feels that balance is not doing well. Needs new prescription for cane.  DATA SUMMARY: 12/05/2021 EMG LOWERS  Impression: Abnormal study. 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   05/17/2021 MRI LUMBAR SPINE WO CONTRAST  IMPRESSION:  Lower lumbar degenerative changes as detailed above. Facet  arthropathy has progressed. No new or progressive stenosis.   VISIT SUMMARIES:  MEDICATIONS Outpatient Medications Marked as Taking for the 02/20/22 encounter (Office Visit) with Gelene Mink, PA  Medication Sig  albuterol 90 mcg/actuation inhaler Inhale 2 inhalations into the lungs every 4 (four) hours as needed for Wheezing or Shortness of Breath  ARIPiprazole (ABILIFY) 10 MG tablet Take 10 mg by mouth once daily  busPIRone (BUSPAR) 10 MG tablet Take 10 mg by mouth 3 (three) times daily  diazePAM (VALIUM) 5 MG tablet 1-2 30 minutes before ESI  methocarbamoL (ROBAXIN) 500 MG  tablet Take 500 mg by mouth 4 (four) times daily  pregabalin (LYRICA) 25 MG capsule Take 25 mg in the morning 25 mg in the afternoon and 100 mg at night  risperiDONE (RISPERDAL) 1 MG tablet Take 2 mg by mouth nightly   ALLERGIES Allergies  Allergen Reactions  Pork/Porcine Containing Products Hives   EXAM   Vitals:  02/20/22 0902  BP: 108/77  Pulse: 72  Weight: (!) 118.8 kg (  262 lb)  PainSc: 7  PainLoc: Leg   Body mass index is 39.84 kg/m.  GENERAL: Pleasant female, no distress. Normocephalic and atraumatic.  Baseline neurological exam below was obtained at prior office visit. Changes from today's visit appear in bold.   EYES: PERRLA EOM's intact  MUSCULOSKELETAL: Bulk - Normal Tone - Normal Pronator Drift - Absent bilaterally. Ambulation - Gait and station is slow, antalgic, limping.  Romberg - absent  R/L 5/5 Shoulder abduction (deltoid/supraspinatus, axillary/suprascapular n, C5) 5/5 Elbow flexion (biceps brachii, musculoskeletal n, C5-6) 5/5 Elbow extension (triceps, radial n, C7) 5/5 Finger adduction (interossei, ulnar n, T1)  4/5 Hip flexion (iliopsoas, L1/L2) 4/5 Knee flexion (hamstrings, sciatic n, L5/S1)  4/5 Knee extension (quadriceps, femoral n, L3/4) 5/5 Ankle dorsiflexion (tibialis anterior, deep fibular n, L4/5) 5/5 Ankle plantarflexion (gastroc, tibial n, S1)  Exam limited by pain.   NEUROLOGICAL: MENTAL STATUS: Patient is oriented to person, place and time.  Short-term memory is intact Long-term memory is intact.  Attention span and concentration are intact.  Naming and repetition are intact. Comprehension is intact.  Expressive speech is intact.  Patient's fund of knowledge is within normal limits for educational level.  CRANIAL NERVES: Visual acuity and visual fields are intact  Extraocular muscles are intact  Facial sensation is intact bilaterally  Facial strength is intact bilaterally  Hearing is intact bilaterally  Palate  elevates midline, normal phonation  Shoulder shrug strength is intact  Tongue protrudes midline   SENSATION: Pain and temperature (spinothalamic tracts) is normal. Position and vibration (dorsal columns) is normal.  COORDINATION/CEREBELLAR: Finger to nose testing is intact.  REFLEXES:  Negative Hoffman's sign bilaterally.   PAST MEDICAL HISTORY Past Medical History:  Diagnosis Date  Asthma, unspecified asthma severity, unspecified whether complicated, unspecified whether persistent  Chest pain  Depression  ILD (interstitial lung disease) (CMS-HCC)  Major depressive disorder  Sickle cell trait (CMS-HCC)   PAST SURGICAL HISTORY Past Surgical History:  Procedure Laterality Date  dental surgery  LAPAROSCOPIC TUBAL LIGATION   PAIN:  Are you having pain? Yes: NPRS scale: 8/10 Pain location: R upper thigh Pain description: persistent Aggravating factors: standing/ walking Relieving factors: recliner/ medications  PRECAUTIONS: None  WEIGHT BEARING RESTRICTIONS: No  FALLS: Has patient fallen in last 6 months? No  LIVING ENVIRONMENT: Lives with: lives with their family and lives with an adult companion Lives in: House/apartment Stairs: No Has following equipment at home: Single point cane, Tour manager, and Grab bars  OCCUPATION: Disability  PLOF: Independent  PATIENT GOALS: Improve walking/ safety.  Decrease low back pain.    NEXT MD VISIT: 06/08/22  OBJECTIVE:   DIAGNOSTIC FINDINGS:  CLINICAL DATA:  Chronic low back pain, unspecified laterality; right lumbar radiculopathy; spinal stenosis of lumbosacral region   EXAM: MRI LUMBAR SPINE WITHOUT CONTRAST   TECHNIQUE: Multiplanar, multisequence MR imaging of the lumbar spine was performed. No intravenous contrast was administered.   COMPARISON:  03/04/2018   FINDINGS: Segmentation:  Standard.   Alignment: Stable. Mild levocurvature. Trace anterolisthesis at L4-L5.   Vertebrae: Vertebral body heights  are maintained. Mild degenerative marrow edema at L5-S1 eccentric to the left.   Conus medullaris and cauda equina: Conus extends to the L2-L3 level. Conus and cauda equina appear normal.   Paraspinal and other soft tissues: Unremarkable.   Disc levels: L1-L2:  No canal or foraminal stenosis. L2-L3:  No canal or foraminal stenosis. L3-L4:  Minor facet arthropathy.  No canal or foraminal stenosis. L4-L5: Disc desiccation. Anterolisthesis with uncovering  of disc. Right foraminal protrusion. Moderate facet arthropathy withligamentum flavum infolding. No canal or left foraminal stenosis.Minor right foraminal stenosis. Disc is in proximity to but does not definitely compress the exiting L4 nerve root.L5-S1: Disc desiccation. Mild right and moderate left facetarthropathy. No canal or foraminal stenosis. IMPRESSION:Lower lumbar degenerative changes as detailed above. Facet arthropathy has progressed. No new or progressive stenosis.  PATIENT SURVEYS:  FOTO initial 45/ goal 12  SCREENING FOR RED FLAGS: Bowel or bladder incontinence: No Spinal tumors: No Cauda equina syndrome: No Compression fracture: No Abdominal aneurysm: No  COGNITION: Overall cognitive status: Within functional limits for tasks assessed     SENSATION: WFL  MUSCLE LENGTH: Hamstrings: Right 60 deg; Left 60 deg  (severe pain reported) Maisie Fus test: NT  (pain getting into position)  POSTURE: rounded shoulders, forward head, and flexed trunk   PALPATION: Generalized lumbar hypomobility/ tenderness with palpation.  LUMBAR ROM:   Stands in flexed posture (25 deg.)- pain limited with correction.  Limited/ poor standing tolerance and required 2 seated rest breaks during lumbar testing.   AROM eval  Flexion 25% limited   Extension 100% limited (pain)  Right lateral flexion 50% limited (pain)  Left lateral flexion 50% limited (pain)  Right rotation 50% limited (pain)  Left rotation 50% limited (pain)   (Blank rows = not  tested)  LOWER EXTREMITY ROM:      B LE AROM WFL except hip extension (pain)  LOWER EXTREMITY MMT:    MMT Right eval Left eval  Hip flexion 3+ (pain) 3+  Hip extension Unable to test Unable to test  Hip abduction 3 3+  Hip adduction 4 4  Hip internal rotation 4 4  Hip external rotation 4 4  Knee flexion 4 (LBP) 4  Knee extension 4 (LBP) 4  Ankle dorsiflexion    Ankle plantarflexion    Ankle inversion    Ankle eversion     (Blank rows = not tested)  LUMBAR SPECIAL TESTS:  Straight leg raise test: Positive and FABER test: Positive  FUNCTIONAL TESTS:  5 times sit to stand: TBD Berg Balance Scale: TBD  GAIT: Distance walked: in clinic Assistive device utilized: Single point cane  PT discussed use of rollator/ demonstrates Level of assistance: Modified independence Comments: Marked antalgic gait with limited hip/knee flexion and heavy B UE assist with SPC or rollator.  Poor endurance.  R LE muscle weakness/ fatigue reported.    TODAY'S TREATMENT:                                                                                                                              DATE: 08/08/2022  Subjective: Pt. reports 7/10 back pain on arrival. Otherwise no specific questions or concerns.   PAIN: 7/10 low back pain;   Ther-ex   Nustep L4 x 10 min. B UE/LE for LE strengthening and gentle lumbar mobility during interval history;   Seated alternating LAQ x 15 BLE with 3#  ankle weights (AW);   Lateral stepping in // bars with 3# AW x 6 lengths;  Fwd/bwd walking in // bars with 3# AW x 6 lengths;  Seated clams with blue tband 2 x 15; Seated marches with blue tband 2 x 15; Seated adductor ball squeeze 2 x 15; Walking partial lunges in //-bars 4 lengths.  UE assist required.  Cuing to correct upright posture.   2 bouts of walking in hallway to work on endurance. 1st with 3# AW, second with no AW. Pt requires 1 seated rest break over approximately 200'.      PATIENT EDUCATION:   Education details: Use of rollator/ upright posture Person educated: Patient Education method: Medical illustrator Education comprehension: verbalized understanding, returned demonstration, and needs further education  HOME EXERCISE PROGRAM: Access Code: [email or web address] URL: [email or web address] Date: 05/28/2022 Prepared by: Dorene Grebe  Exercises - Seated Long Arc Quad  - 2 x daily - 7 x weekly - 1 sets - 20 reps - Seated March  - 2 x daily - 7 x weekly - 1 sets - 20 reps - Seated Heel Toe Raises  - 2 x daily - 7 x weekly - 1 sets - 20 reps - Side Stepping with Counter Support  - 2 x daily - 7 x weekly - 1 sets - 20 reps - Sit to Stand with Counter Support  - 2 x daily - 7 x weekly - 1 sets - 10 reps  ASSESSMENT:  CLINICAL IMPRESSION:   Today's session focused on improving walking tolerance and increasing LE strength/endurance. Pt reports onset of low back pain with prolonged standing/walking which subsides after short periods of seated rest. Pt requires occasional cues for upright posture while walking as she prefers to walk with trunk flexed. Pt able to ambulate in // bars with no AD or significant use of UE's to assist; used rollator for walking outside // bars.  Pt. Received rollator for home use and will bring to PT next session.  Patient will continue to benefit from skilled PT interventions to address remaining deficits to improve mobility with decreasing symptoms.  OBJECTIVE IMPAIRMENTS: Abnormal gait, decreased activity tolerance, decreased balance, decreased coordination, decreased endurance, decreased knowledge of use of DME, decreased mobility, difficulty walking, decreased ROM, decreased strength, decreased safety awareness, hypomobility, impaired flexibility, improper body mechanics, postural dysfunction, and pain.   ACTIVITY LIMITATIONS: carrying, lifting, bending, sitting, standing, squatting, sleeping, stairs, transfers, bed mobility, bathing,  toileting, dressing, and locomotion level  PARTICIPATION LIMITATIONS: meal prep, cleaning, laundry, driving, shopping, and community activity  PERSONAL FACTORS: Fitness and Past/current experiences are also affecting patient's functional outcome.   REHAB POTENTIAL: Good  CLINICAL DECISION MAKING: Unstable/unpredictable  EVALUATION COMPLEXITY: High   GOALS: Goals reviewed with patient? Yes  SHORT TERM GOALS: Target date: 07/31/22  Pt. Will be able to tolerate standing 10 minutes with no increase c/o low back pain to improve household tasks/ ADL.   Baseline:  Limited standing tolerance (>8/10 pain in back/ R upper thigh); 7/10: unable to tolerate standing x 10 minutes without increase in back pain (10/10) Goal status: Partially met  LONG TERM GOALS: Target date: 08/21/22  Pt. Will increase FOTO to 54 to improve pain-free mobility.   Baseline: initial 45; 7/10: 23/100 Goal status: IN PROGRESS  2.  Pt. Will complete Berg balance test and score >48/56 to decrease fall risk/ improve safety with daily activity/ walking.  Baseline: 7/10: 31/56.   Goal status: IN PROGRESS  3.  Pt. Will report <5/10  low back/ R upper thigh pain while walking.  Baseline: >8/10 pain; 7/10: 7/10 pain with ambulation Goal status: IN PROGRESS  4.  Pt. Able to ambulate with use of SPC and consistent 2-point gait pattern for 10 minutes with no increase c/o back pain.   Baseline: limited endurance/ flexed posture/ antalgic gait. 7/10: unable to ambulate >10 minutes without pain increasing to 10/10  Goal status: IN PROGRESS  PLAN:  PT FREQUENCY: 2x/week  PT DURATION: 6 weeks  PLANNED INTERVENTIONS: Therapeutic exercises, Therapeutic activity, Neuromuscular re-education, Balance training, Gait training, Patient/Family education, Self Care, Joint mobilization, Stair training, Aquatic Therapy, Cryotherapy, Moist heat, Manual therapy, and Re-evaluation.  PLAN FOR NEXT SESSION:  LE strengthening/ increase  walking endurance.  [name], PT, DPT # [phone] Physical Therapist - [facility]   08/08/2022, 1:28 PM

## 2022-08-13 ENCOUNTER — Ambulatory Visit: Payer: Medicaid Other | Attending: Neurology | Admitting: Physical Therapy

## 2022-08-13 DIAGNOSIS — R269 Unspecified abnormalities of gait and mobility: Secondary | ICD-10-CM | POA: Insufficient documentation

## 2022-08-13 DIAGNOSIS — M5459 Other low back pain: Secondary | ICD-10-CM | POA: Insufficient documentation

## 2022-08-13 DIAGNOSIS — M6281 Muscle weakness (generalized): Secondary | ICD-10-CM | POA: Insufficient documentation

## 2022-08-13 DIAGNOSIS — R293 Abnormal posture: Secondary | ICD-10-CM | POA: Insufficient documentation

## 2022-08-15 ENCOUNTER — Ambulatory Visit: Payer: Medicaid Other | Admitting: Physical Therapy

## 2022-08-15 ENCOUNTER — Encounter: Payer: Self-pay | Admitting: Physical Therapy

## 2022-08-15 DIAGNOSIS — M6281 Muscle weakness (generalized): Secondary | ICD-10-CM

## 2022-08-15 DIAGNOSIS — M5459 Other low back pain: Secondary | ICD-10-CM | POA: Diagnosis present

## 2022-08-15 DIAGNOSIS — R293 Abnormal posture: Secondary | ICD-10-CM

## 2022-08-15 DIAGNOSIS — R269 Unspecified abnormalities of gait and mobility: Secondary | ICD-10-CM | POA: Diagnosis not present

## 2022-08-15 NOTE — Therapy (Signed)
OUTPATIENT PHYSICAL THERAPY THORACOLUMBAR TREATMENT  Patient Name: Lindsey Chavez MRN: [id number] DOB:[birth year], 52 y.o., female Today's Date: 08/15/2022  END OF SESSION:  PT End of Session - 08/15/22 1241     Visit Number 17    Number of Visits 25    Date for PT Re-Evaluation 08/21/22    PT Start Time 1005    PT Stop Time 1052    PT Time Calculation (min) 47 min    Activity Tolerance Patient limited by pain    Behavior During Therapy [facility] for tasks assessed/performed             Past Medical History:  Diagnosis Date   Asthma    Depressive disorder    Past Surgical History:  Procedure Laterality Date   MOUTH SURGERY     TUBAL LIGATION     Patient Active Problem List   Diagnosis Date Noted   Lymphedema 12/19/2017   Pain in both lower extremities 08/19/2017   Bilateral lower extremity edema 08/19/2017    PCP: Emogene Morgan, MD  REFERRING PROVIDER: Morene Crocker, MD  REFERRING DIAG: Other abnormalities of gait and mobility    Chronic low back pain  Rationale for Evaluation and Treatment: Rehabilitation  THERAPY DIAG:  Gait difficulty  Other low back pain  Abnormal posture  Muscle weakness (generalized)  ONSET DATE: Chronic  SUBJECTIVE:                                                                                                                                                                                           SUBJECTIVE STATEMENT: Pt. Reports chronic h/o low back pain due to scoliosis and bulging discs.  Pt. Has not worked since 2022 due to back pain and receives disability.  Pt. Reports no falls and no previous back surgeries.  Pt. Unable to drive due to back pain.  Pt. Takes Lyrica for pain and sleeps in recliner due to increase pain in bed.  Pt. Lives with 3 sons and requires SPC/ use of grab bar for safety.  Injection with Dr. Mariah Milling 6/24  PERTINENT HISTORY:  REFERRING PHYSICIAN: Aycock, Ngwe Achirimofo* PRIMARY CARE PHYSICIAN:  Aycock, Ngwe Achirimofor, MD  IMPRESSION/PLAN  Ms. Geraty is a 52 y.o. female presenting for evaluation of  NUMBNESS/ TINGLING/ BACK PAIN/ DIFFICULTY SLEEPING/ DEPRESSION  - Unchanged. - Patient with PMH of HTN, lymphedema, lumbar radiculopathy, with ongoing neuropathy pain. Taking Lyrica 25 mg twice daily and 100 mg at night -Labs from last visit showed A1c is in prediabetic range. Alk phos also slightly elevated. Follow up with PCP. -Reviewed note from vascular recommending compression hose, elevating lower  extremities, consider lymph follow-up, recommended ultrasound lower extremities. - Provided prescription for 3 point cane -Referral to gait and balance physical therapy. -Change Lyrica to 25 mg in the morning, 25 mg in the afternoon and 100 mg at night.  -Continue to stay physically active. -Can start vitamin D 1000 units once daily. -Referral for sleep study to evaluate daytime fatigue. -Continue following with vascular. Continue wearing compression hose and elevating legs. -Alpha-lipoic acid is a naturally occurring, biological antioxidant, which might help with many chronic diseases, mostly diabetic peripheral neuropathy but can help other types of neuropathies as well. Usually given 600 mg/day, may take 3-5 wks before some symptomatic relief. Diabetic patients may need to reduce their diabetic meds. It may exacerbate thiamine deficiency in pt's with chronic malnutrition e.g. alcoholism  Follow up in 2-64mo  Medications previously tried:  CHIEF COMPLAINT & HPI  Ms. Svatos is a 52 y.o. female presenting for evaluation of: Chief Complaint  Patient presents with  Numbness  Tingling  Back Pain  Sleeping Problem  Depression   NUMBNESS/ TINGLING/ BACK PAIN/ DIFFICULTY SLEEPING/ DEPRESSION  Patient reports with ongoing numbness and tingling in bilateral legs. Notes upcoming [place] LE in April per vascular recommendations. Following visit with vascular specialist, she was recommended  to wear compression hose throughout the day and remove them before bed and to continue to elevate bilateral legs. Notes ongoing swelling in bilateral legs. States she continues to have more frequent numbness, tingling and pain in right leg.Taking Lyrica to 25 mg in the morning, 25 mg in the afternoon and 100 mg at night. He is tolerating this well. Feels this is helpful with symptoms. States continuing to see physiatry for injections in spine. Notes reducing salt intake and getting daily exercise. Notes she spoke with nutritionist and is working on weight loss. Notes ongoing difficulty falling asleep and staying asleep. Reports getting 5 hours of sleep at night. States occasional drowsiness during the day. Notes ongoing appointment with therapist. Reports current mood is stable but ongoing depression. Endorses fatigue and low energy. No recent falls but feels that balance is not doing well. Needs new prescription for cane.  DATA SUMMARY: 12/05/2021 EMG LOWERS  Impression: Abnormal study. 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   05/17/2021 MRI LUMBAR SPINE WO CONTRAST  IMPRESSION:  Lower lumbar degenerative changes as detailed above. Facet  arthropathy has progressed. No new or progressive stenosis.   VISIT SUMMARIES:  MEDICATIONS Outpatient Medications Marked as Taking for the 02/20/22 encounter (Office Visit) with Gelene Mink, PA  Medication Sig  albuterol 90 mcg/actuation inhaler Inhale 2 inhalations into the lungs every 4 (four) hours as needed for Wheezing or Shortness of Breath  ARIPiprazole (ABILIFY) 10 MG tablet Take 10 mg by mouth once daily  busPIRone (BUSPAR) 10 MG tablet Take 10 mg by mouth 3 (three) times daily  diazePAM (VALIUM) 5 MG tablet 1-2 30 minutes before ESI  methocarbamoL (ROBAXIN) 500 MG tablet Take 500 mg by mouth 4 (four) times daily  pregabalin (LYRICA) 25 MG capsule Take 25 mg in the  morning 25 mg in the afternoon and 100 mg at night  risperiDONE (RISPERDAL) 1 MG tablet Take 2 mg by mouth nightly   ALLERGIES Allergies  Allergen Reactions  Pork/Porcine Containing Products Hives   EXAM   Vitals:  02/20/22 0902  BP: 108/77  Pulse: 72  Weight: (!) 118.8 kg (262 lb)  PainSc: 7  PainLoc: Leg   Body  mass index is 39.84 kg/m.  GENERAL: Pleasant female, no distress. Normocephalic and atraumatic.  Baseline neurological exam below was obtained at prior office visit. Changes from today's visit appear in bold.   EYES: PERRLA EOM's intact  MUSCULOSKELETAL: Bulk - Normal Tone - Normal Pronator Drift - Absent bilaterally. Ambulation - Gait and station is slow, antalgic, limping.  Romberg - absent  R/L 5/5 Shoulder abduction (deltoid/supraspinatus, axillary/suprascapular n, C5) 5/5 Elbow flexion (biceps brachii, musculoskeletal n, C5-6) 5/5 Elbow extension (triceps, radial n, C7) 5/5 Finger adduction (interossei, ulnar n, T1)  4/5 Hip flexion (iliopsoas, L1/L2) 4/5 Knee flexion (hamstrings, sciatic n, L5/S1)  4/5 Knee extension (quadriceps, femoral n, L3/4) 5/5 Ankle dorsiflexion (tibialis anterior, deep fibular n, L4/5) 5/5 Ankle plantarflexion (gastroc, tibial n, S1)  Exam limited by pain.   NEUROLOGICAL: MENTAL STATUS: Patient is oriented to person, place and time.  Short-term memory is intact Long-term memory is intact.  Attention span and concentration are intact.  Naming and repetition are intact. Comprehension is intact.  Expressive speech is intact.  Patient's fund of knowledge is within normal limits for educational level.  CRANIAL NERVES: Visual acuity and visual fields are intact  Extraocular muscles are intact  Facial sensation is intact bilaterally  Facial strength is intact bilaterally  Hearing is intact bilaterally  Palate elevates midline, normal phonation  Shoulder shrug strength is intact  Tongue protrudes midline    SENSATION: Pain and temperature (spinothalamic tracts) is normal. Position and vibration (dorsal columns) is normal.  COORDINATION/CEREBELLAR: Finger to nose testing is intact.  REFLEXES:  Negative Hoffman's sign bilaterally.   PAST MEDICAL HISTORY Past Medical History:  Diagnosis Date  Asthma, unspecified asthma severity, unspecified whether complicated, unspecified whether persistent  Chest pain  Depression  ILD (interstitial lung disease) (CMS-HCC)  Major depressive disorder  Sickle cell trait (CMS-HCC)   PAST SURGICAL HISTORY Past Surgical History:  Procedure Laterality Date  dental surgery  LAPAROSCOPIC TUBAL LIGATION   PAIN:  Are you having pain? Yes: NPRS scale: 8/10 Pain location: R upper thigh Pain description: persistent Aggravating factors: standing/ walking Relieving factors: recliner/ medications  PRECAUTIONS: None  WEIGHT BEARING RESTRICTIONS: No  FALLS: Has patient fallen in last 6 months? No  LIVING ENVIRONMENT: Lives with: lives with their family and lives with an adult companion Lives in: House/apartment Stairs: No Has following equipment at home: Single point cane, Tour manager, and Grab bars  OCCUPATION: Disability  PLOF: Independent  PATIENT GOALS: Improve walking/ safety.  Decrease low back pain.    NEXT MD VISIT: 06/08/22  OBJECTIVE:   DIAGNOSTIC FINDINGS:  CLINICAL DATA:  Chronic low back pain, unspecified laterality; right lumbar radiculopathy; spinal stenosis of lumbosacral region   EXAM: MRI LUMBAR SPINE WITHOUT CONTRAST   TECHNIQUE: Multiplanar, multisequence MR imaging of the lumbar spine was performed. No intravenous contrast was administered.   COMPARISON:  03/04/2018   FINDINGS: Segmentation:  Standard.   Alignment: Stable. Mild levocurvature. Trace anterolisthesis at L4-L5.   Vertebrae: Vertebral body heights are maintained. Mild degenerative marrow edema at L5-S1 eccentric to the left.   Conus  medullaris and cauda equina: Conus extends to the L2-L3 level. Conus and cauda equina appear normal.   Paraspinal and other soft tissues: Unremarkable.   Disc levels: L1-L2:  No canal or foraminal stenosis. L2-L3:  No canal or foraminal stenosis. L3-L4:  Minor facet arthropathy.  No canal or foraminal stenosis. L4-L5: Disc desiccation. Anterolisthesis with uncovering of disc. Right foraminal protrusion. Moderate facet arthropathy withligamentum flavum infolding.  No canal or left foraminal stenosis.Minor right foraminal stenosis. Disc is in proximity to but does not definitely compress the exiting L4 nerve root.L5-S1: Disc desiccation. Mild right and moderate left facetarthropathy. No canal or foraminal stenosis. IMPRESSION:Lower lumbar degenerative changes as detailed above. Facet arthropathy has progressed. No new or progressive stenosis.  PATIENT SURVEYS:  FOTO initial 45/ goal 42  SCREENING FOR RED FLAGS: Bowel or bladder incontinence: No Spinal tumors: No Cauda equina syndrome: No Compression fracture: No Abdominal aneurysm: No  COGNITION: Overall cognitive status: Within functional limits for tasks assessed     SENSATION: WFL  MUSCLE LENGTH: Hamstrings: Right 60 deg; Left 60 deg  (severe pain reported) Maisie Fus test: NT  (pain getting into position)  POSTURE: rounded shoulders, forward head, and flexed trunk   PALPATION: Generalized lumbar hypomobility/ tenderness with palpation.  LUMBAR ROM:   Stands in flexed posture (25 deg.)- pain limited with correction.  Limited/ poor standing tolerance and required 2 seated rest breaks during lumbar testing.   AROM eval  Flexion 25% limited   Extension 100% limited (pain)  Right lateral flexion 50% limited (pain)  Left lateral flexion 50% limited (pain)  Right rotation 50% limited (pain)  Left rotation 50% limited (pain)   (Blank rows = not tested)  LOWER EXTREMITY ROM:      B LE AROM WFL except hip extension (pain)  LOWER  EXTREMITY MMT:    MMT Right eval Left eval  Hip flexion 3+ (pain) 3+  Hip extension Unable to test Unable to test  Hip abduction 3 3+  Hip adduction 4 4  Hip internal rotation 4 4  Hip external rotation 4 4  Knee flexion 4 (LBP) 4  Knee extension 4 (LBP) 4  Ankle dorsiflexion    Ankle plantarflexion    Ankle inversion    Ankle eversion     (Blank rows = not tested)  LUMBAR SPECIAL TESTS:  Straight leg raise test: Positive and FABER test: Positive  FUNCTIONAL TESTS:  5 times sit to stand: TBD Berg Balance Scale: TBD  GAIT: Distance walked: in clinic Assistive device utilized: Single point cane  PT discussed use of rollator/ demonstrates Level of assistance: Modified independence Comments: Marked antalgic gait with limited hip/knee flexion and heavy B UE assist with SPC or rollator.  Poor endurance.  R LE muscle weakness/ fatigue reported.    TODAY'S TREATMENT:                                                                                                                              DATE: 08/15/2022  Subjective: Pt says she feels "so-so" this morning; low back pain is 6/10. Pt also reports having some R posterior thigh pain since this morning, 8/10. She is unable to recall any aggravating motions that would have caused her thigh pain.  Pt. Did arrive to PT with use of new rollator.    Ther-ex   Nustep L5 x 12 min. B  UE/LE for LE strengthening and gentle lumbar mobility  Supine stretching of R hamstring x 6 mins;  Supine SLR: 1x15 each leg - R posterior thigh pain  Sidelying R hip abd with knee bent: 2 x 15  Seated LAQ: 3# AW, 1 x 15 each leg  Seated Alternating Marches with 3# ankle weights: 2 x 15 each leg  Seated hip adduction (ball squeezes): 2 x 20  Walking laps in clinic for walking endurance: 7 laps (~350 feet) with rollator and 3# AW   Supine Sciatic Nerve Glides: x5 (pain tolerable)  Not today: Lateral stepping in // bars with 3# AW x 6 lengths;   Fwd/bwd walking in // bars with 3# AW x 6 lengths;  Seated clams with blue tband 2 x 15; Seated marches with blue tband 2 x 15; Walking partial lunges in //-bars 4 lengths.  UE assist required.      PATIENT EDUCATION:  Education details: Use of rollator/ upright posture Person educated: Patient Education method: Medical illustrator Education comprehension: verbalized understanding, returned demonstration, and needs further education  HOME EXERCISE PROGRAM: Access Code: [email or web address] URL: [email or web address] Date: 05/28/2022 Prepared by: Dorene Grebe  Exercises - Seated Long Arc Quad  - 2 x daily - 7 x weekly - 1 sets - 20 reps - Seated March  - 2 x daily - 7 x weekly - 1 sets - 20 reps - Seated Heel Toe Raises  - 2 x daily - 7 x weekly - 1 sets - 20 reps - Side Stepping with Counter Support  - 2 x daily - 7 x weekly - 1 sets - 20 reps - Sit to Stand with Counter Support  - 2 x daily - 7 x weekly - 1 sets - 10 reps  ASSESSMENT:  CLINICAL IMPRESSION:   Pt arrives to PT today with new rollator, reports she has been having some R posterior thigh pain since this morning. Session started with gentle stretching to R hamstring in order to try and calm down pain as well as to help improve lumbar/LE mobility. Pt performed some supine/seated therex with mild discomfort to R posterior thigh, particularly with SLR. The rest of the session consisted of working on improving pt's walking tolerance/endurance; she reported slight increase in posterior thigh pain following ambulatoin. Pt returned to plinth at end of session and educated on performing supine sciatic nerve glides to try and improve pain/symptoms in R posterior thigh.  Patient will continue to benefit from skilled PT interventions to address remaining deficits to improve mobility with decreasing symptoms.  OBJECTIVE IMPAIRMENTS: Abnormal gait, decreased activity tolerance, decreased balance, decreased coordination,  decreased endurance, decreased knowledge of use of DME, decreased mobility, difficulty walking, decreased ROM, decreased strength, decreased safety awareness, hypomobility, impaired flexibility, improper body mechanics, postural dysfunction, and pain.   ACTIVITY LIMITATIONS: carrying, lifting, bending, sitting, standing, squatting, sleeping, stairs, transfers, bed mobility, bathing, toileting, dressing, and locomotion level  PARTICIPATION LIMITATIONS: meal prep, cleaning, laundry, driving, shopping, and community activity  PERSONAL FACTORS: Fitness and Past/current experiences are also affecting patient's functional outcome.   REHAB POTENTIAL: Good  CLINICAL DECISION MAKING: Unstable/unpredictable  EVALUATION COMPLEXITY: High   GOALS: Goals reviewed with patient? Yes  SHORT TERM GOALS: Target date: 07/31/22  Pt. Will be able to tolerate standing 10 minutes with no increase c/o low back pain to improve household tasks/ ADL.   Baseline:  Limited standing tolerance (>8/10 pain in back/ R upper thigh); 7/10: unable to tolerate standing x 10 minutes without  increase in back pain (10/10) Goal status: Partially met  LONG TERM GOALS: Target date: 08/21/22  Pt. Will increase FOTO to 54 to improve pain-free mobility.   Baseline: initial 45; 7/10: 23/100 Goal status: IN PROGRESS  2.  Pt. Will complete Berg balance test and score >48/56 to decrease fall risk/ improve safety with daily activity/ walking.  Baseline: 7/10: 31/56.   Goal status: IN PROGRESS  3.  Pt. Will report <5/10 low back/ R upper thigh pain while walking.  Baseline: >8/10 pain; 7/10: 7/10 pain with ambulation Goal status: IN PROGRESS  4.  Pt. Able to ambulate with use of SPC and consistent 2-point gait pattern for 10 minutes with no increase c/o back pain.   Baseline: limited endurance/ flexed posture/ antalgic gait. 7/10: unable to ambulate >10 minutes without pain increasing to 10/10  Goal status: IN  PROGRESS  PLAN:  PT FREQUENCY: 2x/week  PT DURATION: 6 weeks  PLANNED INTERVENTIONS: Therapeutic exercises, Therapeutic activity, Neuromuscular re-education, Balance training, Gait training, Patient/Family education, Self Care, Joint mobilization, Stair training, Aquatic Therapy, Cryotherapy, Moist heat, Manual therapy, and Re-evaluation.  PLAN FOR NEXT SESSION:  LE strengthening/ increase walking endurance, follow up on nerve glides, determine D/C vs. Recert next tx.  [name], PT, DPT # [id number] Cena Benton, SPT Physical Therapist - [facility]   08/15/2022, 1:03 PM

## 2022-08-20 ENCOUNTER — Encounter: Payer: Self-pay | Admitting: Physical Therapy

## 2022-08-20 ENCOUNTER — Ambulatory Visit: Payer: Medicaid Other | Admitting: Physical Therapy

## 2022-08-20 DIAGNOSIS — R293 Abnormal posture: Secondary | ICD-10-CM

## 2022-08-20 DIAGNOSIS — R269 Unspecified abnormalities of gait and mobility: Secondary | ICD-10-CM | POA: Diagnosis not present

## 2022-08-20 DIAGNOSIS — M6281 Muscle weakness (generalized): Secondary | ICD-10-CM

## 2022-08-20 DIAGNOSIS — M5459 Other low back pain: Secondary | ICD-10-CM

## 2022-08-20 NOTE — Therapy (Signed)
OUTPATIENT PHYSICAL THERAPY THORACOLUMBAR TREATMENT  Patient Name: Lindsey Chavez MRN: [id number] DOB:1970-03-20, 52 y.o., female Today's Date: 08/20/2022  END OF SESSION:  PT End of Session - 08/20/22 0946     Visit Number 18    Number of Visits 25    Date for PT Re-Evaluation 08/21/22    PT Start Time 0944    PT Stop Time 1041    PT Time Calculation (min) 57 min    Activity Tolerance Patient limited by pain    Behavior During Therapy [facility] for tasks assessed/performed             Past Medical History:  Diagnosis Date   Asthma    Depressive disorder    Past Surgical History:  Procedure Laterality Date   MOUTH SURGERY     TUBAL LIGATION     Patient Active Problem List   Diagnosis Date Noted   Lymphedema 12/19/2017   Pain in both lower extremities 08/19/2017   Bilateral lower extremity edema 08/19/2017    PCP: Emogene Morgan, MD  REFERRING PROVIDER: Morene Crocker, MD  REFERRING DIAG: Other abnormalities of gait and mobility    Chronic low back pain  Rationale for Evaluation and Treatment: Rehabilitation  THERAPY DIAG:  Gait difficulty  Other low back pain  Abnormal posture  Muscle weakness (generalized)  ONSET DATE: Chronic  SUBJECTIVE:                                                                                                                                                                                           SUBJECTIVE STATEMENT: Pt. Reports chronic h/o low back pain due to scoliosis and bulging discs.  Pt. Has not worked since 2022 due to back pain and receives disability.  Pt. Reports no falls and no previous back surgeries.  Pt. Unable to drive due to back pain.  Pt. Takes Lyrica for pain and sleeps in recliner due to increase pain in bed.  Pt. Lives with 3 sons and requires SPC/ use of grab bar for safety.  Injection with Dr. Mariah Milling 6/24  PERTINENT HISTORY:  REFERRING PHYSICIAN: Aycock, Ngwe Achirimofo* PRIMARY CARE PHYSICIAN:  Aycock, Ngwe Achirimofor, MD  IMPRESSION/PLAN  Lindsey Chavez is a 52 y.o. female presenting for evaluation of  NUMBNESS/ TINGLING/ BACK PAIN/ DIFFICULTY SLEEPING/ DEPRESSION  - Unchanged. - Patient with PMH of HTN, lymphedema, lumbar radiculopathy, with ongoing neuropathy pain. Taking Lyrica 25 mg twice daily and 100 mg at night -Labs from last visit showed A1c is in prediabetic range. Alk phos also slightly elevated. Follow up with PCP. -Reviewed note from vascular recommending compression hose, elevating lower  extremities, consider lymph follow-up, recommended ultrasound lower extremities. - Provided prescription for 3 point cane -Referral to gait and balance physical therapy. -Change Lyrica to 25 mg in the morning, 25 mg in the afternoon and 100 mg at night.  -Continue to stay physically active. -Can start vitamin D 1000 units once daily. -Referral for sleep study to evaluate daytime fatigue. -Continue following with vascular. Continue wearing compression hose and elevating legs. -Alpha-lipoic acid is a naturally occurring, biological antioxidant, which might help with many chronic diseases, mostly diabetic peripheral neuropathy but can help other types of neuropathies as well. Usually given 600 mg/day, may take 3-5 wks before some symptomatic relief. Diabetic patients may need to reduce their diabetic meds. It may exacerbate thiamine deficiency in pt's with chronic malnutrition e.g. alcoholism  Follow up in 2-69mo  Medications previously tried:  CHIEF COMPLAINT & HPI  Lindsey Chavez is a 52 y.o. female presenting for evaluation of: Chief Complaint  Patient presents with  Numbness  Tingling  Back Pain  Sleeping Problem  Depression   NUMBNESS/ TINGLING/ BACK PAIN/ DIFFICULTY SLEEPING/ DEPRESSION  Patient reports with ongoing numbness and tingling in bilateral legs. Notes upcoming [place] LE in April per vascular recommendations. Following visit with vascular specialist, she was recommended  to wear compression hose throughout the day and remove them before bed and to continue to elevate bilateral legs. Notes ongoing swelling in bilateral legs. States she continues to have more frequent numbness, tingling and pain in right leg.Taking Lyrica to 25 mg in the morning, 25 mg in the afternoon and 100 mg at night. He is tolerating this well. Feels this is helpful with symptoms. States continuing to see physiatry for injections in spine. Notes reducing salt intake and getting daily exercise. Notes she spoke with nutritionist and is working on weight loss. Notes ongoing difficulty falling asleep and staying asleep. Reports getting 5 hours of sleep at night. States occasional drowsiness during the day. Notes ongoing appointment with therapist. Reports current mood is stable but ongoing depression. Endorses fatigue and low energy. No recent falls but feels that balance is not doing well. Needs new prescription for cane.  DATA SUMMARY: 12/05/2021 EMG LOWERS  Impression: Abnormal study. 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   05/17/2021 MRI LUMBAR SPINE WO CONTRAST  IMPRESSION:  Lower lumbar degenerative changes as detailed above. Facet  arthropathy has progressed. No new or progressive stenosis.   VISIT SUMMARIES:  MEDICATIONS Outpatient Medications Marked as Taking for the 02/20/22 encounter (Office Visit) with Gelene Mink, PA  Medication Sig  albuterol 90 mcg/actuation inhaler Inhale 2 inhalations into the lungs every 4 (four) hours as needed for Wheezing or Shortness of Breath  ARIPiprazole (ABILIFY) 10 MG tablet Take 10 mg by mouth once daily  busPIRone (BUSPAR) 10 MG tablet Take 10 mg by mouth 3 (three) times daily  diazePAM (VALIUM) 5 MG tablet 1-2 30 minutes before ESI  methocarbamoL (ROBAXIN) 500 MG tablet Take 500 mg by mouth 4 (four) times daily  pregabalin (LYRICA) 25 MG capsule Take 25 mg in the  morning 25 mg in the afternoon and 100 mg at night  risperiDONE (RISPERDAL) 1 MG tablet Take 2 mg by mouth nightly   ALLERGIES Allergies  Allergen Reactions  Pork/Porcine Containing Products Hives   EXAM   Vitals:  02/20/22 0902  BP: 108/77  Pulse: 72  Weight: (!) 118.8 kg (262 lb)  PainSc: 7  PainLoc: Leg   Body  mass index is 39.84 kg/m.  GENERAL: Pleasant female, no distress. Normocephalic and atraumatic.  Baseline neurological exam below was obtained at prior office visit. Changes from today's visit appear in bold.   EYES: PERRLA EOM's intact  MUSCULOSKELETAL: Bulk - Normal Tone - Normal Pronator Drift - Absent bilaterally. Ambulation - Gait and station is slow, antalgic, limping.  Romberg - absent  R/L 5/5 Shoulder abduction (deltoid/supraspinatus, axillary/suprascapular n, C5) 5/5 Elbow flexion (biceps brachii, musculoskeletal n, C5-6) 5/5 Elbow extension (triceps, radial n, C7) 5/5 Finger adduction (interossei, ulnar n, T1)  4/5 Hip flexion (iliopsoas, L1/L2) 4/5 Knee flexion (hamstrings, sciatic n, L5/S1)  4/5 Knee extension (quadriceps, femoral n, L3/4) 5/5 Ankle dorsiflexion (tibialis anterior, deep fibular n, L4/5) 5/5 Ankle plantarflexion (gastroc, tibial n, S1)  Exam limited by pain.   NEUROLOGICAL: MENTAL STATUS: Patient is oriented to person, place and time.  Short-term memory is intact Long-term memory is intact.  Attention span and concentration are intact.  Naming and repetition are intact. Comprehension is intact.  Expressive speech is intact.  Patient's fund of knowledge is within normal limits for educational level.  CRANIAL NERVES: Visual acuity and visual fields are intact  Extraocular muscles are intact  Facial sensation is intact bilaterally  Facial strength is intact bilaterally  Hearing is intact bilaterally  Palate elevates midline, normal phonation  Shoulder shrug strength is intact  Tongue protrudes midline    SENSATION: Pain and temperature (spinothalamic tracts) is normal. Position and vibration (dorsal columns) is normal.  COORDINATION/CEREBELLAR: Finger to nose testing is intact.  REFLEXES:  Negative Hoffman's sign bilaterally.   PAST MEDICAL HISTORY Past Medical History:  Diagnosis Date  Asthma, unspecified asthma severity, unspecified whether complicated, unspecified whether persistent  Chest pain  Depression  ILD (interstitial lung disease) (CMS-HCC)  Major depressive disorder  Sickle cell trait (CMS-HCC)   PAST SURGICAL HISTORY Past Surgical History:  Procedure Laterality Date  dental surgery  LAPAROSCOPIC TUBAL LIGATION   PAIN:  Are you having pain? Yes: NPRS scale: 8/10 Pain location: R upper thigh Pain description: persistent Aggravating factors: standing/ walking Relieving factors: recliner/ medications  PRECAUTIONS: None  WEIGHT BEARING RESTRICTIONS: No  FALLS: Has patient fallen in last 6 months? No  LIVING ENVIRONMENT: Lives with: lives with their family and lives with an adult companion Lives in: House/apartment Stairs: No Has following equipment at home: Single point cane, Tour manager, and Grab bars  OCCUPATION: Disability  PLOF: Independent  PATIENT GOALS: Improve walking/ safety.  Decrease low back pain.    NEXT MD VISIT: 06/08/22  OBJECTIVE:   DIAGNOSTIC FINDINGS:  CLINICAL DATA:  Chronic low back pain, unspecified laterality; right lumbar radiculopathy; spinal stenosis of lumbosacral region   EXAM: MRI LUMBAR SPINE WITHOUT CONTRAST   TECHNIQUE: Multiplanar, multisequence MR imaging of the lumbar spine was performed. No intravenous contrast was administered.   COMPARISON:  03/04/2018   FINDINGS: Segmentation:  Standard.   Alignment: Stable. Mild levocurvature. Trace anterolisthesis at L4-L5.   Vertebrae: Vertebral body heights are maintained. Mild degenerative marrow edema at L5-S1 eccentric to the left.   Conus  medullaris and cauda equina: Conus extends to the L2-L3 level. Conus and cauda equina appear normal.   Paraspinal and other soft tissues: Unremarkable.   Disc levels: L1-L2:  No canal or foraminal stenosis. L2-L3:  No canal or foraminal stenosis. L3-L4:  Minor facet arthropathy.  No canal or foraminal stenosis. L4-L5: Disc desiccation. Anterolisthesis with uncovering of disc. Right foraminal protrusion. Moderate facet arthropathy withligamentum flavum infolding.  No canal or left foraminal stenosis.Minor right foraminal stenosis. Disc is in proximity to but does not definitely compress the exiting L4 nerve root.L5-S1: Disc desiccation. Mild right and moderate left facetarthropathy. No canal or foraminal stenosis. IMPRESSION:Lower lumbar degenerative changes as detailed above. Facet arthropathy has progressed. No new or progressive stenosis.  PATIENT SURVEYS:  FOTO initial 45/ goal 66  SCREENING FOR RED FLAGS: Bowel or bladder incontinence: No Spinal tumors: No Cauda equina syndrome: No Compression fracture: No Abdominal aneurysm: No  COGNITION: Overall cognitive status: Within functional limits for tasks assessed     SENSATION: WFL  MUSCLE LENGTH: Hamstrings: Right 60 deg; Left 60 deg  (severe pain reported) Maisie Fus test: NT  (pain getting into position)  POSTURE: rounded shoulders, forward head, and flexed trunk   PALPATION: Generalized lumbar hypomobility/ tenderness with palpation.  LUMBAR ROM:   Stands in flexed posture (25 deg.)- pain limited with correction.  Limited/ poor standing tolerance and required 2 seated rest breaks during lumbar testing.   AROM eval  Flexion 25% limited   Extension 100% limited (pain)  Right lateral flexion 50% limited (pain)  Left lateral flexion 50% limited (pain)  Right rotation 50% limited (pain)  Left rotation 50% limited (pain)   (Blank rows = not tested)  LOWER EXTREMITY ROM:      B LE AROM WFL except hip extension (pain)  LOWER  EXTREMITY MMT:    MMT Right eval Left eval  Hip flexion 3+ (pain) 3+  Hip extension Unable to test Unable to test  Hip abduction 3 3+  Hip adduction 4 4  Hip internal rotation 4 4  Hip external rotation 4 4  Knee flexion 4 (LBP) 4  Knee extension 4 (LBP) 4  Ankle dorsiflexion    Ankle plantarflexion    Ankle inversion    Ankle eversion     (Blank rows = not tested)  LUMBAR SPECIAL TESTS:  Straight leg raise test: Positive and FABER test: Positive  FUNCTIONAL TESTS:  5 times sit to stand: TBD Berg Balance Scale: TBD  GAIT: Distance walked: in clinic Assistive device utilized: Single point cane  PT discussed use of rollator/ demonstrates Level of assistance: Modified independence Comments: Marked antalgic gait with limited hip/knee flexion and heavy B UE assist with SPC or rollator.  Poor endurance.  R LE muscle weakness/ fatigue reported.    TODAY'S TREATMENT:                                                                                                                              DATE: 08/20/2022  Subjective: Pt reports 7/10 low back pain this morning. Pt. States she was active over the weekend and completed HEP.     Ther-ex   Nustep L5 x 12 min. B UE/LE for LE strengthening and gentle lumbar mobility.  Seated LAQ: 4# AW, 20x each leg (cuing for slow, controlled quad movement).    Seated marching 4# AW 20x  Seated hip adduction (ball squeezes): 2 x 20  6" step touches/ ups with light assist on //-bars 15x.    Lateral walking at agility ladder with no UE assist.    Seated Sciatic Nerve Glides: x5 (pain tolerable)  Walking in //-bars with high marching/ alt. LE touches 5 laps (seated rest break)- fatigue.   Walking laps in gym for walking endurance: 8 laps with rollator.  No increase c/o pain.  Walking in hallway with use of rollator and feedback on posture.    Discussed HEP  Not today: Lateral stepping in // bars with 3# AW x 6 lengths;  Fwd/bwd walking  in // bars with 3# AW x 6 lengths;  Seated clams with blue tband 2 x 15; Seated marches with blue tband 2 x 15; Walking partial lunges in //-bars 4 lengths.  UE assist required.      PATIENT EDUCATION:  Education details: Use of rollator/ upright posture Person educated: Patient Education method: Medical illustrator Education comprehension: verbalized understanding, returned demonstration, and needs further education  HOME EXERCISE PROGRAM: Access Code: [email or web address] URL: [email or web address] Date: 05/28/2022 Prepared by: Dorene Grebe  Exercises - Seated Long Arc Quad  - 2 x daily - 7 x weekly - 1 sets - 20 reps - Seated March  - 2 x daily - 7 x weekly - 1 sets - 20 reps - Seated Heel Toe Raises  - 2 x daily - 7 x weekly - 1 sets - 20 reps - Side Stepping with Counter Support  - 2 x daily - 7 x weekly - 1 sets - 20 reps - Sit to Stand with Counter Support  - 2 x daily - 7 x weekly - 1 sets - 10 reps  ASSESSMENT:  CLINICAL IMPRESSION:   Pt reports continued low back symptoms and states her R posterior thigh is not hurting as compared to last tx. Session.  Tx. Focus on LE strengthening and feedback on proper posture/ gait with and without use of rollator.  Pt. Ambulates with marked improvement in cadence/ consistent step pattern while using rollator.  Patient will continue to benefit from skilled PT interventions to address remaining deficits to improve mobility with decreasing symptoms.  OBJECTIVE IMPAIRMENTS: Abnormal gait, decreased activity tolerance, decreased balance, decreased coordination, decreased endurance, decreased knowledge of use of DME, decreased mobility, difficulty walking, decreased ROM, decreased strength, decreased safety awareness, hypomobility, impaired flexibility, improper body mechanics, postural dysfunction, and pain.   ACTIVITY LIMITATIONS: carrying, lifting, bending, sitting, standing, squatting, sleeping, stairs, transfers, bed  mobility, bathing, toileting, dressing, and locomotion level  PARTICIPATION LIMITATIONS: meal prep, cleaning, laundry, driving, shopping, and community activity  PERSONAL FACTORS: Fitness and Past/current experiences are also affecting patient's functional outcome.   REHAB POTENTIAL: Good  CLINICAL DECISION MAKING: Unstable/unpredictable  EVALUATION COMPLEXITY: High   GOALS: Goals reviewed with patient? Yes  SHORT TERM GOALS: Target date: 07/31/22  Pt. Will be able to tolerate standing 10 minutes with no increase c/o low back pain to improve household tasks/ ADL.   Baseline:  Limited standing tolerance (>8/10 pain in back/ R upper thigh); 7/10: unable to tolerate standing x 10 minutes without increase in back pain (10/10) Goal status: Partially met  LONG TERM GOALS: Target date: 08/21/22  Pt. Will increase FOTO to 54 to improve pain-free mobility.   Baseline: initial 45; 7/10: 23/100 Goal status: IN PROGRESS  2.  Pt. Will complete Berg balance test and score >48/56 to decrease fall risk/ improve safety with daily activity/ walking.  Baseline: 7/10: 31/56.   Goal status: IN PROGRESS  3.  Pt. Will report <5/10 low back/ R upper thigh pain while walking.  Baseline: >8/10 pain; 7/10: 7/10 pain with ambulation Goal status: IN PROGRESS  4.  Pt. Able to ambulate with use of SPC and consistent 2-point gait pattern for 10 minutes with no increase c/o back pain.   Baseline: limited endurance/ flexed posture/ antalgic gait. 7/10: unable to ambulate >10 minutes without pain increasing to 10/10  Goal status: IN PROGRESS  PLAN:  PT FREQUENCY: 2x/week  PT DURATION: 6 weeks  PLANNED INTERVENTIONS: Therapeutic exercises, Therapeutic activity, Neuromuscular re-education, Balance training, Gait training, Patient/Family education, Self Care, Joint mobilization, Stair training, Aquatic Therapy, Cryotherapy, Moist heat, Manual therapy, and Re-evaluation.  PLAN FOR NEXT SESSION:  LE  strengthening/ increase walking endurance, follow up on nerve glides, determine D/C vs. Recert next tx.  CHECK GOALS.    [name], PT, DPT # [phone] Physical Therapist - [facility]   08/20/2022, 1:09 PM

## 2022-08-22 ENCOUNTER — Encounter: Payer: Self-pay | Admitting: Physical Therapy

## 2022-08-22 ENCOUNTER — Ambulatory Visit: Payer: Medicaid Other | Admitting: Physical Therapy

## 2022-08-22 DIAGNOSIS — M5459 Other low back pain: Secondary | ICD-10-CM

## 2022-08-22 DIAGNOSIS — R293 Abnormal posture: Secondary | ICD-10-CM

## 2022-08-22 DIAGNOSIS — M6281 Muscle weakness (generalized): Secondary | ICD-10-CM

## 2022-08-22 DIAGNOSIS — R269 Unspecified abnormalities of gait and mobility: Secondary | ICD-10-CM

## 2022-08-22 NOTE — Therapy (Signed)
OUTPATIENT PHYSICAL THERAPY THORACOLUMBAR RECERTIFICATION/ DISCHARGE  Patient Name: Lindsey Chavez MRN: [id number] DOB:April 19, 1970, 52 y.o., female Today's Date: 08/22/2022  END OF SESSION:  PT End of Session - 08/22/22 1452     Visit Number 19    Number of Visits 19    Date for PT Re-Evaluation 08/22/22    PT Start Time 1002    PT Stop Time 1048    PT Time Calculation (min) 46 min    Equipment Utilized During Treatment Gait belt    Activity Tolerance Patient tolerated treatment well    Behavior During Therapy WFL for tasks assessed/performed              Past Medical History:  Diagnosis Date   Asthma    Depressive disorder    Past Surgical History:  Procedure Laterality Date   MOUTH SURGERY     TUBAL LIGATION     Patient Active Problem List   Diagnosis Date Noted   Lymphedema 12/19/2017   Pain in both lower extremities 08/19/2017   Bilateral lower extremity edema 08/19/2017    PCP: Emogene Morgan, MD  REFERRING PROVIDER: Morene Crocker, MD  REFERRING DIAG: Other abnormalities of gait and mobility    Chronic low back pain  Rationale for Evaluation and Treatment: Rehabilitation  THERAPY DIAG:  Gait difficulty  Other low back pain  Abnormal posture  Muscle weakness (generalized)  ONSET DATE: Chronic  SUBJECTIVE:                                                                                                                                                                                           SUBJECTIVE STATEMENT: Pt. Reports chronic h/o low back pain due to scoliosis and bulging discs.  Pt. Has not worked since 2022 due to back pain and receives disability.  Pt. Reports no falls and no previous back surgeries.  Pt. Unable to drive due to back pain.  Pt. Takes Lyrica for pain and sleeps in recliner due to increase pain in bed.  Pt. Lives with 3 sons and requires SPC/ use of grab bar for safety.  Injection with Dr. Mariah Milling 6/24  PERTINENT  HISTORY:  REFERRING PHYSICIAN: Aycock, Ngwe Achirimofo* PRIMARY CARE PHYSICIAN: Aycock, Ngwe Achirimofor, MD  IMPRESSION/PLAN  Lindsey Chavez is a 52 y.o. female presenting for evaluation of  NUMBNESS/ TINGLING/ BACK PAIN/ DIFFICULTY SLEEPING/ DEPRESSION  - Unchanged. - Patient with PMH of HTN, lymphedema, lumbar radiculopathy, with ongoing neuropathy pain. Taking Lyrica 25 mg twice daily and 100 mg at night -Labs from last visit showed A1c is in prediabetic range. Alk phos also slightly elevated. Follow up  with PCP. -Reviewed note from vascular recommending compression hose, elevating lower extremities, consider lymph follow-up, recommended ultrasound lower extremities. - Provided prescription for 3 point cane -Referral to gait and balance physical therapy. -Change Lyrica to 25 mg in the morning, 25 mg in the afternoon and 100 mg at night.  -Continue to stay physically active. -Can start vitamin D 1000 units once daily. -Referral for sleep study to evaluate daytime fatigue. -Continue following with vascular. Continue wearing compression hose and elevating legs. -Alpha-lipoic acid is a naturally occurring, biological antioxidant, which might help with many chronic diseases, mostly diabetic peripheral neuropathy but can help other types of neuropathies as well. Usually given 600 mg/day, may take 3-5 wks before some symptomatic relief. Diabetic patients may need to reduce their diabetic meds. It may exacerbate thiamine deficiency in pt's with chronic malnutrition e.g. alcoholism  Follow up in 2-67mo  Medications previously tried:  CHIEF COMPLAINT & HPI  Lindsey Chavez is a 52 y.o. female presenting for evaluation of: Chief Complaint  Patient presents with  Numbness  Tingling  Back Pain  Sleeping Problem  Depression   NUMBNESS/ TINGLING/ BACK PAIN/ DIFFICULTY SLEEPING/ DEPRESSION  Patient reports with ongoing numbness and tingling in bilateral legs. Notes upcoming [place] LE in April per  vascular recommendations. Following visit with vascular specialist, she was recommended to wear compression hose throughout the day and remove them before bed and to continue to elevate bilateral legs. Notes ongoing swelling in bilateral legs. States she continues to have more frequent numbness, tingling and pain in right leg.Taking Lyrica to 25 mg in the morning, 25 mg in the afternoon and 100 mg at night. He is tolerating this well. Feels this is helpful with symptoms. States continuing to see physiatry for injections in spine. Notes reducing salt intake and getting daily exercise. Notes she spoke with nutritionist and is working on weight loss. Notes ongoing difficulty falling asleep and staying asleep. Reports getting 5 hours of sleep at night. States occasional drowsiness during the day. Notes ongoing appointment with therapist. Reports current mood is stable but ongoing depression. Endorses fatigue and low energy. No recent falls but feels that balance is not doing well. Needs new prescription for cane.  DATA SUMMARY: 12/05/2021 EMG LOWERS  Impression: Abnormal study. 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   05/17/2021 MRI LUMBAR SPINE WO CONTRAST  IMPRESSION:  Lower lumbar degenerative changes as detailed above. Facet  arthropathy has progressed. No new or progressive stenosis.   VISIT SUMMARIES:  MEDICATIONS Outpatient Medications Marked as Taking for the 02/20/22 encounter (Office Visit) with Gelene Mink, PA  Medication Sig  albuterol 90 mcg/actuation inhaler Inhale 2 inhalations into the lungs every 4 (four) hours as needed for Wheezing or Shortness of Breath  ARIPiprazole (ABILIFY) 10 MG tablet Take 10 mg by mouth once daily  busPIRone (BUSPAR) 10 MG tablet Take 10 mg by mouth 3 (three) times daily  diazePAM (VALIUM) 5 MG tablet 1-2 30 minutes before ESI  methocarbamoL (ROBAXIN) 500 MG tablet Take 500  mg by mouth 4 (four) times daily  pregabalin (LYRICA) 25 MG capsule Take 25 mg in the morning 25 mg in the afternoon and 100 mg at night  risperiDONE (RISPERDAL) 1 MG tablet Take 2 mg by mouth nightly   ALLERGIES Allergies  Allergen Reactions  Pork/Porcine Containing Products Hives   EXAM   Vitals:  02/20/22 0902  BP: 108/77  Pulse: 72  Weight: (!) 118.8 kg (  262 lb)  PainSc: 7  PainLoc: Leg   Body mass index is 39.84 kg/m.  GENERAL: Pleasant female, no distress. Normocephalic and atraumatic.  Baseline neurological exam below was obtained at prior office visit. Changes from today's visit appear in bold.   EYES: PERRLA EOM's intact  MUSCULOSKELETAL: Bulk - Normal Tone - Normal Pronator Drift - Absent bilaterally. Ambulation - Gait and station is slow, antalgic, limping.  Romberg - absent  R/L 5/5 Shoulder abduction (deltoid/supraspinatus, axillary/suprascapular n, C5) 5/5 Elbow flexion (biceps brachii, musculoskeletal n, C5-6) 5/5 Elbow extension (triceps, radial n, C7) 5/5 Finger adduction (interossei, ulnar n, T1)  4/5 Hip flexion (iliopsoas, L1/L2) 4/5 Knee flexion (hamstrings, sciatic n, L5/S1)  4/5 Knee extension (quadriceps, femoral n, L3/4) 5/5 Ankle dorsiflexion (tibialis anterior, deep fibular n, L4/5) 5/5 Ankle plantarflexion (gastroc, tibial n, S1)  Exam limited by pain.   NEUROLOGICAL: MENTAL STATUS: Patient is oriented to person, place and time.  Short-term memory is intact Long-term memory is intact.  Attention span and concentration are intact.  Naming and repetition are intact. Comprehension is intact.  Expressive speech is intact.  Patient's fund of knowledge is within normal limits for educational level.  CRANIAL NERVES: Visual acuity and visual fields are intact  Extraocular muscles are intact  Facial sensation is intact bilaterally  Facial strength is intact bilaterally  Hearing is intact bilaterally  Palate elevates midline,  normal phonation  Shoulder shrug strength is intact  Tongue protrudes midline   SENSATION: Pain and temperature (spinothalamic tracts) is normal. Position and vibration (dorsal columns) is normal.  COORDINATION/CEREBELLAR: Finger to nose testing is intact.  REFLEXES:  Negative Hoffman's sign bilaterally.   PAST MEDICAL HISTORY Past Medical History:  Diagnosis Date  Asthma, unspecified asthma severity, unspecified whether complicated, unspecified whether persistent  Chest pain  Depression  ILD (interstitial lung disease) (CMS-HCC)  Major depressive disorder  Sickle cell trait (CMS-HCC)   PAST SURGICAL HISTORY Past Surgical History:  Procedure Laterality Date  dental surgery  LAPAROSCOPIC TUBAL LIGATION   PAIN:  Are you having pain? Yes: NPRS scale: 8/10 Pain location: R upper thigh Pain description: persistent Aggravating factors: standing/ walking Relieving factors: recliner/ medications  PRECAUTIONS: None  WEIGHT BEARING RESTRICTIONS: No  FALLS: Has patient fallen in last 6 months? No  LIVING ENVIRONMENT: Lives with: lives with their family and lives with an adult companion Lives in: House/apartment Stairs: No Has following equipment at home: Single point cane, Tour manager, and Grab bars  OCCUPATION: Disability  PLOF: Independent  PATIENT GOALS: Improve walking/ safety.  Decrease low back pain.    NEXT MD VISIT: 06/08/22  OBJECTIVE:   DIAGNOSTIC FINDINGS:  CLINICAL DATA:  Chronic low back pain, unspecified laterality; right lumbar radiculopathy; spinal stenosis of lumbosacral region   EXAM: MRI LUMBAR SPINE WITHOUT CONTRAST   TECHNIQUE: Multiplanar, multisequence MR imaging of the lumbar spine was performed. No intravenous contrast was administered.   COMPARISON:  03/04/2018   FINDINGS: Segmentation:  Standard.   Alignment: Stable. Mild levocurvature. Trace anterolisthesis at L4-L5.   Vertebrae: Vertebral body heights are maintained.  Mild degenerative marrow edema at L5-S1 eccentric to the left.   Conus medullaris and cauda equina: Conus extends to the L2-L3 level. Conus and cauda equina appear normal.   Paraspinal and other soft tissues: Unremarkable.   Disc levels: L1-L2:  No canal or foraminal stenosis. L2-L3:  No canal or foraminal stenosis. L3-L4:  Minor facet arthropathy.  No canal or foraminal stenosis. L4-L5: Disc desiccation. Anterolisthesis with uncovering  of disc. Right foraminal protrusion. Moderate facet arthropathy withligamentum flavum infolding. No canal or left foraminal stenosis.Minor right foraminal stenosis. Disc is in proximity to but does not definitely compress the exiting L4 nerve root.L5-S1: Disc desiccation. Mild right and moderate left facetarthropathy. No canal or foraminal stenosis. IMPRESSION:Lower lumbar degenerative changes as detailed above. Facet arthropathy has progressed. No new or progressive stenosis.  PATIENT SURVEYS:  FOTO initial 45/ goal 31  SCREENING FOR RED FLAGS: Bowel or bladder incontinence: No Spinal tumors: No Cauda equina syndrome: No Compression fracture: No Abdominal aneurysm: No  COGNITION: Overall cognitive status: Within functional limits for tasks assessed     SENSATION: WFL  MUSCLE LENGTH: Hamstrings: Right 60 deg; Left 60 deg  (severe pain reported) Maisie Fus test: NT  (pain getting into position)  POSTURE: rounded shoulders, forward head, and flexed trunk   PALPATION: Generalized lumbar hypomobility/ tenderness with palpation.  LUMBAR ROM:   Stands in flexed posture (25 deg.)- pain limited with correction.  Limited/ poor standing tolerance and required 2 seated rest breaks during lumbar testing.   AROM eval  Flexion 25% limited   Extension 100% limited (pain)  Right lateral flexion 50% limited (pain)  Left lateral flexion 50% limited (pain)  Right rotation 50% limited (pain)  Left rotation 50% limited (pain)   (Blank rows = not tested)  LOWER  EXTREMITY ROM:      B LE AROM WFL except hip extension (pain)  LOWER EXTREMITY MMT:    MMT Right eval Left eval  Hip flexion 3+ (pain) 3+  Hip extension Unable to test Unable to test  Hip abduction 3 3+  Hip adduction 4 4  Hip internal rotation 4 4  Hip external rotation 4 4  Knee flexion 4 (LBP) 4  Knee extension 4 (LBP) 4  Ankle dorsiflexion    Ankle plantarflexion    Ankle inversion    Ankle eversion     (Blank rows = not tested)  LUMBAR SPECIAL TESTS:  Straight leg raise test: Positive and FABER test: Positive  FUNCTIONAL TESTS:  5 times sit to stand: TBD Berg Balance Scale: TBD  GAIT: Distance walked: in clinic Assistive device utilized: Single point cane  PT discussed use of rollator/ demonstrates Level of assistance: Modified independence Comments: Marked antalgic gait with limited hip/knee flexion and heavy B UE assist with SPC or rollator.  Poor endurance.  R LE muscle weakness/ fatigue reported.    TODAY'S TREATMENT:                                                                                                                              DATE: 08/22/2022  Subjective: Pt reports 5/10 back pain. Pt reports exercises at home still going well.  Pt. Feels more comfortable with use of rollator for household/ community ambulation.    Ther-ex  FOTO: 54 (goal met)  Berg Balance: 41/56  5XSTS: 28.3 seconds  LE MMT:  Hip flexion: 3 B  Hip abd: 4 B  Hip add: 4 B  Hip IR: 4+ L, 4+ R  Hip ER: 4+ B   Knee Ext: 5 B  Knee Flex: 4 R, 4+ L  Ankle DF: 4 R, 4 L    Nustep L5 x 10 min. B UE/LE for LE strengthening and gentle lumbar mobility.  Walking laps in gym for walking endurance: 10 laps with rollator, 4# AW - mild increase in low back pain towards end of walk    Reviewed HEP  PATIENT EDUCATION:  Education details: Use of rollator/ upright posture Person educated: Patient Education method: Medical illustrator Education comprehension:  verbalized understanding, returned demonstration, and needs further education  HOME EXERCISE PROGRAM: Access Code: [email or web address] URL: [email or web address] Date: 05/28/2022 Prepared by: Dorene Grebe  Exercises - Seated Long Arc Quad  - 2 x daily - 7 x weekly - 1 sets - 20 reps - Seated March  - 2 x daily - 7 x weekly - 1 sets - 20 reps - Seated Heel Toe Raises  - 2 x daily - 7 x weekly - 1 sets - 20 reps - Side Stepping with Counter Support  - 2 x daily - 7 x weekly - 1 sets - 20 reps - Sit to Stand with Counter Support  - 2 x daily - 7 x weekly - 1 sets - 10 reps  ASSESSMENT:  CLINICAL IMPRESSION:   Pt presents to PT with usual moderate back pain (5/10). Session today consisted of re-testing strength and balance/strength outcome measures. Pt demonstrates improvements in knee extension MMT as well as mild improvements in bilateral hip ER and IR MMT. Pt scored 41/56 on Berg Balance scale, improved from last score however still at an increased risk for falls; pt will benefit from continuing to use rollator for at home/community mobility. Pt made good improvements on all goals and was able to meet FOTO and walking tolerance goals with moderate pain. Pt reports feeling comfortable with using HEP to independently manage back symptoms now and will be discharged from PT at this time.   OBJECTIVE IMPAIRMENTS: Abnormal gait, decreased activity tolerance, decreased balance, decreased coordination, decreased endurance, decreased knowledge of use of DME, decreased mobility, difficulty walking, decreased ROM, decreased strength, decreased safety awareness, hypomobility, impaired flexibility, improper body mechanics, postural dysfunction, and pain.   ACTIVITY LIMITATIONS: carrying, lifting, bending, sitting, standing, squatting, sleeping, stairs, transfers, bed mobility, bathing, toileting, dressing, and locomotion level  PARTICIPATION LIMITATIONS: meal prep, cleaning, laundry, driving, shopping,  and community activity  PERSONAL FACTORS: Fitness and Past/current experiences are also affecting patient's functional outcome.   REHAB POTENTIAL: Good  CLINICAL DECISION MAKING: Unstable/unpredictable  EVALUATION COMPLEXITY: High   GOALS: Goals reviewed with patient? Yes  SHORT TERM GOALS: Target date: 07/31/22  Pt. Will be able to tolerate standing 10 minutes with no increase c/o low back pain to improve household tasks/ ADL.   Baseline:  Limited standing tolerance (>8/10 pain in back/ R upper thigh); 7/10: unable to tolerate standing x 10 minutes without increase in back pain (10/10) Goal status: Partially met  LONG TERM GOALS: Target date: 08/21/22  Pt. Will increase FOTO to 54 to improve pain-free mobility.   Baseline: initial 45; 7/10: 23/100. 8/14: 54  Goal status: Goal met   2.  Pt. Will complete Berg balance test and score >48/56 to decrease fall risk/ improve safety with daily activity/ walking.  Baseline: 7/10: 31/56.   Goal status: Not met, but improved (41/56)  3.  Pt. Will  report <5/10 low back/ R upper thigh pain while walking.  Baseline: >8/10 pain; 7/10: 7/10 pain with ambulation Goal status: Goal met   4.  Pt. Able to ambulate with use of SPC and consistent 2-point gait pattern for 10 minutes with no increase c/o back pain.   Baseline: limited endurance/ flexed posture/ antalgic gait. 7/10: unable to ambulate >10 minutes without pain increasing to 10/10.  8/14: pt. Benefit from use of rollator to increase walking endurance/ safety  Goal status: Not met  PLAN:  PT FREQUENCY: 1x visit  PLANNED INTERVENTIONS: Therapeutic exercises, Therapeutic activity, Neuromuscular re-education, Balance training, Gait training, Patient/Family education, Self Care, Joint mobilization, Stair training, Aquatic Therapy, Cryotherapy, Moist heat, Manual therapy, and Re-evaluation.  PLAN FOR NEXT SESSION:  Discharge   [name], PT, DPT # [phone] [place],  SPT 08/22/22, 5:27 PM

## 2023-03-08 ENCOUNTER — Encounter: Payer: Self-pay | Admitting: Internal Medicine

## 2023-03-18 ENCOUNTER — Encounter: Payer: Self-pay | Admitting: Internal Medicine

## 2023-03-19 ENCOUNTER — Other Ambulatory Visit: Payer: Self-pay

## 2023-03-19 ENCOUNTER — Ambulatory Visit
Admission: RE | Admit: 2023-03-19 | Discharge: 2023-03-19 | Disposition: A | Discharge status: Home / Self Care | Payer: Medicaid Other | Attending: Internal Medicine | Admitting: Internal Medicine

## 2023-03-19 ENCOUNTER — Ambulatory Visit: Admitting: Anesthesiology

## 2023-03-19 ENCOUNTER — Encounter
Admission: RE | Disposition: A | Discharge status: Home / Self Care | Payer: Self-pay | Source: Home / Self Care | Attending: Internal Medicine

## 2023-03-19 ENCOUNTER — Encounter: Payer: Self-pay | Admitting: Internal Medicine

## 2023-03-19 DIAGNOSIS — Z1211 Encounter for screening for malignant neoplasm of colon: Secondary | ICD-10-CM | POA: Diagnosis present

## 2023-03-19 DIAGNOSIS — F32A Depression, unspecified: Secondary | ICD-10-CM | POA: Insufficient documentation

## 2023-03-19 DIAGNOSIS — E119 Type 2 diabetes mellitus without complications: Secondary | ICD-10-CM | POA: Diagnosis not present

## 2023-03-19 DIAGNOSIS — I10 Essential (primary) hypertension: Secondary | ICD-10-CM | POA: Insufficient documentation

## 2023-03-19 HISTORY — DX: Localized edema: R60.0

## 2023-03-19 HISTORY — DX: Other intervertebral disc degeneration, lumbar region without mention of lumbar back pain or lower extremity pain: M51.369

## 2023-03-19 HISTORY — DX: Lymphedema, not elsewhere classified: I89.0

## 2023-03-19 HISTORY — DX: Essential (primary) hypertension: I10

## 2023-03-19 HISTORY — PX: COLONOSCOPY WITH PROPOFOL: SHX5780

## 2023-03-19 SURGERY — COLONOSCOPY WITH PROPOFOL
Anesthesia: General

## 2023-03-19 MED ORDER — PROPOFOL 10 MG/ML IV BOLUS
INTRAVENOUS | Status: DC | PRN
  Administered 2023-03-19: 100 mg via INTRAVENOUS

## 2023-03-19 MED ORDER — PROPOFOL 500 MG/50ML IV EMUL
INTRAVENOUS | Status: DC | PRN
  Administered 2023-03-19: 150 ug/kg/min via INTRAVENOUS

## 2023-03-19 MED ORDER — PROPOFOL 1000 MG/100ML IV EMUL
INTRAVENOUS | Status: AC
  Filled 2023-03-19: qty 100

## 2023-03-19 MED ORDER — SODIUM CHLORIDE 0.9 % IV SOLN: INTRAVENOUS | Status: DC

## 2023-03-19 NOTE — Op Note (Signed)
 [facility] Gastroenterology Patient Name: Lindsey Chavez Procedure Date: 03/19/2023 9:41 AM MRN: [id number] Account #: [id number] Date of Birth: [birth year] Admit Type: Outpatient Age: 53 Room: [facility] ENDO ROOM 1 Gender: Female Note Status: Finalized Instrument Name: Prentice Docker [id number] Procedure:             Colonoscopy Indications:           Screening for colorectal malignant neoplasm Providers:             Royce Macadamia K. Norma Fredrickson MD, MD Referring MD:          Sylvie Farrier. Aycock MD (Referring MD) Medicines:             Propofol per Anesthesia Complications:         No immediate complications. Estimated blood loss: None. Procedure:             Pre-Anesthesia Assessment:                        - The risks and benefits of the procedure and the                         sedation options and risks were discussed with the                         patient. All questions were answered and informed                         consent was obtained.                        - Patient identification and proposed procedure were                         verified prior to the procedure by the nurse. The                         procedure was verified in the procedure room.                        - ASA Grade Assessment: III - A patient with severe                         systemic disease.                        - After reviewing the risks and benefits, the patient                         was deemed in satisfactory condition to undergo the                         procedure.                        After obtaining informed consent, the colonoscope was                         passed under direct vision. Throughout the procedure,  the patient's blood pressure, pulse, and oxygen                         saturations were monitored continuously. The                         Colonoscope was introduced through the anus with the                         intention of advancing to the  cecum. The scope was                         advanced to the descending colon before the procedure                         was aborted. Medications were given. The colonoscopy                         was performed with difficulty due to inadequate bowel                         prep. The patient tolerated the procedure well. The                         quality of the bowel preparation was inadequate.                         Anatomical landmarks were photographed. The                         colonoscopy was aborted due to inadequate bowel prep. Findings:      The perianal and digital rectal examinations were normal. Pertinent       negatives include normal sphincter tone and no palpable rectal lesions.      Copious quantities of semi-liquid stool was found in the rectum, in the       recto-sigmoid colon and in the sigmoid colon, precluding visualization.       Due to poor prep , the procedure was aborted at this point and the scope       was withdrawn. Impression:            - Preparation of the colon was inadequate.                        - The procedure was aborted due to inadequate bowel                         prep.                        - Stool in the rectum, in the recto-sigmoid colon and                         in the sigmoid colon.                        - No specimens collected. Recommendation:        - Patient has a contact number available for  emergencies. The signs and symptoms of potential                         delayed complications were discussed with the patient.                         Return to normal activities tomorrow. Written                         discharge instructions were provided to the patient.                        - Resume previous diet.                        - Continue present medications.                        - Plan Miralax 17g daily (one capful) daily for 2                         weeks prior to next colonoscopy, 2 days of  clear                         liquids before next colonoscopy. Plan to use higher                         volume prep (Trilyte) before next scheduled                         colonoscopy.                        - Telephone GI office to schedule appointment in 1                         month.                        - The findings and recommendations were discussed with                         the patient. Procedure Code(s):     --- Professional ---                        Z6109, 53, Colorectal cancer screening; colonoscopy on                         individual not meeting criteria for high risk Diagnosis Code(s):     --- Professional ---                        Z12.11, Encounter for screening for malignant neoplasm                         of colon CPT copyright 2022 American Medical Association. All rights reserved. The codes documented in this report are preliminary and upon coder review may  be revised to meet current compliance requirements. [name] MD, MD 03/19/2023 9:59:43 AM This report has been signed electronically.  Number of Addenda: 0 Note Initiated On: 03/19/2023 9:41 AM Total Procedure Duration: 0 hours 4 minutes 6 seconds  Estimated Blood Loss:  Estimated blood loss: none.      [facility]

## 2023-03-19 NOTE — Anesthesia Postprocedure Evaluation (Signed)
 Anesthesia Post Note  Patient: Shayana Hornstein  Procedure(s) Performed: COLONOSCOPY WITH PROPOFOL  Patient location during evaluation: PACU Anesthesia Type: General Level of consciousness: awake and alert Pain management: pain level controlled Vital Signs Assessment: post-procedure vital signs reviewed and stable Respiratory status: spontaneous breathing, nonlabored ventilation, respiratory function stable and patient connected to nasal cannula oxygen Cardiovascular status: blood pressure returned to baseline and stable Postop Assessment: no apparent nausea or vomiting Anesthetic complications: no   There were no known notable events for this encounter.   Last Vitals:  Vitals:   03/19/23 0959 03/19/23 1009  BP: 96/64   Pulse: 79 78  Resp:    Temp: (!) 36.1 C   SpO2: 100% 100%    Last Pain:  Vitals:   03/19/23 1009  TempSrc:   PainSc: 0-No pain                 Yevette Edwards

## 2023-03-19 NOTE — Transfer of Care (Signed)
 Immediate Anesthesia Transfer of Care Note  Patient: Lindsey Chavez  Procedure(s) Performed: COLONOSCOPY WITH PROPOFOL  Patient Location: PACU  Anesthesia Type:General  Level of Consciousness: awake and sedated  Airway & Oxygen Therapy: Patient Spontanous Breathing and Patient connected to face mask oxygen  Post-op Assessment: Report given to RN and Post -op Vital signs reviewed and stable  Post vital signs: Reviewed and stable  Last Vitals:  Vitals Value Taken Time  BP    Temp    Pulse    Resp    SpO2      Last Pain:  Vitals:   03/19/23 0813  TempSrc: Temporal  PainSc: 0-No pain         Complications: There were no known notable events for this encounter.

## 2023-03-19 NOTE — Anesthesia Preprocedure Evaluation (Signed)
 Anesthesia Evaluation  Patient identified by MRN, date of birth, ID band Patient awake    Reviewed: Allergy & Precautions, H&P , NPO status , Patient's Chart, lab work & pertinent test results, reviewed documented beta blocker date and time   Airway Mallampati: II   Neck ROM: full    Dental  (+) Poor Dentition   Pulmonary asthma    Pulmonary exam normal        Cardiovascular Exercise Tolerance: Poor hypertension, On Medications negative cardio ROS Normal cardiovascular exam Rhythm:regular Rate:Normal     Neuro/Psych  PSYCHIATRIC DISORDERS  Depression    negative neurological ROS     GI/Hepatic negative GI ROS, Neg liver ROS,,,  Endo/Other  negative endocrine ROS    Renal/GU negative Renal ROS  negative genitourinary   Musculoskeletal   Abdominal   Peds  Hematology negative hematology ROS (+)   Anesthesia Other Findings Past Medical History: No date: Asthma No date: Bilateral lower extremity edema No date: 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 No date: Depressive disorder No date: Hypertension No date: Lymphedema Past Surgical History: No date: MOUTH SURGERY No date: TUBAL LIGATION BMI    Body Mass Index: 40.10 kg/m     Reproductive/Obstetrics negative OB ROS                             Anesthesia Physical Anesthesia Plan  ASA: 3  Anesthesia Plan: General   Post-op Pain Management:    Induction:   PONV Risk Score and Plan:   Airway Management Planned:   Additional Equipment:   Intra-op Plan:   Post-operative Plan:   Informed Consent: I have reviewed the patients History and Physical, chart, labs and discussed the procedure including the risks, benefits and alternatives for the proposed anesthesia with the patient or authorized representative who has indicated his/her understanding and acceptance.     Dental Advisory Given  Plan Discussed with:  CRNA  Anesthesia Plan Comments:        Anesthesia Quick Evaluation

## 2023-03-19 NOTE — Interval H&P Note (Signed)
 History and Physical Interval Note:  03/19/2023 9:38 AM  Lindsey Chavez  has presented today for surgery, with the diagnosis of Z12.11 (ICD-10-CM) - Colon cancer screening.  The various methods of treatment have been discussed with the patient and family. After consideration of risks, benefits and other options for treatment, the patient has consented to  Procedure(s) with comments: COLONOSCOPY WITH PROPOFOL (N/A) - Victoza & Wegovy as a surgical intervention.  The patient's history has been reviewed, patient examined, no change in status, stable for surgery.  I have reviewed the patient's chart and labs.  Questions were answered to the patient's satisfaction.     [place], [place]

## 2023-03-19 NOTE — H&P (Signed)
 Outpatient short stay form Pre-procedure 03/19/2023 9:37 AM Jermey Closs K. Norma Fredrickson, M.D.  Primary Physician: Karie Fetch, M.D.  Reason for visit: Colon cancer screening  History of present illness: Mrs. Crisp is a 53 year old female with a history of type 2 diabetes mellitus, depression.  Patient denies any lower GI symptoms such as abdominal pain, change in bowel habits, anorexia or involuntary weight loss.    Current Facility-Administered Medications:    0.9 %  sodium chloride infusion, , Intravenous, Continuous, [place], Boykin Nearing, MD, Last Rate: 20 mL/hr at 03/19/23 0856, Continued from Pre-op at 03/19/23 0856  Medications Prior to Admission  Medication Sig Dispense Refill Last Dose/Taking   ARIPiprazole (ABILIFY) 10 MG tablet Take 10 mg by mouth daily.   03/18/2023   budesonide-formoterol (SYMBICORT) 160-4.5 MCG/ACT inhaler Inhale 2 puffs into the lungs 2 (two) times daily.   03/18/2023   doxepin (SINEQUAN) 10 MG capsule Take 10 mg by mouth at bedtime.   03/18/2023   escitalopram (LEXAPRO) 10 MG tablet TAKE 1 TABLET BY MOUTH ONCE DAILY FOR MOOD  2 03/18/2023   pregabalin (LYRICA) 25 MG capsule Take 25 mg by mouth 2 (two) times daily.   03/18/2023   risperiDONE (RISPERDAL) 1 MG tablet Take 1 mg by mouth 2 (two) times daily.   03/18/2023   tiZANidine (ZANAFLEX) 4 MG tablet Take 4 mg by mouth at bedtime.   03/18/2023   busPIRone (BUSPAR) 10 MG tablet Take 10 mg by mouth 3 (three) times daily.      escitalopram (LEXAPRO) 20 MG tablet Take 20 mg by mouth daily.      methocarbamol (ROBAXIN) 500 MG tablet Take 500 mg by mouth 4 (four) times daily.      naproxen (NAPROSYN) 500 MG tablet Take 500 mg by mouth 2 (two) times daily with a meal.      PROAIR HFA 108 (90 Base) MCG/ACT inhaler INHALE 2 PUFFS INTO LUNGS EVERY 4 TO 6 HOURS AS NEEDED FOR WHEEZING  5      Allergies  Allergen Reactions   Pork-Derived Products Hives     Past Medical History:  Diagnosis Date   Asthma    Bilateral lower  extremity edema    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    Depressive disorder    Hypertension    Lymphedema     Review of systems:  Otherwise negative.    Physical Exam  Gen: Alert, oriented. Appears stated age.  HEENT: Olds/AT. PERRLA. Lungs: CTA, no wheezes. CV: RR nl S1, S2. Abd: soft, benign, no masses. BS+ Ext: No edema. Pulses 2+    Planned procedures: Proceed with colonoscopy. The patient understands the nature of the planned procedure, indications, risks, alternatives and potential complications including but not limited to bleeding, infection, perforation, damage to internal organs and possible oversedation/side effects from anesthesia. The patient agrees and gives consent to proceed.  Please refer to procedure notes for findings, recommendations and patient disposition/instructions.     Ernestyne Caldwell K. Norma Fredrickson, M.D. Gastroenterology 03/19/2023  9:37 AM

## 2023-03-20 ENCOUNTER — Encounter: Payer: Self-pay | Admitting: Internal Medicine

## 2023-05-21 ENCOUNTER — Other Ambulatory Visit: Payer: Self-pay | Admitting: Family Medicine

## 2023-05-21 DIAGNOSIS — Z1231 Encounter for screening mammogram for malignant neoplasm of breast: Secondary | ICD-10-CM

## 2023-05-23 ENCOUNTER — Encounter: Payer: Self-pay | Admitting: Internal Medicine

## 2023-05-31 ENCOUNTER — Ambulatory Visit
Admission: RE | Admit: 2023-05-31 | Discharge: 2023-05-31 | Disposition: A | Discharge status: Home / Self Care | Source: Ambulatory Visit | Attending: Family Medicine | Admitting: Family Medicine

## 2023-05-31 DIAGNOSIS — Z1231 Encounter for screening mammogram for malignant neoplasm of breast: Secondary | ICD-10-CM | POA: Insufficient documentation

## 2023-06-04 ENCOUNTER — Ambulatory Visit: Admission: RE | Admit: 2023-06-04 | Source: Home / Self Care | Admitting: Internal Medicine

## 2023-06-04 SURGERY — COLONOSCOPY
Anesthesia: General

## 2023-09-06 ENCOUNTER — Encounter: Payer: Self-pay | Admitting: Internal Medicine

## 2023-09-10 ENCOUNTER — Ambulatory Visit: Admission: RE | Admit: 2023-09-10 | Source: Home / Self Care | Admitting: Internal Medicine

## 2023-09-10 SURGERY — COLONOSCOPY
Anesthesia: General

## 2023-09-19 ENCOUNTER — Encounter: Payer: Self-pay | Admitting: *Deleted

## 2023-10-15 ENCOUNTER — Ambulatory Visit: Admission: RE | Admit: 2023-10-15 | Source: Home / Self Care

## 2023-10-15 HISTORY — DX: Dyspnea, unspecified: R06.00

## 2023-10-15 HISTORY — DX: Other chest pain: R07.89

## 2023-10-15 HISTORY — DX: Elevated blood-pressure reading, without diagnosis of hypertension: R03.0

## 2023-10-15 SURGERY — COLONOSCOPY
Anesthesia: General

## 2023-11-06 ENCOUNTER — Encounter: Payer: Self-pay | Admitting: *Deleted

## 2023-11-11 ENCOUNTER — Encounter: Payer: Self-pay | Admitting: *Deleted

## 2023-11-12 ENCOUNTER — Encounter
Admission: RE | Disposition: A | Discharge status: Home / Self Care | Payer: Self-pay | Source: Home / Self Care | Attending: Gastroenterology

## 2023-11-12 ENCOUNTER — Ambulatory Visit
Admission: RE | Admit: 2023-11-12 | Discharge: 2023-11-12 | Disposition: A | Discharge status: Home / Self Care | Attending: Gastroenterology | Admitting: Gastroenterology

## 2023-11-12 ENCOUNTER — Ambulatory Visit: Admitting: Anesthesiology

## 2023-11-12 ENCOUNTER — Other Ambulatory Visit: Payer: Self-pay

## 2023-11-12 DIAGNOSIS — I10 Essential (primary) hypertension: Secondary | ICD-10-CM | POA: Insufficient documentation

## 2023-11-12 DIAGNOSIS — Z79890 Hormone replacement therapy (postmenopausal): Secondary | ICD-10-CM | POA: Insufficient documentation

## 2023-11-12 DIAGNOSIS — Z791 Long term (current) use of non-steroidal anti-inflammatories (NSAID): Secondary | ICD-10-CM | POA: Insufficient documentation

## 2023-11-12 DIAGNOSIS — R0602 Shortness of breath: Secondary | ICD-10-CM | POA: Diagnosis not present

## 2023-11-12 DIAGNOSIS — Z79899 Other long term (current) drug therapy: Secondary | ICD-10-CM | POA: Diagnosis not present

## 2023-11-12 DIAGNOSIS — F32A Depression, unspecified: Secondary | ICD-10-CM | POA: Insufficient documentation

## 2023-11-12 DIAGNOSIS — E039 Hypothyroidism, unspecified: Secondary | ICD-10-CM | POA: Diagnosis not present

## 2023-11-12 DIAGNOSIS — Z7951 Long term (current) use of inhaled steroids: Secondary | ICD-10-CM | POA: Diagnosis not present

## 2023-11-12 DIAGNOSIS — J45909 Unspecified asthma, uncomplicated: Secondary | ICD-10-CM | POA: Insufficient documentation

## 2023-11-12 DIAGNOSIS — E669 Obesity, unspecified: Secondary | ICD-10-CM | POA: Insufficient documentation

## 2023-11-12 DIAGNOSIS — Z1211 Encounter for screening for malignant neoplasm of colon: Secondary | ICD-10-CM | POA: Insufficient documentation

## 2023-11-12 DIAGNOSIS — Z6838 Body mass index (BMI) 38.0-38.9, adult: Secondary | ICD-10-CM | POA: Insufficient documentation

## 2023-11-12 HISTORY — PX: COLONOSCOPY: SHX5424

## 2023-11-12 SURGERY — COLONOSCOPY
Anesthesia: General

## 2023-11-12 MED ORDER — DEXMEDETOMIDINE HCL IN NACL 80 MCG/20ML IV SOLN
INTRAVENOUS | Status: DC | PRN
  Administered 2023-11-12: 8 ug via INTRAVENOUS
  Administered 2023-11-12: 12 ug via INTRAVENOUS

## 2023-11-12 MED ORDER — LIDOCAINE HCL (CARDIAC) PF 100 MG/5ML IV SOSY
PREFILLED_SYRINGE | INTRAVENOUS | Status: DC | PRN
  Administered 2023-11-12: 80 mg via INTRAVENOUS

## 2023-11-12 MED ORDER — SODIUM CHLORIDE 0.9 % IV SOLN: INTRAVENOUS | Status: DC

## 2023-11-12 MED ORDER — PROPOFOL 1000 MG/100ML IV EMUL
INTRAVENOUS | Status: AC
  Filled 2023-11-12: qty 100

## 2023-11-12 MED ORDER — DEXMEDETOMIDINE HCL IN NACL 80 MCG/20ML IV SOLN
INTRAVENOUS | Status: AC
  Filled 2023-11-12: qty 20

## 2023-11-12 MED ORDER — PROPOFOL 10 MG/ML IV BOLUS
INTRAVENOUS | Status: DC | PRN
  Administered 2023-11-12 (×2): 50 mg via INTRAVENOUS

## 2023-11-12 MED ORDER — LIDOCAINE HCL (PF) 2 % IJ SOLN
INTRAMUSCULAR | Status: AC
Start: 2023-11-12 — End: 2023-11-12
  Filled 2023-11-12: qty 5

## 2023-11-12 MED ORDER — PROPOFOL 500 MG/50ML IV EMUL
INTRAVENOUS | Status: DC | PRN
  Administered 2023-11-12: 50 ug/kg/min via INTRAVENOUS

## 2023-11-12 NOTE — Op Note (Signed)
 [facility] Gastroenterology Patient Name: Edit Ricciardelli Procedure Date: 11/12/2023 7:43 AM MRN: [id number] Account #: [id number] Date of Birth: [birth year] Admit Type: Outpatient Age: 53 Room: [facility] ENDO ROOM 1 Gender: Female Note Status: Finalized Instrument Name: Colon Scope [id number] Procedure:             Colonoscopy Indications:           Screening for colorectal malignant neoplasm Providers:             Ole Schick MD, MD Referring MD:          Maxie LABOR. Aycock MD (Referring MD) Medicines:             Monitored Anesthesia Care Complications:         No immediate complications. Procedure:             Pre-Anesthesia Assessment:                        - Prior to the procedure, a History and Physical was                         performed, and patient medications and allergies were                         reviewed. The patient is competent. The risks and                         benefits of the procedure and the sedation options and                         risks were discussed with the patient. All questions                         were answered and informed consent was obtained.                         Patient identification and proposed procedure were                         verified by the physician, the nurse, the                         anesthesiologist, the anesthetist and the technician                         in the endoscopy suite. Mental Status Examination:                         alert and oriented. Airway Examination: normal                         oropharyngeal airway and neck mobility. Respiratory                         Examination: clear to auscultation. CV Examination:                         normal. Prophylactic Antibiotics: The patient does not  require prophylactic antibiotics. Prior                         Anticoagulants: The patient has taken no anticoagulant                         or antiplatelet agents. ASA Grade  Assessment: II - A                         patient with mild systemic disease. After reviewing                         the risks and benefits, the patient was deemed in                         satisfactory condition to undergo the procedure. The                         anesthesia plan was to use monitored anesthesia care                         (MAC). Immediately prior to administration of                         medications, the patient was re-assessed for adequacy                         to receive sedatives. The heart rate, respiratory                         rate, oxygen saturations, blood pressure, adequacy of                         pulmonary ventilation, and response to care were                         monitored throughout the procedure. The physical                         status of the patient was re-assessed after the                         procedure.                        After obtaining informed consent, the colonoscope was                         passed under direct vision. Throughout the procedure,                         the patient's blood pressure, pulse, and oxygen                         saturations were monitored continuously. The                         Colonoscope was introduced through the anus and  advanced to the the cecum, identified by appendiceal                         orifice and ileocecal valve. The colonoscopy was                         performed without difficulty. The patient tolerated                         the procedure well. The quality of the bowel                         preparation was good. The ileocecal valve, appendiceal                         orifice, and rectum were photographed. Findings:      The perianal and digital rectal examinations were normal.      The entire examined colon appeared normal on direct and retroflexion       views. Impression:            - The entire examined colon is normal on direct and                          retroflexion views.                        - No specimens collected. Recommendation:        - Discharge patient to home.                        - Resume previous diet.                        - Continue present medications.                        - Repeat colonoscopy in 10 years for screening                         purposes.                        - Return to referring physician as previously                         scheduled. Procedure Code(s):     --- Professional ---                        H9878, Colorectal cancer screening; colonoscopy on                         individual not meeting criteria for high risk Diagnosis Code(s):     --- Professional ---                        Z12.11, Encounter for screening for malignant neoplasm                         of colon CPT copyright 2022 [employer]. All rights reserved. The codes documented in this report  are preliminary and upon coder review may  be revised to meet current compliance requirements. Ole Schick MD, MD 11/12/2023 8:13:30 AM Number of Addenda: 0 Note Initiated On: 11/12/2023 7:43 AM Scope Withdrawal Time: 0 hours 8 minutes 49 seconds  Total Procedure Duration: 0 hours 13 minutes 38 seconds  Estimated Blood Loss:  Estimated blood loss: none.      [facility]

## 2023-11-12 NOTE — Transfer of Care (Signed)
 Immediate Anesthesia Transfer of Care Note  Patient: Lindsey Chavez  Procedure(s) Performed: COLONOSCOPY  Patient Location: PACU  Anesthesia Type:General  Level of Consciousness: sedated  Airway & Oxygen Therapy: Patient Spontanous Breathing  Post-op Assessment: Report given to RN and Post -op Vital signs reviewed and stable  Post vital signs: Reviewed and stable  Last Vitals:  Vitals Value Taken Time  BP    Temp    Pulse    Resp    SpO2      Last Pain:  Vitals:   11/12/23 0719  TempSrc: Tympanic         Complications: No notable events documented.

## 2023-11-12 NOTE — H&P (Signed)
 Outpatient short stay form Pre-procedure 11/12/2023  Lindsey ONEIDA Schick, MD  Primary Physician: Lorel Maxie LABOR, MD  Reason for visit:  Screening  History of present illness:    53 y/o lady with history of hypertension, obesity, and hypothyroidism here for screening colonoscopy. Had colonoscopy in March of this year with poor prep. No blood thinners. No family history of GI malignancies. History of c-section.    Current Facility-Administered Medications:    0.9 %  sodium chloride  infusion, , Intravenous, Continuous, Keary Hanak, Lindsey ONEIDA, MD, Last Rate: 20 mL/hr at 11/12/23 0726, New Bag at 11/12/23 0726  Medications Prior to Admission  Medication Sig Dispense Refill Last Dose/Taking   budesonide-formoterol (SYMBICORT) 160-4.5 MCG/ACT inhaler Inhale 2 puffs into the lungs 2 (two) times daily.   Past Week   busPIRone (BUSPAR) 10 MG tablet Take 10 mg by mouth 3 (three) times daily.   Past Week   hydrochlorothiazide (HYDRODIURIL) 12.5 MG tablet Take 12.5 mg by mouth daily.   Past Week   levothyroxine (SYNTHROID) 25 MCG tablet Take 25 mcg by mouth daily before breakfast.   Past Week   methocarbamol (ROBAXIN) 500 MG tablet Take 500 mg by mouth 4 (four) times daily.   Past Week   risperiDONE (RISPERDAL) 1 MG tablet Take 1 mg by mouth 2 (two) times daily.   Past Week   semaglutide-weight management (WEGOVY) 0.5 MG/0.5ML SOAJ SQ injection Inject 0.5 mg into the skin once a week.   Taking   tiZANidine (ZANAFLEX) 4 MG tablet Take 4 mg by mouth at bedtime.   Past Week   ARIPiprazole (ABILIFY) 10 MG tablet Take 10 mg by mouth daily.      doxepin (SINEQUAN) 10 MG capsule Take 10 mg by mouth at bedtime.      escitalopram (LEXAPRO) 10 MG tablet TAKE 1 TABLET BY MOUTH ONCE DAILY FOR MOOD  2    escitalopram (LEXAPRO) 20 MG tablet Take 20 mg by mouth daily.      naproxen (NAPROSYN) 500 MG tablet Take 500 mg by mouth 2 (two) times daily with a meal.      pregabalin (LYRICA) 25 MG capsule Take 25 mg by mouth  2 (two) times daily.      PROAIR HFA 108 (90 Base) MCG/ACT inhaler INHALE 2 PUFFS INTO LUNGS EVERY 4 TO 6 HOURS AS NEEDED FOR WHEEZING  5      Allergies  Allergen Reactions   Porcine (Pork) Protein-Containing Drug Products Hives     Past Medical History:  Diagnosis Date   Asthma    Bilateral lower extremity edema    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    Depressive disorder    Dyspnea    Elevated blood pressure reading without diagnosis of hypertension    Hypertension    Lymphedema    Nonexertional chest pain     Review of systems:  Otherwise negative.    Physical Exam  Gen: Alert, oriented. Appears stated age.  HEENT: PERRLA. Lungs: No respiratory distress CV: RRR Abd: soft, benign, no masses Ext: No edema    Planned procedures: Proceed with colonoscopy. The patient understands the nature of the planned procedure, indications, risks, alternatives and potential complications including but not limited to bleeding, infection, perforation, damage to internal organs and possible oversedation/side effects from anesthesia. The patient agrees and gives consent to proceed.  Please refer to procedure notes for findings, recommendations and patient disposition/instructions.  Lindsey ONEIDA Schick, MD [facility] Gastroenterology

## 2023-11-12 NOTE — Anesthesia Postprocedure Evaluation (Signed)
 Anesthesia Post Note  Patient: Lindsey Chavez  Procedure(s) Performed: COLONOSCOPY  Patient location during evaluation: PACU Anesthesia Type: General Level of consciousness: awake and alert Pain management: pain level controlled Vital Signs Assessment: post-procedure vital signs reviewed and stable Respiratory status: spontaneous breathing, nonlabored ventilation, respiratory function stable and patient connected to nasal cannula oxygen Cardiovascular status: blood pressure returned to baseline and stable Postop Assessment: no apparent nausea or vomiting Anesthetic complications: no   No notable events documented.   Last Vitals:  Vitals:   11/12/23 0807 11/12/23 0817  BP: (!) 101/48 (!) 98/59  Pulse: 79 69  Resp: 19 17  Temp: (!) 35.9 C   SpO2: 100% 100%    Last Pain:  Vitals:   11/12/23 0817  TempSrc:   PainSc: 0-No pain                 [place] KANDICE Clause

## 2023-11-12 NOTE — Anesthesia Preprocedure Evaluation (Signed)
 Anesthesia Evaluation  Patient identified by MRN, date of birth, ID band Patient awake    Reviewed: Allergy & Precautions, H&P , NPO status , Patient's Chart, lab work & pertinent test results, reviewed documented beta blocker date and time   Airway Mallampati: II   Neck ROM: full    Dental  (+) Poor Dentition   Pulmonary shortness of breath and with exertion, asthma    Pulmonary exam normal        Cardiovascular hypertension, negative cardio ROS Normal cardiovascular exam Rhythm:regular Rate:Normal     Neuro/Psych  PSYCHIATRIC DISORDERS  Depression    negative neurological ROS     GI/Hepatic negative GI ROS, Neg liver ROS,,,  Endo/Other  negative endocrine ROS    Renal/GU negative Renal ROS  negative genitourinary   Musculoskeletal   Abdominal   Peds  Hematology negative hematology ROS (+)   Anesthesia Other Findings Past Medical History: No date: Asthma No date: Bilateral lower extremity edema No date: 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 No date: 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 No date: Depressive disorder No date: Dyspnea No date: Elevated blood pressure reading without diagnosis of  hypertension No date: Hypertension No date: Lymphedema No date: Nonexertional chest pain Past Surgical History: 03/19/2023: COLONOSCOPY WITH PROPOFOL ; N/A     Comment:  Procedure: COLONOSCOPY WITH PROPOFOL ;  Surgeon: [place],               Ladell POUR, MD;  Location: [facility] ENDOSCOPY;  Service:               Gastroenterology;  Laterality: N/A;  Victoza & Wegovy No date: MOUTH SURGERY No date: TUBAL LIGATION   Reproductive/Obstetrics negative OB ROS                              Anesthesia Physical Anesthesia Plan  ASA: 2  Anesthesia Plan: General   Post-op Pain Management:    Induction:   PONV Risk Score and Plan:   Airway Management Planned:   Additional  Equipment:   Intra-op Plan:   Post-operative Plan:   Informed Consent: I have reviewed the patients History and Physical, chart, labs and discussed the procedure including the risks, benefits and alternatives for the proposed anesthesia with the patient or authorized representative who has indicated his/her understanding and acceptance.     Dental Advisory Given  Plan Discussed with: CRNA  Anesthesia Plan Comments:         Anesthesia Quick Evaluation

## 2023-11-12 NOTE — Interval H&P Note (Signed)
 History and Physical Interval Note:  11/12/2023 7:44 AM  Lindsey Chavez  has presented today for surgery, with the diagnosis of Colon cancer screening (Z12.11).  The various methods of treatment have been discussed with the patient and family. After consideration of risks, benefits and other options for treatment, the patient has consented to  Procedure(s): COLONOSCOPY (N/A) as a surgical intervention.  The patient's history has been reviewed, patient examined, no change in status, stable for surgery.  I have reviewed the patient's chart and labs.  Questions were answered to the patient's satisfaction.     Ole ONEIDA Schick  Ok to proceed with colonoscopy

## 2024-02-06 ENCOUNTER — Ambulatory Visit: Admitting: Podiatry

## 2024-02-10 ENCOUNTER — Other Ambulatory Visit: Payer: Self-pay

## 2024-02-10 ENCOUNTER — Emergency Department
Admission: EM | Admit: 2024-02-10 | Discharge: 2024-02-10 | Disposition: A | Discharge status: Home / Self Care | Attending: Emergency Medicine | Admitting: Emergency Medicine

## 2024-02-10 DIAGNOSIS — R0981 Nasal congestion: Secondary | ICD-10-CM

## 2024-02-10 DIAGNOSIS — J069 Acute upper respiratory infection, unspecified: Secondary | ICD-10-CM | POA: Insufficient documentation

## 2024-02-10 LAB — RESP PANEL BY RT-PCR (RSV, FLU A&B, COVID)  RVPGX2
Influenza A by PCR: NEGATIVE
Influenza B by PCR: NEGATIVE
Resp Syncytial Virus by PCR: NEGATIVE
SARS Coronavirus 2 by RT PCR: NEGATIVE

## 2024-02-10 NOTE — ED Triage Notes (Signed)
 Pt presents with several day history of cough, congestion, left ear pain, headache, and intermittent fevers.

## 2024-02-11 ENCOUNTER — Ambulatory Visit: Admitting: Podiatry
